# Patient Record
Sex: Male | Born: 1935 | Race: White | Hispanic: No | Marital: Married | Smoking: Never smoker
Health system: Southern US, Community
[De-identification: ages and names within clinical notes are randomized; demographics above are authoritative.]

## PROBLEM LIST (undated history)

## (undated) ENCOUNTER — Emergency Department (HOSPITAL_COMMUNITY): Admission: EM | Payer: Medicare Other

## (undated) DIAGNOSIS — Z9889 Other specified postprocedural states: Secondary | ICD-10-CM

## (undated) DIAGNOSIS — A481 Legionnaires' disease: Secondary | ICD-10-CM

## (undated) DIAGNOSIS — Z7901 Long term (current) use of anticoagulants: Secondary | ICD-10-CM

## (undated) DIAGNOSIS — J189 Pneumonia, unspecified organism: Secondary | ICD-10-CM

## (undated) DIAGNOSIS — I200 Unstable angina: Secondary | ICD-10-CM

## (undated) DIAGNOSIS — I10 Essential (primary) hypertension: Secondary | ICD-10-CM

## (undated) DIAGNOSIS — R0609 Other forms of dyspnea: Secondary | ICD-10-CM

## (undated) DIAGNOSIS — I350 Nonrheumatic aortic (valve) stenosis: Secondary | ICD-10-CM

## (undated) DIAGNOSIS — I2510 Atherosclerotic heart disease of native coronary artery without angina pectoris: Secondary | ICD-10-CM

## (undated) DIAGNOSIS — R011 Cardiac murmur, unspecified: Secondary | ICD-10-CM

## (undated) DIAGNOSIS — R112 Nausea with vomiting, unspecified: Secondary | ICD-10-CM

## (undated) DIAGNOSIS — I209 Angina pectoris, unspecified: Secondary | ICD-10-CM

## (undated) DIAGNOSIS — N200 Calculus of kidney: Secondary | ICD-10-CM

## (undated) DIAGNOSIS — R0600 Dyspnea, unspecified: Secondary | ICD-10-CM

## (undated) DIAGNOSIS — I2699 Other pulmonary embolism without acute cor pulmonale: Secondary | ICD-10-CM

## (undated) DIAGNOSIS — C61 Malignant neoplasm of prostate: Secondary | ICD-10-CM

## (undated) DIAGNOSIS — T4145XA Adverse effect of unspecified anesthetic, initial encounter: Secondary | ICD-10-CM

## (undated) DIAGNOSIS — I739 Peripheral vascular disease, unspecified: Secondary | ICD-10-CM

## (undated) HISTORY — PX: TYMPANIC MEMBRANE REPAIR: SHX294

## (undated) HISTORY — PX: MASTOIDECTOMY: SHX711

## (undated) HISTORY — PX: OTHER SURGICAL HISTORY: SHX169

---

## 1984-07-24 DIAGNOSIS — A481 Legionnaires' disease: Secondary | ICD-10-CM

## 1984-07-24 HISTORY — DX: Legionnaires' disease: A48.1

## 1992-07-24 HISTORY — PX: CORONARY ANGIOPLASTY: SHX604

## 1997-07-24 DIAGNOSIS — T8859XA Other complications of anesthesia, initial encounter: Secondary | ICD-10-CM

## 1997-07-24 HISTORY — PX: PROSTATE SURGERY: SHX751

## 1997-07-24 HISTORY — DX: Other complications of anesthesia, initial encounter: T88.59XA

## 1997-11-18 ENCOUNTER — Encounter: Admission: RE | Admit: 1997-11-18 | Discharge: 1998-02-16 | Payer: Self-pay | Admitting: Radiation Oncology

## 1999-07-25 HISTORY — PX: CHOLECYSTECTOMY: SHX55

## 1999-12-08 ENCOUNTER — Ambulatory Visit (HOSPITAL_COMMUNITY): Admission: RE | Admit: 1999-12-08 | Discharge: 1999-12-09 | Payer: Self-pay | Admitting: Cardiovascular Disease

## 1999-12-08 ENCOUNTER — Encounter: Payer: Self-pay | Admitting: Cardiovascular Disease

## 2000-02-07 ENCOUNTER — Encounter: Payer: Self-pay | Admitting: Emergency Medicine

## 2000-02-07 ENCOUNTER — Inpatient Hospital Stay (HOSPITAL_COMMUNITY): Admission: EM | Admit: 2000-02-07 | Discharge: 2000-02-08 | Payer: Self-pay | Admitting: Emergency Medicine

## 2000-02-12 ENCOUNTER — Encounter: Payer: Self-pay | Admitting: Emergency Medicine

## 2000-02-12 ENCOUNTER — Inpatient Hospital Stay (HOSPITAL_COMMUNITY): Admission: EM | Admit: 2000-02-12 | Discharge: 2000-02-16 | Payer: Self-pay | Admitting: Cardiovascular Disease

## 2000-02-12 ENCOUNTER — Encounter (INDEPENDENT_AMBULATORY_CARE_PROVIDER_SITE_OTHER): Payer: Self-pay | Admitting: Specialist

## 2000-02-13 ENCOUNTER — Encounter: Payer: Self-pay | Admitting: Cardiovascular Disease

## 2000-02-23 ENCOUNTER — Ambulatory Visit (HOSPITAL_COMMUNITY): Admission: RE | Admit: 2000-02-23 | Discharge: 2000-02-23 | Payer: Self-pay | Admitting: General Surgery

## 2000-02-23 ENCOUNTER — Emergency Department (HOSPITAL_COMMUNITY): Admission: EM | Admit: 2000-02-23 | Discharge: 2000-02-23 | Payer: Self-pay | Admitting: Emergency Medicine

## 2000-02-23 ENCOUNTER — Encounter: Payer: Self-pay | Admitting: General Surgery

## 2002-08-11 ENCOUNTER — Encounter: Admission: RE | Admit: 2002-08-11 | Discharge: 2002-08-11 | Payer: Self-pay | Admitting: Urology

## 2002-08-11 ENCOUNTER — Encounter: Payer: Self-pay | Admitting: Urology

## 2002-08-14 ENCOUNTER — Encounter: Admission: RE | Admit: 2002-08-14 | Discharge: 2002-08-14 | Payer: Self-pay | Admitting: Urology

## 2002-08-14 ENCOUNTER — Encounter: Payer: Self-pay | Admitting: Urology

## 2004-07-24 HISTORY — PX: LITHOTRIPSY: SUR834

## 2005-01-11 ENCOUNTER — Inpatient Hospital Stay (HOSPITAL_COMMUNITY): Admission: EM | Admit: 2005-01-11 | Discharge: 2005-01-13 | Payer: Self-pay | Admitting: Emergency Medicine

## 2005-04-03 ENCOUNTER — Ambulatory Visit (HOSPITAL_COMMUNITY): Admission: RE | Admit: 2005-04-03 | Discharge: 2005-04-03 | Payer: Self-pay | Admitting: Urology

## 2005-11-17 ENCOUNTER — Emergency Department (HOSPITAL_COMMUNITY): Admission: EM | Admit: 2005-11-17 | Discharge: 2005-11-17 | Payer: Self-pay | Admitting: Emergency Medicine

## 2008-07-30 ENCOUNTER — Emergency Department (HOSPITAL_COMMUNITY): Admission: EM | Admit: 2008-07-30 | Discharge: 2008-07-30 | Payer: Self-pay | Admitting: Emergency Medicine

## 2008-07-30 ENCOUNTER — Emergency Department (HOSPITAL_COMMUNITY): Admission: EM | Admit: 2008-07-30 | Discharge: 2008-07-31 | Payer: Self-pay | Admitting: Emergency Medicine

## 2008-08-28 ENCOUNTER — Inpatient Hospital Stay (HOSPITAL_COMMUNITY): Admission: EM | Admit: 2008-08-28 | Discharge: 2008-08-30 | Payer: Self-pay | Admitting: Emergency Medicine

## 2008-09-23 ENCOUNTER — Encounter: Payer: Self-pay | Admitting: Pulmonary Disease

## 2008-10-28 ENCOUNTER — Ambulatory Visit (HOSPITAL_COMMUNITY): Admission: RE | Admit: 2008-10-28 | Discharge: 2008-10-28 | Payer: Self-pay | Admitting: Pulmonary Disease

## 2008-10-28 ENCOUNTER — Encounter: Payer: Self-pay | Admitting: Pulmonary Disease

## 2008-10-28 ENCOUNTER — Ambulatory Visit: Payer: Self-pay | Admitting: Vascular Surgery

## 2008-10-28 ENCOUNTER — Ambulatory Visit: Payer: Self-pay | Admitting: Pulmonary Disease

## 2008-10-28 DIAGNOSIS — I2699 Other pulmonary embolism without acute cor pulmonale: Secondary | ICD-10-CM

## 2008-10-28 DIAGNOSIS — I2789 Other specified pulmonary heart diseases: Secondary | ICD-10-CM

## 2008-10-28 DIAGNOSIS — E785 Hyperlipidemia, unspecified: Secondary | ICD-10-CM

## 2008-10-28 DIAGNOSIS — I10 Essential (primary) hypertension: Secondary | ICD-10-CM

## 2008-11-02 ENCOUNTER — Ambulatory Visit: Payer: Self-pay | Admitting: Oncology

## 2008-11-13 LAB — CBC WITH DIFFERENTIAL/PLATELET
BASO%: 0.6 % (ref 0.0–2.0)
Basophils Absolute: 0 10e3/uL (ref 0.0–0.1)
EOS%: 2.2 % (ref 0.0–7.0)
Eosinophils Absolute: 0.1 10e3/uL (ref 0.0–0.5)
HCT: 43.5 % (ref 38.4–49.9)
HGB: 15.2 g/dL (ref 13.0–17.1)
LYMPH%: 25.3 % (ref 14.0–49.0)
MCH: 32.5 pg (ref 27.2–33.4)
MCHC: 35 g/dL (ref 32.0–36.0)
MCV: 92.9 fL (ref 79.3–98.0)
MONO#: 0.4 10e3/uL (ref 0.1–0.9)
MONO%: 8.4 % (ref 0.0–14.0)
NEUT#: 3.3 10e3/uL (ref 1.5–6.5)
NEUT%: 63.5 % (ref 39.0–75.0)
Platelets: 168 10e3/uL (ref 140–400)
RBC: 4.68 10e6/uL (ref 4.20–5.82)
RDW: 13.2 % (ref 11.0–14.6)
WBC: 5.2 10e3/uL (ref 4.0–10.3)
lymph#: 1.3 10e3/uL (ref 0.9–3.3)

## 2008-11-16 LAB — PROTEIN C ACTIVITY: Protein C Activity: <15 % (ref 75–133)

## 2008-11-16 LAB — PROTEIN C, TOTAL: Protein C, Total: 57 % — ABNORMAL LOW (ref 70–140)

## 2008-12-25 ENCOUNTER — Encounter: Admission: RE | Admit: 2008-12-25 | Discharge: 2008-12-25 | Payer: Self-pay | Admitting: Family Medicine

## 2009-07-09 ENCOUNTER — Emergency Department (HOSPITAL_COMMUNITY): Admission: EM | Admit: 2009-07-09 | Discharge: 2009-07-09 | Payer: Self-pay | Admitting: Emergency Medicine

## 2009-09-27 ENCOUNTER — Ambulatory Visit (HOSPITAL_COMMUNITY): Admission: RE | Admit: 2009-09-27 | Discharge: 2009-09-27 | Payer: Self-pay | Admitting: Gastroenterology

## 2010-05-19 ENCOUNTER — Encounter: Admission: RE | Admit: 2010-05-19 | Discharge: 2010-05-19 | Payer: Self-pay | Admitting: Family Medicine

## 2010-10-17 LAB — COMPREHENSIVE METABOLIC PANEL
Alkaline Phosphatase: 46 U/L (ref 39–117)
BUN: 13 mg/dL (ref 6–23)
Chloride: 106 mEq/L (ref 96–112)
Creatinine, Ser: 1.02 mg/dL (ref 0.4–1.5)
Glucose, Bld: 129 mg/dL — ABNORMAL HIGH (ref 70–99)
Potassium: 4 mEq/L (ref 3.5–5.1)
Total Bilirubin: 1.1 mg/dL (ref 0.3–1.2)
Total Protein: 7.1 g/dL (ref 6.0–8.3)

## 2010-10-17 LAB — PROTIME-INR
INR: 1.12 (ref 0.00–1.49)
Prothrombin Time: 14.3 seconds (ref 11.6–15.2)

## 2010-10-17 LAB — TYPE AND SCREEN: ABO/RH(D): AB POS

## 2010-10-17 LAB — CBC
HCT: 45.7 % (ref 39.0–52.0)
Hemoglobin: 16 g/dL (ref 13.0–17.0)
MCHC: 34.9 g/dL (ref 30.0–36.0)
RBC: 4.76 MIL/uL (ref 4.22–5.81)
RDW: 11.8 % (ref 11.5–15.5)

## 2010-10-17 LAB — DIFFERENTIAL
Basophils Relative: 1 % (ref 0–1)
Eosinophils Absolute: 0.1 10*3/uL (ref 0.0–0.7)
Neutro Abs: 2.4 10*3/uL (ref 1.7–7.7)
Neutrophils Relative %: 59 % (ref 43–77)

## 2010-10-24 LAB — DIFFERENTIAL
Basophils Absolute: 0 10*3/uL (ref 0.0–0.1)
Basophils Relative: 0 % (ref 0–1)
Eosinophils Absolute: 0.1 10*3/uL (ref 0.0–0.7)
Eosinophils Relative: 1 % (ref 0–5)
Monocytes Absolute: 0.7 10*3/uL (ref 0.1–1.0)

## 2010-10-24 LAB — CBC
HCT: 35.8 % — ABNORMAL LOW (ref 39.0–52.0)
Hemoglobin: 12.4 g/dL — ABNORMAL LOW (ref 13.0–17.0)
MCHC: 34.8 g/dL (ref 30.0–36.0)
MCV: 96.9 fL (ref 78.0–100.0)
RDW: 12.4 % (ref 11.5–15.5)

## 2010-11-07 LAB — BASIC METABOLIC PANEL
BUN: 15 mg/dL (ref 6–23)
Calcium: 9.1 mg/dL (ref 8.4–10.5)
Creatinine, Ser: 1.21 mg/dL (ref 0.4–1.5)
GFR calc non Af Amer: 59 mL/min — ABNORMAL LOW (ref >60)
Glucose, Bld: 181 mg/dL — ABNORMAL HIGH (ref 70–99)
Potassium: 4.1 mEq/L (ref 3.5–5.1)

## 2010-11-07 LAB — URINE MICROSCOPIC-ADD ON

## 2010-11-07 LAB — DIFFERENTIAL
Basophils Absolute: 0 10*3/uL (ref 0.0–0.1)
Basophils Relative: 0 % (ref 0–1)
Eosinophils Relative: 1 % (ref 0–5)
Monocytes Absolute: 0.7 10*3/uL (ref 0.1–1.0)
Neutro Abs: 5.8 10*3/uL (ref 1.7–7.7)

## 2010-11-07 LAB — URINE CULTURE

## 2010-11-07 LAB — URINALYSIS, ROUTINE W REFLEX MICROSCOPIC
Glucose, UA: 100 mg/dL — AB
Hgb urine dipstick: NEGATIVE
Specific Gravity, Urine: 1.027 (ref 1.005–1.030)
Urobilinogen, UA: 1 mg/dL (ref 0.0–1.0)

## 2010-11-07 LAB — CBC
Hemoglobin: 15.4 g/dL (ref 13.0–17.0)
MCHC: 34.1 g/dL (ref 30.0–36.0)
Platelets: 163 10*3/uL (ref 150–400)
RDW: 12.3 % (ref 11.5–15.5)

## 2010-11-07 LAB — POCT CARDIAC MARKERS: CKMB, poc: <1 ng/mL — ABNORMAL LOW (ref 1.0–8.0)

## 2010-11-08 LAB — CBC
HCT: 40.7 % (ref 39.0–52.0)
HCT: 46.9 % (ref 39.0–52.0)
Hemoglobin: 16.3 g/dL (ref 13.0–17.0)
MCHC: 34.7 g/dL (ref 30.0–36.0)
MCV: 95.3 fL (ref 78.0–100.0)
Platelets: 121 10*3/uL — ABNORMAL LOW (ref 150–400)
RBC: 4.93 MIL/uL (ref 4.22–5.81)
RDW: 12.4 % (ref 11.5–15.5)
WBC: 4.4 10*3/uL (ref 4.0–10.5)
WBC: 6 10*3/uL (ref 4.0–10.5)

## 2010-11-08 LAB — DIFFERENTIAL
Basophils Relative: 0 % (ref 0–1)
Eosinophils Absolute: 0.1 10*3/uL (ref 0.0–0.7)
Eosinophils Relative: 2 % (ref 0–5)
Lymphs Abs: 1 10*3/uL (ref 0.7–4.0)
Monocytes Absolute: 0.5 10*3/uL (ref 0.1–1.0)
Monocytes Relative: 9 % (ref 3–12)

## 2010-11-08 LAB — LIPID PANEL
Cholesterol: 138 mg/dL (ref 0–200)
HDL: 38 mg/dL — ABNORMAL LOW (ref >39)
LDL Cholesterol: 88 mg/dL (ref 0–99)
Total CHOL/HDL Ratio: 3.6 RATIO
Triglycerides: 60 mg/dL (ref <150)

## 2010-11-08 LAB — URINE CULTURE: Colony Count: NO GROWTH

## 2010-11-08 LAB — PROTIME-INR
INR: 1.2 (ref 0.00–1.49)
Prothrombin Time: 16.7 seconds — ABNORMAL HIGH (ref 11.6–15.2)

## 2010-11-08 LAB — URINALYSIS, ROUTINE W REFLEX MICROSCOPIC
Bilirubin Urine: NEGATIVE
Hgb urine dipstick: NEGATIVE
Nitrite: NEGATIVE
Specific Gravity, Urine: 1.01 (ref 1.005–1.030)
Urobilinogen, UA: 0.2 mg/dL (ref 0.0–1.0)
pH: 6 (ref 5.0–8.0)

## 2010-11-08 LAB — PROTEIN S, TOTAL: Protein S Ag, Total: 97 % (ref 70–140)

## 2010-11-08 LAB — POCT CARDIAC MARKERS
CKMB, poc: <1 ng/mL — ABNORMAL LOW (ref 1.0–8.0)
Troponin i, poc: <0.05 ng/mL (ref 0.00–0.09)
Troponin i, poc: <0.05 ng/mL (ref 0.00–0.09)

## 2010-11-08 LAB — COMPREHENSIVE METABOLIC PANEL
ALT: 19 U/L (ref 0–53)
AST: 19 U/L (ref 0–37)
Calcium: 8.7 mg/dL (ref 8.4–10.5)
GFR calc Af Amer: >60 mL/min (ref >60)
Sodium: 138 mEq/L (ref 135–145)
Total Protein: 5.9 g/dL — ABNORMAL LOW (ref 6.0–8.3)

## 2010-11-08 LAB — CARDIOLIPIN ANTIBODIES, IGG, IGM, IGA: Anticardiolipin IgA: 9 [APL'U] — ABNORMAL LOW (ref <13)

## 2010-11-08 LAB — POCT I-STAT, CHEM 8
Chloride: 103 mEq/L (ref 96–112)
Creatinine, Ser: 1.1 mg/dL (ref 0.4–1.5)
Glucose, Bld: 108 mg/dL — ABNORMAL HIGH (ref 70–99)
Hemoglobin: 16.3 g/dL (ref 13.0–17.0)
Potassium: 4.5 mEq/L (ref 3.5–5.1)
Sodium: 136 mEq/L (ref 135–145)

## 2010-11-08 LAB — PROTEIN C ACTIVITY: Protein C Activity: 129 % (ref 75–133)

## 2010-11-08 LAB — CARDIAC PANEL(CRET KIN+CKTOT+MB+TROPI)
CK, MB: 1.6 ng/mL (ref 0.3–4.0)
CK, MB: 1.9 ng/mL (ref 0.3–4.0)
Relative Index: INVALID (ref 0.0–2.5)
Relative Index: INVALID (ref 0.0–2.5)
Troponin I: 0.03 ng/mL (ref 0.00–0.06)

## 2010-11-08 LAB — BETA-2-GLYCOPROTEIN I ABS, IGG/M/A
Beta-2 Glyco I IgG: <4 U/mL (ref <20)
Beta-2-Glycoprotein I IgA: <4 U/mL (ref <10)
Beta-2-Glycoprotein I IgM: <4 U/mL (ref <10)

## 2010-11-08 LAB — PROTEIN C, TOTAL: Protein C, Total: 64 % — ABNORMAL LOW (ref 70–140)

## 2010-11-08 LAB — FACTOR 5 LEIDEN

## 2010-11-08 LAB — CK TOTAL AND CKMB (NOT AT ARMC): Relative Index: INVALID (ref 0.0–2.5)

## 2010-11-08 LAB — LUPUS ANTICOAGULANT PANEL: Lupus Anticoagulant: NOT DETECTED

## 2010-11-08 LAB — ANTITHROMBIN III: AntiThromb III Func: 209 % — ABNORMAL HIGH (ref 76–126)

## 2010-11-08 LAB — PROTEIN S ACTIVITY: Protein S Activity: 126 % (ref 69–129)

## 2010-12-06 NOTE — Discharge Summary (Signed)
Curtis Brennan, Curtis Brennan              ACCOUNT NO.:  [id number]   MEDICAL RECORD NO.:  [id number]          PATIENT TYPE:  INP   LOCATION:  4711                         FACILITY:  [facility]   PHYSICIAN:  Curtis Brennan, M.D. DATE OF BIRTH:  11/10/1931   DATE OF ADMISSION:  08/28/2008  DATE OF DISCHARGE:  08/30/2008                               DISCHARGE SUMMARY   PRIMARY CARE PHYSICIAN:  Curtis Puffer, MD in [place], [place].   DISCHARGE DIAGNOSES:  1. A large clot burden.  2. Bilateral pulmonary embolism.  3. Right ear infection.  4. Coronary artery disease.  5. Hypertension.  6. Hyperlipidemia.  7. Status post cholecystectomy.  8. History of prostate cancer diagnosed in 1988.  9. Status post prostatectomy in 1999.   DISCHARGE MEDICATIONS:  1. Amoxicillin 500 mg twice daily until September 04, 2008.  2. Coumadin 7.5 mg daily until advised differently by Dr. Clarene Brennan.  3. Lovenox 150 mg injected subcutaneously daily until at least      September 01, 2008, or longer as instructed by Dr. Clarene Brennan.  4. Aspirin 81 mg daily.  5. Felodipine ER 10 mg daily.  6. Lisinopril 10 mg daily.  7. Metoprolol 25 mg daily.  8. Tramadol 50 mg every 6 hours as needed for pain.   DISPOSITION AND FOLLOW UP:  The patient is discharged home in stable  condition.  He has maintained good oxygenation and blood pressures while  in the hospital.  He has been advised to call Dr. Clarene Brennan on Monday for  followup appointment as well as for an INR check on Tuesday at the  latest.  His phone number has been provided to the patient in the  discharge instructions.  We will need to be on the Lovenox for at least  5 days overlap and this is to be concluded on Tuesday September 01, 2008.  He may need to be on the Lovenox longer if his INR remained  subtherapeutic.   CONSULTATION:  In this hospitalization, Dr. Ritta Brennan with  [facility].   The patient returned to disposition and followup  at the end and state  that a hypercoagulable panel is pending at time of dictation and should  be followed up by his primary care physician.   Images in this hospitalization include:  1. Chest x-ray on August 28, 2008, that showed a stable mild changes      of COPD and chronic bronchitis.  2. A CT scan of the head on August 28, 2008, that showed bilateral      chronic mastoiditis and chronic otitis media, greater on the right.  3. CT of the orbits on August 28, 2008, again showing the bilateral      chronic mastoiditis and chronic otitis media.  4. A CT angiogram of the chest on August 28, 2008, that shows a      bilateral pulmonary emboli involving branches of the right upper,      middle, and lower lobe pulmonary arteries and the left lower lobe      pulmonary artery.  Clot burden is large.  Numerous  calcified      granulomata throughout both lungs and numerous calcified      mediastinal and hilar lymph nodes consistent with old granulomatous      disease, and a small hiatal hernia.   HISTORY AND PHYSICAL EXAMINATION:  For full details, please refer to  history and physical dictated by Dr. Oralia Brennan on August 28, 2008, but in  brief, Curtis Brennan is a 77 year old Caucasian man who has a history of  coronary artery disease, status post of PTCA who presented with  dizziness, headache, and shortness of breath.  Started with a headache  day prior to admission and then developed a shortness of breath with  exertion.  He did have some chills.  He became concerned, for that  reason he was brought into the hospital.   HOSPITAL COURSE:  Back to problem:  1. PE, which was demonstrated by CT angiogram of the chest.  He was      started on heparin drip as well as on Coumadin.  His heparin has      been transitioned over to Lovenox for ease of administration upon      discharge.  Please note that he must complete 5 days of overlap for      the venous thromboembolism core measure.  This is to be  completed      on Tuesday, September 01, 2008.  He may need a Coumadin for a longer      period of time, depending on his INRs.  His INR upon discharge is      1.3.  He will follow up with Dr. Aida Brennan on Tuesday at the      latest.  He has been provided with instructions on his discharge      sheet.  2. He does have chronic bilateral mastoiditis, an ear infection that      is worse on the right with drainage.  I had started him on      amoxicillin 500 mg twice a day.  This is to be completed on      September 04, 2008.  3. Rest of chronic medical issues were not a problem in this      hospitalization and none of his medications were changed.  4. Vital signs upon discharge, blood pressure 138/90, heart rate 80,      respirations 20, and O2 sats 99% on room air with a temperature of      97.2.   LABS ON DAY OF DISCHARGE:  WBCs 4.4, hemoglobin 14.2, and platelets of  121.      Curtis Brennan, M.D.  Electronically Signed     EH/MEDQ  D:  08/30/2008  T:  08/30/2008  Job:  [id number]   cc:   Nanetta Batty, M.D.

## 2010-12-06 NOTE — H&P (Signed)
NAME:  Curtis Brennan, Curtis Brennan              ACCOUNT NO.:  [id number]   MEDICAL RECORD NO.:  [id number]          PATIENT TYPE:  INP   LOCATION:  1843                         FACILITY:  [facility]   PHYSICIAN:  Darryl D. Prime, MD    DATE OF BIRTH:  23-Jun-1932   DATE OF ADMISSION:  08/28/2008  DATE OF DISCHARGE:                              HISTORY & PHYSICAL   PRIMARY CARE PHYSICIAN:  Dr. Aida Puffer.   CARDIOLOGIST:  Nanetta Batty, M.D.   Patient is the historian as well as his wife and daughters.  They were  good historians.   CHIEF COMPLAINT:  Dizziness, headache, and shortness of breath.   HISTORY OF PRESENT ILLNESS:  Curtis Brennan is a 77 year old male with a  history of coronary artery disease, status post PCI, history of  hypertension, who presents with the above symptoms.  The patient notes  he had a headache yesterday that was significant and then subsequently  developed shortness of breath, particularly with exertion, which is new  today.  He has been relatively sedentary due to the weather here in the  winter, it has been snowing.  Three weeks ago, he did travel, took a 4-  hour trip, but notes he got up periodically out of the car to stretch.   The patient on presentation was concerned more about his shortness of  breath.  He has had mild chest pain, however.  He notes recent chills.  He denies any recent weight loss.  He has a history of chronic  mastoiditis, and his right ear has been draining.  The patient notes his  appetite has been good.  He denies any black stools or bloody stools.  He denies any recent dysuria or hematuria.   The patient is taking his medications; however, he did not bring his  list with him today.   PAST MEDICAL HISTORY:  1. History of coronary artery disease, status post direct LAD      arthrectomy in 1995.  He is also status post balloon angioplasty of      a diagonal branch in 2001.  2. He is also status post cholecystectomy in 2001.  3.  Hypertension.  4. History of kidney stones.  5. History of Legionnaire's disease, pneumonia in 1996.  6. History of chronic mastoiditis since childhood bilaterally,      apparently with recent right ear drainage.  7. History of prostate cancer diagnosed in 1998.  He notes he had a      prostatectomy in 1999.  He had 34 doses of radiation therapy as      well.  8. Hyperlipidemia.   ALLERGIES:  He is allergic SULFA DRUGS.  He is unsure of the reaction.   MEDICATIONS:  He did not bring his list with him.  He has been listed to  be on:  1. Metoprolol.  2. Lisinopril.  3. Felodipine.  4. Baby aspirin.   We will have to confirm his medication list.   SOCIAL HISTORY:  He has never smoked.  No alcohol.  No illicit drug use.  He is retired.  He was previously  self-employed.   FAMILY HISTORY:  Positive for clots that went to the lung in 2  sisters, although I cannot confirm this.   REVIEW OF SYSTEMS:  The 14-point review of systems is negative unless  stated above.   PHYSICAL EXAMINATION:  VITAL SIGNS:  Temperature 98, initial blood  pressure 189/87, pulse 99 to 108, respiratory rate 20 to 22, sats are  95% on 2 liters nasal cannula.  It did drop to 81% on room air.  GENERAL:  He is acutely ill-appearing.  Looks comfortable.  HEENT:  Normocephalic and atraumatic.  Pupils are equal, round and  reactive to light.  Extraocular movements are intact.  Oropharynx  reveals preserved dentition with no posterior pharyngeal lesions.  He is  dry.  NECK:  Supple.  No lymphadenopathy or thyromegaly.  No carotid bruits.  No jugular venous distention.  LUNGS:  Clear to auscultation bilaterally.  CARDIOVASCULAR:  Regular rhythm with a fast rate.  No murmurs, rubs or  gallops.  Normal S1 and S2.  No S3 or S4.  ABDOMEN:  Soft, nontender, nondistended.  No hepatosplenomegaly.  EXTREMITIES:  No clubbing, cyanosis or edema.  NEUROLOGIC:  He is alert and oriented x4 with cranial nerves II-XII  grossly  intact.  Strength and sensation are grossly intact.  SKIN:  No rashes.   LABORATORY DATA:  White count is 6 with hemoglobin of 16.3, hematocrit  46.9, platelets 146, segs 73%.  Cardiac markers drawn in the emergency  room are unremarkable at 1607.  INR was 1.  PT 13.3.  PTT 30.  Urinalysis was negative.  B-type natriuretic peptide less than 30.  I-  STAT showed a CO2 of 24, hemoglobin 16.3, hematocrit 48.  Sodium 136,  potassium 4.5, chloride 103, glucose 108, BUN 16, creatinine 1.1.  Cardiac markers at 1750 were unremarkable.  D-dimer was high at 7.91,  which prompted a CT of the chest.  Preliminary read shows he has  bilateral pulmonary emboli involving branches of the right upper,  middle, and lower pulmonary arteries, and the left lower pulmonary  artery with a large clot burden.  He had noncalcified granular matter in  both lungs, numerous calcified mediastinal and hilar nodes, consistent  with old granulomatous disease.  Superimposed active infection and/or  malignancy could not be excluded, as he did have also enlarged bilateral  hilar and subcarinal mediastinal lymph nodes.  He had a small hiatal  hernia and pancreatic atrophy.   Patient also had a CT of the head because he came in with a headache.  It showed mild diffuse cerebral and cerebellar atrophy, mild chronic  small vessel white matter ischemic changes in both cerebral hemispheres  and bilateral chronic mastoiditis.   The patient's EKG shows normal sinus rhythm at a rate of 96 beats per  minute, P-R interval 188, QRS 118.  Q-T corrected at 490.  There is a  incomplete right bundle branch block.  The right bundle is new, compared  to EKG in June, 2006.   Patient's chest x-ray shows mild changes consistent with chronic  bronchitis.   ASSESSMENT/PLAN:  1. This is a patient with a history of coronary artery disease who has      no glaring precipitant for bilateral pulmonary emboli.  Does have a      history of prostate  cancer, and apparently 3 weeks ago, PSA was      checked, and this is unremarkable.  We will check this again,  however, as the concern here is for possible malignancy causing      this.  His recent sedentary state could also be the etiology.  He      was seen by cardiology here, and they have ordered a coagulation      panel.  He will be on a heparin drip.  Coumadin will be started      eventually.  The patient will most likely need at least 6 months of      this therapy.  He will be observed closely, as he did have oxygen      saturations here in the emergency room on telemetry.  We will get      an echocardiogram to evaluate his right ventricular function      specifically.  We will give a low-dose beta blocker to keep him on      his anti-ischemic therapy and give him hydralazine as needed.  He      will receive gentle hydration.  2. Deep venous thrombosis prophylaxis:  Again, he has had a pulmonary      emboli.  Gastrointestinal prophylaxis with Zantac for a proton pump      inhibitor.      Darryl D. Prime, MD  Electronically Signed     DDP/MEDQ  D:  08/28/2008  T:  08/28/2008  Job:  [id number]

## 2010-12-09 NOTE — Consult Note (Signed)
[place]  Patient:    Curtis Brennan, Curtis Brennan                       MRN: [id number] Proc. Date: 02/13/00 Adm. Date:  [id number] Attending:  Berry, Jonathan [place] CC:         [place]. Andrey Campanile, M.D.; Nanetta Batty, M.D.                          Consultation Report  REASON FOR CONSULTATION:  Asked to see [name], a 77 year old white male, by Dr. Nanetta Batty to evaluate chest pain in this patient with known gallstone disease.  CHIEF COMPLAINT:  Substernal chest pain.  HISTORY OF PRESENT ILLNESS:  The patient has known coronary artery disease which has been extensively evaluated and treated over the past 6 years.  PTCA with stent placement was performed in 1995.  He was recently found to have anterolateral ischemia on Cardiolite in mid-May.  Catheterization revealed subtotal occlusion which was again angioplastied with good result.  Subsequent Cardiolite examination was negative.  One week ago the patient was admitted with recurrent substernal chest pain described as hard, intense, well localized without radiation, which was different from prior cardiac-related pains which radiated across both the right and left precordium.  Repeat catheterization last week revealed no critical disease.  The patient was discharged and over the past week did well until yesterday following his nighttime meal.  Approximately 15-20 minutes after eating a vegetable dinner he experienced recurrent substernal chest pain identical to that which he had been experiencing last week.  He took an analgesic from his nephew and came to the emergency room.  By the time he was seen in the emergency room his pain was subsiding.  He was discharged home only to have pain recur approximately four hours later.  This time it was much more significant and he returned to the emergency room mid-morning.  The pain lasted approximately 3-4 hours and finally subsided at 5  a.m.  Cardiac evaluation revealed no EKG changes.  Initial enzymes were negative. On physical examination he was found to have right upper quadrant tenderness.  The patient presently feels well and has felt well since 5 a.m. this morning, approximately 12 hours.  No nausea or vomiting, fair appetite.  Eating dinner without difficulty.  The patient denies symptoms of odynophagia, dysphagia, or pyrosis.  No dyspepsia or abdominal pain.  Takes no H2RA, no PPI therapy.  Risk factors include aspirin 81 mg daily, but no NSAID, alcohol, or tobacco use.  He has lost 30 pounds over the past 15 weeks with voluntary dieting.  He follows a vegetarian diet and avoids all meat because of his coronary artery disease.  In addition, the patient notes no jaundice or icterus.  Bowel habits remain regular.  One formed stool daily.  No hematochezia, melena, or hematemesis.  No fever, rigor, or chill.  There is no family history of peptic ulcer disease, gallbladder disease or other GI abnormality.  PAST MEDICAL HISTORY:  1. Coronary artery disease.  2. Prostate cancer - treated with surgical prostatectomy performed by Dr. Alexis Frock, followed by 34 doses of radiation therapy in 1998.  3. Hypertension.  4. Hyperlipidemia.  5. Cholelithiasis - diagnosed when patient was asymptomatic at the time of     prostate cancer evaluation.  CURRENT MEDICAL REGIMEN:  1. Ecotrin 81 mg daily.  2. Norvasc  10 mg daily.  3. Lopressor 25 mg b.i.d.  4. Baycol 0.4 mg daily.  5. Begun on Prevacid 30 mg daily with present admission.  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  The patient is retired.  Enjoys fixing around the home.  Remains active.  Married.  FAMILY HISTORY:  Father of three daughters, seven grandchildren, two great-grandchildren - all healthy.  REVIEW OF SYSTEMS:  Noncontributory.  ALLERGIES:   SULFA.  PHYSICAL EXAMINATION:  GENERAL:  Healthy-appearing white male, alert and oriented, in no acute distress.  Appears stated age.  VITAL SIGNS:  Stable.  HEENT:  Anicteric sclerae.  Pink conjunctivae.  EOMI.  PERRL.  Mouth:  Upper dentures, poor dentition, in fair repair.  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  NECK:  Supple.  No adenopathy.  No thyromegaly or bruit.  He has 2+ bilateral carotid bruit.  CHEST:  Clear to auscultation.  CARDIAC:  Regular rhythm.  No gallop.  No murmur appreciated.  ABDOMEN:  Minimal right upper quadrant = epigastric tenderness without rebound or guarding.  No palpable organomegaly.  No mass or firmness.  Bowel sounds active.  No tenderness to fist percussion over the costal margins.  Bowel sounds active.  RECTAL:  Not performed.  EXTREMITIES:  No clubbing, cyanosis or edema.  SKIN:  No lesion except for recent eschar over the forearm due to traumatic injury.  LABORATORY:  No HFP, amylase, lipase to review.  Urinalysis not performed.  X-rays:  Abdominal ultrasound - report of small contracted gallbladder filled with multiple small stones.  Not officially read yet.  EKG:  No acute changes.  ASSESSMENT:  1. Substernal pain - symptoms may well be related to known cholelithiasis. Initially documented in 1998 and again confirmed on repeat ultrasound today. The postprandial onset of symptoms duration greater than two hours, relieved with analgesics, mild epigastric/right upper quadrant tenderness on physical examination, are all consistent.  Risk factors include 30-pound weight loss, with a significant weight reduction being lithogenic.  Atypical is the substernal location, lack of associated nausea and vomiting, and rapid resolution of symptoms with patient eating a normal meal within 12 hours without difficulty.  Nevertheless, gallstone disease needs to remain the primary suspect etiology at this point.  Other considerations include coronary  artery disease which appears to be eliminated with recent extensive evaluation, negative electrocardiogram and  enzymes; peptic ulcer disease - at risk because of ongoing aspirin therapy though no symptoms of dyspepsia.  The patient has been placed on Prevacid empirically which I agree with; kidney stones - atypical location.  No objective evidence to support; pancreatitis - No risk factors and, again, location is a bit atypical as his rapid resolution of symptoms eating a normal meal within 12 hours.   2. Coronary artery disease - Appears stable.   3. Hypertension.   4. Hyperlipidemia.  RECOMMENDATIONS:  1. Laboratory HFP, amylase, lipase, UA, hemoccult.  2. If Curtis Brennan is a surgical candidate, then I believe surgical     consultation should be obtained to consider laparoscopic cystectomy.  3. Continue Prevacid prophylactically.  4. Fat free diet.DD:  02/13/00 TD:  02/14/00 Job: [id number]

## 2010-12-09 NOTE — Op Note (Signed)
[place]  Patient:    Curtis Brennan, Curtis Brennan                       MRN: [id number] Proc. Date: 02/15/00 Adm. Date:  [id number] Disc. Date: [id number] Attending:  Doug Sou CC:         Runell Gess, M.D.             Griffith Citron, M.D.                           Operative Report  PREOPERATIVE DIAGNOSIS:  Chronic cholecystitis with cholelithiasis.  POSTOPERATIVE DIAGNOSIS:  Subacute and chronic cholecystitis with cholelithiasis.  OPERATION PERFORMED:  Laparoscopic cholecystectomy.  SURGEON:  Dr. Claud Kelp.  ASSISTANT:  Dr. Francina Ames.  INDICATIONS:  This is a 77 year old white male with a history of intermittent epigastric pain for about two months.  He has known coronary artery disease, but has had an extensive coronary workup recently with cardiac catheterization, and the pain is not felt to be due to ischemic coronary disease.  Ultrasound showed a contracted gallbladder with a somewhat thickened wall.  Common bile duct looked normal.  We were asked to see him, and although he has become asymptomatic, and has normal white blood cell count and normal liver function tests, he wanted to go ahead and have his gallbladder remove at this hospitalization.  He is brought to the operating room for a cholecystectomy.  OPERATIVE FINDINGS:  The gallbladder was chronically inflamed and also subacutely inflamed, it had some acute edema in the wall.  The anatomy of the gallbladder and the cystic duct appeared quite conventional.  The liver appeared slightly bumpy and ______  a little bit more than usual.  The stomach, duodenum, small intestine, large intestine, and peritoneal surface looked normal.  DESCRIPTION OF PROCEDURE:  Following the induction of general endotracheal anesthesia, the patients abdomen was prepped and draped in a sterile fashion. A vertical incision was made at the lower rim of the umbilicus.  The fascia was incised in  the midline, and the abdominal cavity entered under direct vision.  A 10 mm Hasson trocar was inserted and secured with a pursestring suture of 0 Vicryl.  Pneumoperitoneum was created.  Video cannula was inserted with visualization and findings as described above.  A 10 mm trocar was placed in the subxiphoid region, and two 5 mm trocars placed in the right upper quadrant.  The gallbladder was elevated by grasping its fundus.  We incised the peritoneum overlying the infundibulum of the gallbladder, and very carefully mobilized the infundibulum of the gallbladder away from the liver. We mobilized the cystic duct and cystic artery.  We isolated the cystic artery as it went on to the gallbladder, secured it with metal clips, and divided it. We then further dissected the cystic duct until we had clear visualization behind the cystic duct and clear visualization of the junction of the cystic duct with the gallbladder, and we felt that we had identified the junction of the cystic duct with the common bile duct.  We then secured the cystic duct with metal clips and divided it.  The cystic duct had a thick wall, but a very narrow lumen.  We then dissected the gallbladder out of its bed.  It was quite friable and tore in a couple of places.  The gallbladder was then removed.  We had some bleeding from the bed  of the gallbladder which took about 2 minutes to control with electrocautery and Surgicel.  We found one bleeder about midway up the wall of the gallbladder which was secured with metal clips.  We have a tiny amount of bile leaking from a duct of Luschka on the right mid gallbladder wall about halfway up the gallbladder bed.  This was minimal, and seemed to have stopped at the completion of the case.  After spending some time to control the bleeding, we applied Surgicel, hemostasis appeared good.  We placed a 85 [place] Blake drain in the subhepatic space, and brought this out through a lateral  port site, and sutured it to the skin with Nylon suture.  The trocars were removed under direct vision.  There was no bleeding from the trocar sites.  The pneumoperitoneum was released.  The fascia at the umbilicus was closed with 0 Vicryl suture.  Skin was irrigated with saline, and the skin closed with subcuticular sutures of 4-0 Vicryl and Steri-Strips.  Clean bandages were placed, and the patient was taken to the recovery room in stable condition.  ESTIMATED BLOOD LOSS:  About 50 cc.  COMPLICATIONS:  None.  Sponge, needle, and instrument counts were correct.DD:  02/15/00 TD:  02/16/00 Job: [id number]

## 2010-12-09 NOTE — Discharge Summary (Signed)
[place]. [facility]  Patient:    Curtis Brennan, Curtis Brennan                       MRN: [id number] Adm. Date:  [id number] Disc. Date: [id number] Attending:  Berry, Jonathan [place] Dictator:   Marya Fossa, P.A. CC:         Runell Gess, M.D.             Dr. Andrey Campanile                           Discharge Summary  DATE OF BIRTH:  10-12-1931.  ADMISSION DIAGNOSES: 1. Unstable angina, rule out myocardial infarction. 2. Known coronary artery disease. 3. Hyperlipidemia. 4. Hypertension. 5. History of prostate carcinoma.  DISCHARGE DIAGNOSES: 1. Chest pain, resolved, myocardial infarction ruled out with negative    enzymes, status post cardiac catheterization revealing noncritical coronary    artery disease. 2. Known coronary artery disease. 3. Hyperlipidemia. 4. Hypertension. 5. History of prostate carcinoma.  HISTORY OF PRESENT ILLNESS:  Curtis Brennan is a 77 year old married white male, patient of Dr. Kenn File, with known coronary artery disease, status post LAD intervention in 1995.  He had a positive Cardiolite, Dec 07, 1999, with anterolateral ischemia.  He had a cardiac catheterization, Dec 08, 1999, which showed 50-60% mid-LAD at a prior [facility] site and a subtotal diagonal #1 which was PTCAed.  He had a normal Cardiolite which was repeated.  The patient has been pain free until 2:30 the morning of admission, when he awoke from sleep with substernal chest pain.  He went to the [facility] Emergency Room, after taking three nitroglycerin without relief.  He was treated with IV nitroglycerin and IV heparin and his pain resolved.  His other problems include hypertension, hyperlipidemia and prostate cancer.  The patient will be admitted for unstable angina, rule out MI.  There is some potential for diagonal restenosis, two months status post PTCA.  We will give the patient Plavix and continue heparin and nitroglycerin.  We will plan cardiac  catheterization.  EKG shows normal sinus rhythm without ST or T wave changes.  PROCEDURE:  Cardiac catheterization, February 07, 2000, by Dr. Nanetta Batty.  COMPLICATIONS:  None.  CONSULTANTS:  None.  COURSE IN THE HOSPITAL:  Patient was admitted and placed on a telemetry bed in stable condition on IV heparin and IV nitroglycerin.  Aspirin and Plavix were started.  Laboratory studies showed a hemoglobin of 15.0, INR 0.9, potassium 4.5, BUN 9, creatinine 0.8.  Cardiac enzymes were negative x 3.  On February 07, 2000, the patient was taken to the cardiac catheterization lab by Dr. Nanetta Batty.  This revealed a normal left main, 50% proximal LAD, 50% mid LAD at a prior [facility] site and a 50% diagonal #1 proximal lesion.  Circumflex was widely patent and the right coronary artery had a 50% proximal lesion. Dr. Allyson Sabal felt there was no critical coronary disease and only evidence of mild restenosis.  I doubt this would be causing his symptomatology.  LV function was normal.  He recommends continued medical therapy and prophylactic proton pump inhibitor therapy.  On February 08, 2000, the patient was deemed stable for discharge to home by Dr. Nanetta Batty.  ACTIVITIES:  He is asked to do no strenuous activity, lifting no more than 5 pounds or driving for two days.  He may shower but should not soak in  a tub or swim for a week.  Perclose was used.  DIET:  He should follow a low-fat, low-cholesterol, low-salt diet.  SPECIAL INSTRUCTIONS:  The patient is to call the office for any problems or questions.  FOLLOWUP:  Followup appointment will be scheduled with Dr. Allyson Sabal in two weeks.  DISCHARGE MEDICATIONS: 1. Enteric-coated aspirin 325 mg a day. 2. Norvasc 10 mg a day. 3. Lopressor 25 mg b.i.d. 4. Prevacid 30 mg a day. 5. Baycol 0.4 mg a day. DD:  02/08/00 TD:  02/09/00 Job: [id number]

## 2010-12-09 NOTE — Discharge Summary (Signed)
NAMEJIHAN, RUDY              ACCOUNT NO.:  [id number]   MEDICAL RECORD NO.:  [id number]          PATIENT TYPE:  INP   LOCATION:  6529                         FACILITY:  [facility]   PHYSICIAN:  Nanetta Batty, M.D.   DATE OF BIRTH:  1932-07-16   DATE OF ADMISSION:  01/11/2005  DATE OF DISCHARGE:  01/13/2005                                 DISCHARGE SUMMARY   DISCHARGE DIAGNOSES:  1.  Urinary tract infection.  2.  Chest pain consistent with unstable angina with negative troponins this      admission.  3.  History of coronary disease with a remote percutaneous coronary      intervention in 1994, diagonal intervention in May 2001 with followup      catheterization July 2001 showing no significant restenosis and moderate      disease.  4.  History of prostate cancer treated with prostatectomy followed by Dr.      Patsi Sears.  5.  Hyperlipidemia.   HOSPITAL COURSE:  The patient is a 77 year old male followed by Dr. Clarene Duke  in [place] and Dr. Allyson Sabal with history of coronary disease as noted above.  He  had an LAD atherectomy in 1994.  In May 2001 he had a diagonal angioplasty.  He was cathed in July 2001 and had 50% LAD and normal LV function.  He  presented to the emergency room January 11, 2005 with chest pain and chills.  EKG showed nonspecific changes.  His initial enzymes were negative.  He was  admitted to telemetry and started on heparin, nitrates, and beta blocker.  Cultures were obtained.  Urine culture came back greater than 100,000 gram-  negative rods.  He was started on Cipro.  His troponins continued to be  negative.  His D-dimer was 0.52.  He improved symptomatically with  antibiotics.  We feel he can be discharged January 13, 2005.  He has an  appointment to see Dr. Allyson Sabal as an outpatient on July 6 and will get an  exercise Cardiolite study prior to that.  He has been encouraged to contact  Dr. Eston Esters office to let him know that he was recently admitted with a  urine  infection.   DISCHARGE MEDICATIONS:  1.  Cipro 500 mg twice daily for eight days.  2.  Norvasc 10 mg daily.  3.  Crestor 5 mg daily.  4.  Coated aspirin daily.  5.  Nitroglycerin sublingual p.r.n.  6.  Toprol XL 50 mg daily, when febrile he did have a five beat run of SVT.   LABORATORY DATA:  Sodium 135, potassium 3.7, BUN 13, creatinine 1.2, white  count 11.3, hemoglobin 14.9, hematocrit 43.1, platelets 181.  Differential  shows 77 neutrophils.  INR 1.1, magnesium 2.1, D-dimer 0.52, TSH 0.78.  Blood cultures are pending.  Urinalysis showed nitrates and leukocyte  esterase.  Chest x-ray:  No acute abnormality with chronic lung disease.  EKG revealed sinus rhythm without acute changes.   DISPOSITION:  The patient is discharged in stable condition, and will follow  up with Dr. Allyson Sabal.  He has appointment scheduled for January 26, 2005  and will  get a Cardiolite before that.  He has been instructed to contact Dr.  Imelda Pillow office and Dr. Fayrene Fearing Little's office.       LKK/MEDQ  D:  01/13/2005  T:  01/13/2005  Job:  [id number]   cc:   Aida Puffer  [address]  [place]  [place] [address]  Fax: [phone]   Sigmund I. Patsi Sears, M.D.  [address]  [place]  [place] [address]  Fax: [phone]

## 2010-12-09 NOTE — Cardiovascular Report (Signed)
[place]. [facility]  Patient:    ITZAEL, LIPTAK                       MRN: [id number] Proc. Date: 02/07/00 Adm. Date:  [id number] Attending:  Berry, Jonathan [place] CC:         [facility] Cardiac Catheterization Lab             [employer] C. Andrey Campanile, M.D.             [facility], [address]                        Cardiac Catheterization  PROCEDURE:  Diagnostic coronary arteriography  CARDIOLOGIST:  Runell Gess, M.D.  INDICATIONS:  Mr. Carmickle is a 77 year old married white male with history of CAD status post LAD directional coronary atherectomy in 1994.  He had a ______  on Dec 07, 1999, for unilateral ischemia.  On catheterization, he was found to have a subtotally occluded first diagonal branch with a patent PCA site.  He underwent PTCA successfully and had a normal Cardiolite after this. He was awakened at 2:30 a.m. with chest pain and came to [facility] where he was placed on IV nitroglycerin and heparin.  His first set of enzymes were negative.  He presents now for diagnostic coronary arteriography.  DESCRIPTION OF PROCEDURE:  The patient was brought to [facility] Cardiac Catheterization lab in the postabsorptive state.  He was premedicated with p.o. Valium.  His right groin was prepped and shaved in the usual sterile fashion.  Xylocaine 1% was used for local anesthesia.  A 6-French sheath was inserted into the right femoral artery using standard Seldinger technique.  A 6-French right and left Judkins silastic catheters along with a 6-French pigtail catheter were sued for selective coronary angiography, left ventriculography, respectively.  Isovue dye was used for the entirety of the case.  Retrograde aorta, left ventricular, and pullback pressures were recorded.  HEMODYNAMICS: 1. Aortic systolic pressure 115, diastolic pressure 64. 2. Left ventricular systolic pressure 160, diastolic pressure  13.  SELECTIVE CORONARY ANGIOGRAPHY: 1. Left main normal. 2. LAD: The LAD had a proximal 50% stenosis.  The first diagonal branch, which    was the area of intervention two months ago, was widely patent with    approximately 50% segmental stenosis.  The LAD just beyond the diagonal    takeoff, which represented the prior PCA site, was widely patent with a 50%    segmental restenosis, unchanged from prior catheterization. 3. Circumflex:  This was a dominant vessel and was free of significant    disease. 4. Right coronary artery:  This was a nondominant vessel and had a 50%    proximal stenosis after the ______ branch.  LEFT VENTRICULOGRAPHY:  RAO left ventriculogram was performed using 25 cc of Omnipaque dye at 12 cc/second.  The overall LV EF was estimated at greater than 60% without focal wall motion abnormalities.  IMPRESSION:  Mr. Ciotti prior percutaneous transluminal coronary angioplasty site is widely patent.  It is somewhat hypodense, but I do not think that this represents a critical stenosis nor does it appear to be flow limiting.  The hand shot was performed through the side arm sheath of the right common femoral artery revealing the sheath to be way above the bifurcation.  Perclose was performed.  The patient tolerated the procedure well.  He  left the lab in stable condition.  PLAN:  Continued medical management and outpatient cardiac stress testing. DD:  02/07/00 TD:  02/08/00 Job: [id number]

## 2010-12-09 NOTE — Cardiovascular Report (Signed)
. [facility]  Patient:    Curtis Brennan, Curtis Brennan                       MRN: [id number] Proc. Date: 12/08/99 Adm. Date:  [id number] Disc. Date: [id number] Attending:  Berry, Jonathan [place] CC:         [facility], [address]             Cardiac Catheterization Laboratory             [place] C. Andrey Campanile, M.D.                        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  I performed proximal LAD directional atherectomy on him in 1995.  His other problems include hypertension.  Dr. Armanda Magic saw him in my absence on May 4 and because of response to exertional chest pain over a Cardiolite stress test, which is read as positive for ischemia in the LAD distribution.  The patient presents now for diagnostic coronary arteriography and potential intervention.  DESCRIPTION OF PROCEDURE:  The patient was brought to the second floor [place] Cardiac Catheterization Lab in the postabsorptive state.  He was premedicated with p.o. Valium.  His right groin was prepped and shaved in the usual sterile fashion.  Xylocaine 1% was used for local anesthesia.  A 6 French sheath was inserted into the right femoral artery using standard Seldinger technique.  A 6 French right and left Judkins diagnostic catheters along with a 6 French pigtail catheter were used for selective coronary angiography, left ventriculography, subselective left internal mammary artery angiography, and distal abdominal aortography.  Omnipaque dye was used for the entirety of the diagnostic case.  Retrograde, aortic, left ventricular and pullback pressures were recorded.  HEMODYNAMICS: 1. Aortic systolic pressure 124, diastolic pressure 69. 2. Left ventricular systolic pressure 122, end-diastolic pressure 12.  SELECTIVE CORONARY ANGIOGRAPHY: 1. Left main:  Normal. 2. Left anterior  descending:  The LAD had a 50% proximal stenosis followed    by 50% segmental proximal stenosis after the first diagonal branch at the    site of prior [facility].  The diagonal branch was subtotally occluded with    approximately 99% fairly focal proximal stenosis. 3. Left circumflex:  This was dominant vessel with a 50% proximal stenosis    with a takeoff of the first large marginal branch. 4. Right coronary artery:  This is a nondominant vessel and was free of    significant disease.  LEFT VENTRICULOGRAPHY:  The RAO left ventriculogram was performed using 25 cc of Omnipaque dye at 12 cc/sec.  The overall LVEF was estimated at greater than 60% without focal wall motion abnormalities.  Left internal mammary artery:  This vessel was subselectively visualized and was widely patent.  It was suitable for use during coronary bypass grafting.  DISTAL ABDOMINAL AORTOGRAPHY:  Distal abdominal aortogram was performed using 30 cc of Omnipaque dye at 20 cc/sec.  The renal arteries were widely patent. The infrarenal abdominal aorta and iliac bifurcation appeared free of significant atherosclerotic changes.  IMPRESSION:  Curtis Brennan has a patent atherectomy site with high-grade subtotal moderate sized first diagonal branch, most likely representing the culprit lesion and corresponding to the area of ischemia on Cardiolite stress testing. We will proceed with PCI.  The existing 6 French sheath in the  right femoral artery was exchanged over a wire for a 7 [place] sheath.  A 6 French sheath was then inserted into the right femoral vein.  The patient received 5000 units of heparin intravenously with an ACT of approximately 260.  He had been on aspirin and Plavix.  He received Integrilin double bolus infusion.  Using a 7 [place] guide catheter along with an OM-4 195 cm length ACS Sport wire, and a 2.0 x 15 mm long Doctor, general practice balloon, PTCA is performed.  Intracoronary nitroglycerin of 200 mg  were given twice during the case.  Because of a suboptimal angiographic result, the balloon was upgraded to a 2.5 x 15 mm long Doctor, general practice balloon and prolonged inflation for two minutes was performed at 6 atmospheres resulting in reduction of a 99% stenosis to approximately 10% and 20% residual without dissection.  There was TIMI-3 flow. I do not think the vessel was large enough to accommodate a 2.5 mm stent.  The patient was stable during the case and had an excellent angiographic result from plain old balloon angioplasty (POBA).  OVERALL IMPRESSION:  Successful percutaneous transluminal coronary angioplasty of a subtotally occluded moderate sized first diagonal branch.  The patients sheaths were sewn securely in place.  The heparin was discontinued.  The sheaths will be removed once the ACT falls below 150. Integrilin will continue for 18 hours.  The patient will be discharged home in the morning if he remains clinically stable overnight and will be seen back in the office in approximately two weeks.  He will obtain a followup Cardiolite stress to document the resolution of his previously demonstrated ischemic abnormality.  He left the lab in stable condition.  Dr. Karma Ganja was notified of these results. DD:  12/08/99 TD:  12/13/99 Job: [id number]

## 2010-12-09 NOTE — Discharge Summary (Signed)
[place]. [facility]  Patient:    Curtis Brennan, Curtis Brennan                       MRN: [id number] Adm. Date:  [id number] Disc. Date: [id number] Attending:  Berry, Jonathan [place] Dictator:   Mancel Bale, P.A. CC:         Runell Gess, M.D.             [place] C. Andrey Campanile, M.D.                           Discharge Summary  ADMISSION DIAGNOSES:  1. Unstable angina.  2. History of known coronary artery disease, status post left anterior     descending atherectomy in 1995.  3. History of prostate cancer, status post external beam radiation and     suprapubic radical prostatectomy.  4. Hypertension.  5. Status post Cardiolite stress test which revealed positive     electrocardiogram changes with exercise and anterior ischemia on imaging.  DISCHARGE DIAGNOSES:  1. Unstable angina.  2. History of known coronary artery disease, status post left anterior     descending atherectomy in 1995.  3. History of prostate cancer, status post external beam radiation and     suprapubic radical prostatectomy.  4. Hypertension.  5. Status post Cardiolite stress test which revealed positive     electrocardiogram changes with exercise and anterior ischemia on imaging.  6. Status post cardiac catheterization on Dec 08, 1999 by Dr. Nanetta Batty     with percutaneous transluminal coronary angioplasty.  HISTORY OF PRESENT ILLNESS: Curtis Brennan is a 77 year old married white male, who is a retired Sales executive.  Dr. Allyson Sabal performed left anterior descending artery atherectomy on him in 1995.  He subsequently had prostate cancer and has undergone external beam radiation and suprapubic radical prostatectomy by Dr. Lynelle Smoke I. Tannenbaum.  His other problems include hypertension.  He saw Dr. Armanda Magic in the office on Nov 25, 1999 because of recent onset of exertional chest pain.  He then had a Cardiolite stress test performed in our office on Dec 07, 1999 which revealed positive  electrocardiographic changes with exercise and anterior ischemia on imaging.  He now presents for diagnostic coronary arteriography and potential intervention.  He was started on Plavix 75 mg q.d. at that time and was planned for admission to [facility] the following day for diagnostic angiography.  The patient was agreeable with this approach.  HOSPITAL COURSE: On Dec 08, 1999 Curtis Brennan underwent cardiac catheterization and intervention by Dr. Nanetta Batty.  Please see his dictated cardiac catheterization report for further details.  He was found to have normal left ventricular function and the LIMA is okay.  His abdominal aorta and renals are normal.  He was found to have moderate restenosis of the prior intervention site but it was not high-grade.  Dr. Allyson Sabal suspected that the culprit was the subtotal moderate sized diagonal vessel.  He then planned to proceed with PCI using integrelin.  The diagonal vessel went from a 99% to a less than 10% stenosis.  He then planned for heparin off integrelin x 18 hours.  Home in the morning with aspirin, Plavix, and follow-up outpatient Cardiolite in four to six weeks.  The morning of Dec 09, 1999 Curtis Brennan had no chest pain.  He has now finished completing the integrelin drip.  His blood pressure is 120-125 systolic.  He  is in normal sinus rhythm.  His lungs are clear.  He has no murmurs on examination.  His right groin has some ecchymosis but no hematoma.  He is felt to be stable for discharge home at this time.  HOSPITAL CONSULTATIONS: None.  HOSPITAL OPERATION/PROCEDURE: Cardiac catheterization with percutaneous transluminal coronary angioplasty to the diagonal vessel on Dec 08, 1999 by Dr. Nanetta Batty.  HOSPITAL LABORATORY DATA: CBC was stable with a WBC of 4.3, hemoglobin 17.3, hematocrit 49.0, platelet count 194,000.  Metabolic profile was normal with a sodium of 139, potassium 4.3, glucose 116, BUN 13, creatinine 1.1.   Cardiac enzymes are negative with CK of 67, MB 0.7, relative index 1.0.  DISCHARGE MEDICATIONS:  1. Enteric-coated aspirin 325 mg q.d.  2. Plavix 75 mg q.d.  3. Norvasc 10 mg q.d.  4. Metoprolol 25 mg b.i.d. (1/2 of a 50 mg tablet b.i.d.)  DISCHARGE ACTIVITY: No strenuous activity, lifting greater than five pounds, driving, or sexual activity for three days.  DISCHARGE DIET: Low-fat/low-salt diet.  WOUND CARE: Gently wash the groin with warm water and soap.  DISCHARGE INSTRUCTIONS: Call the office at [phone] for any bleeding or increased size or pain in the groin.  FOLLOW-UP: Follow up with Dr. Allyson Sabal on Dec 21, 1999 at 4:10 p.m.  He is to be scheduled for an exercise Cardiolite in the next four to six weeks.  I have called the office and they do not have the June schedule at this time.  I have given them the patients phone number and name and they are to call him to schedule an exercise Cardiolite. DD:  12/09/99 TD:  12/13/99 Job: [id number]

## 2010-12-09 NOTE — Discharge Summary (Signed)
Curtis Brennan. [facility]  Patient:    Curtis Brennan, Curtis Brennan                       MRN: [id number] Adm. Date:  [id number] Disc. Date: [id number] Attending:  Berry, Jonathan [place] Dictator:   Marya Fossa, P.A. CC:         Nanetta Batty, M.D., Cardiology             Dr. Derrell Lolling, Surgery             Sharrell Ku, M.D., Gastroenterology             Margrett Rud, M.D., Family Practice                           Discharge Summary  ADMITTING PHYSICIAN:  Nanetta Batty, M.D.  DISCHARGE PHYSICIAN:  Nanetta Batty, M.D.  ADMISSION DIAGNOSES: 1. Chest pain, rule out myocardial infarction. 2. Cholelithiasis. 3. Known coronary artery disease with history of PCIs.  There is no    hypertension or hyperlipidemia.  DISCHARGE DIAGNOSES: 1. Chest pain of GI etiology, myocardial infarction ruled out. 2. Cholelithiasis- status post positive gallbladder ultrasound and    cholecystectomy.  The remainder of the discharge diagnoses is the same and stable.  HISTORY OF PRESENT ILLNESS: [name] is a 77 year old white male, a patient of Dr. Nanetta Batty, who was admitted with chest pain on 02/13/00.  He has known coronary disease, which has been extensively evaluated and treated over the last six years.  He was recently found to have anterolateral ischemia on a Cardiolite in May.  Cardiac catheterization on 12/08/99 revealed 56% mid-LAD at a prior [facility] site and subtotal diagonal 1, which was PTCA.  He had a negative Cardiolite after this.  He was readmitted 02/07/00 with recurrent chest pain. He underwent catheterization on 02/07/00, which revealed no critical disease and mild restenosis of the diagonal; however, medical therapy was recommended.  Last evening, around 15 minutes after eating dinner, he developed chest pain and presented to the emergency room; however, the chest pain subsided and he went back home.  He came back to the emergency room at 4 oclock in the morning with  the same symptoms.  EKG without changes, enzymes negative. Positive right upper quadrant tenderness.  He has a history of nephrolithiasis.  The patient was to be admitted for chest pain, cardiac versus GI.  We will rule out myocardial infarction and obtain gallbladder ultrasound.  PROCEDURES:  Laparoscopic cholecystectomy.  CONSULTATIONS: 1. GI. 2. General surgery.  COMPLICATIONS:  None.  COURSE IN HOSPITAL:  Mr. Macphail was admitted to the hospital and placed on IV heparin.  Cardiac enzymes were negative x 3.  WBC 4.0, hemoglobin 15.5, platelet count 150,000.  BMP within normal limits.  Amylase and lipase were within normal limits.  LFTs within normal limits on 02/14/00.  Gallbladder ultrasound performed 02/13/00 revealed gallstones in a contracted gallbladder.  Gallbladder wall was prominent.  Acute cholecystitis is a strong consideration.  Dr. Kinnie Scales was asked to see the patient in consultation for suspected cholecystitis.  He recommended surgical consultation.  The patient was then seen by Dr. Derrell Lolling on 02/14/00 and felt that laparoscopic cholecystectomy was the best option.  The procedure was performed on 02/15/00 and revealed subacute cholecystitis and cholelithiasis.  The procedure went very well and there were no complications.  On 02/16/00, the patient was stable from a GI/surgical standpoint, as well as cardiac  standpoint with positive bowel sounds and wounds stable.  Drain is intact and will be kept in until Monday.  Followup liver function studies revealed SGOT elevated at 62 and SGPT elevated at 57.  The surgeon felt that this was consistent with surgery.  The patients blood pressure hung around a systolic in the 90s and a pulse rate in the 50s on Lopressor 75 mg a day and he will be discharged on 50 mg a day.  Patient will be discharged home today in stable condition per Dr. Nanetta Batty.  DISCHARGE MEDICATIONS: 1. Prevacid 30 mg. 2. Lopressor 25 mg  b.i.d. 3. Norvasc 10 mg. 4. Enteric coated aspirin 325 mg. 5. Baycol 0.4 mg a day. 6. Sublingual nitroglycerin as needed for chest pain. 7. Vicodin 1-2 every 4-6 hours as needed for abdominal pain.  ACTIVITY:  As tolerated.  DIET: Low fat, low cholesterol, low salt diet.  INSTRUCTIONS:  Patient is to follow home care laparoscopic sheet for wound care.  He is to call the office with any problems or questions.  FOLLOWUP:  He is an appointment scheduled at the end of August with Dr. Allyson Sabal that he should keep and he is to call Dr. Aura Camps office today for a followup appointment on Monday. DD:  02/16/00 TD:  02/19/00 Job: [id number]

## 2011-12-11 ENCOUNTER — Other Ambulatory Visit: Payer: Self-pay | Admitting: Cardiovascular Disease

## 2011-12-11 ENCOUNTER — Ambulatory Visit
Admission: RE | Admit: 2011-12-11 | Discharge: 2011-12-11 | Disposition: A | Discharge status: Home / Self Care | Payer: Medicare Other | Source: Ambulatory Visit | Attending: Cardiovascular Disease | Admitting: Cardiovascular Disease

## 2011-12-11 DIAGNOSIS — R0602 Shortness of breath: Secondary | ICD-10-CM

## 2011-12-12 ENCOUNTER — Encounter (HOSPITAL_COMMUNITY): Payer: Self-pay | Admitting: Respiratory Therapy

## 2011-12-12 ENCOUNTER — Other Ambulatory Visit: Payer: Self-pay | Admitting: Cardiovascular Disease

## 2011-12-14 ENCOUNTER — Inpatient Hospital Stay (HOSPITAL_COMMUNITY)
Admission: RE | Admit: 2011-12-14 | Discharge: 2011-12-27 | DRG: 217 | Disposition: A | Discharge status: Home / Self Care | Payer: Medicare Other | Source: Ambulatory Visit | Attending: Cardiothoracic Surgery | Admitting: Cardiothoracic Surgery

## 2011-12-14 ENCOUNTER — Encounter (HOSPITAL_COMMUNITY)
Admission: RE | Disposition: A | Discharge status: Home / Self Care | Payer: Self-pay | Source: Ambulatory Visit | Attending: Cardiothoracic Surgery

## 2011-12-14 ENCOUNTER — Encounter (HOSPITAL_COMMUNITY): Payer: Self-pay | Admitting: General Practice

## 2011-12-14 DIAGNOSIS — I480 Paroxysmal atrial fibrillation: Secondary | ICD-10-CM | POA: Diagnosis not present

## 2011-12-14 DIAGNOSIS — E785 Hyperlipidemia, unspecified: Secondary | ICD-10-CM | POA: Diagnosis present

## 2011-12-14 DIAGNOSIS — Z952 Presence of prosthetic heart valve: Secondary | ICD-10-CM

## 2011-12-14 DIAGNOSIS — E119 Type 2 diabetes mellitus without complications: Secondary | ICD-10-CM | POA: Diagnosis present

## 2011-12-14 DIAGNOSIS — Z7901 Long term (current) use of anticoagulants: Secondary | ICD-10-CM

## 2011-12-14 DIAGNOSIS — I200 Unstable angina: Secondary | ICD-10-CM | POA: Diagnosis present

## 2011-12-14 DIAGNOSIS — Z951 Presence of aortocoronary bypass graft: Secondary | ICD-10-CM | POA: Diagnosis present

## 2011-12-14 DIAGNOSIS — J449 Chronic obstructive pulmonary disease, unspecified: Secondary | ICD-10-CM | POA: Diagnosis present

## 2011-12-14 DIAGNOSIS — I6529 Occlusion and stenosis of unspecified carotid artery: Secondary | ICD-10-CM | POA: Diagnosis present

## 2011-12-14 DIAGNOSIS — I519 Heart disease, unspecified: Secondary | ICD-10-CM | POA: Diagnosis not present

## 2011-12-14 DIAGNOSIS — I428 Other cardiomyopathies: Secondary | ICD-10-CM | POA: Diagnosis present

## 2011-12-14 DIAGNOSIS — I350 Nonrheumatic aortic (valve) stenosis: Secondary | ICD-10-CM | POA: Diagnosis present

## 2011-12-14 DIAGNOSIS — I10 Essential (primary) hypertension: Secondary | ICD-10-CM | POA: Diagnosis present

## 2011-12-14 DIAGNOSIS — Y9210 Unspecified residential institution as the place of occurrence of the external cause: Secondary | ICD-10-CM | POA: Diagnosis present

## 2011-12-14 DIAGNOSIS — I2510 Atherosclerotic heart disease of native coronary artery without angina pectoris: Secondary | ICD-10-CM

## 2011-12-14 DIAGNOSIS — E8779 Other fluid overload: Secondary | ICD-10-CM | POA: Diagnosis not present

## 2011-12-14 DIAGNOSIS — I779 Disorder of arteries and arterioles, unspecified: Secondary | ICD-10-CM | POA: Diagnosis present

## 2011-12-14 DIAGNOSIS — Z7982 Long term (current) use of aspirin: Secondary | ICD-10-CM

## 2011-12-14 DIAGNOSIS — J4489 Other specified chronic obstructive pulmonary disease: Secondary | ICD-10-CM | POA: Diagnosis present

## 2011-12-14 DIAGNOSIS — D62 Acute posthemorrhagic anemia: Secondary | ICD-10-CM | POA: Diagnosis not present

## 2011-12-14 DIAGNOSIS — I2699 Other pulmonary embolism without acute cor pulmonale: Secondary | ICD-10-CM | POA: Diagnosis present

## 2011-12-14 DIAGNOSIS — Z882 Allergy status to sulfonamides status: Secondary | ICD-10-CM

## 2011-12-14 DIAGNOSIS — I359 Nonrheumatic aortic valve disorder, unspecified: Principal | ICD-10-CM

## 2011-12-14 DIAGNOSIS — Z8546 Personal history of malignant neoplasm of prostate: Secondary | ICD-10-CM

## 2011-12-14 DIAGNOSIS — I4891 Unspecified atrial fibrillation: Secondary | ICD-10-CM | POA: Diagnosis not present

## 2011-12-14 DIAGNOSIS — I2789 Other specified pulmonary heart diseases: Secondary | ICD-10-CM | POA: Diagnosis present

## 2011-12-14 DIAGNOSIS — Z79899 Other long term (current) drug therapy: Secondary | ICD-10-CM

## 2011-12-14 DIAGNOSIS — R443 Hallucinations, unspecified: Secondary | ICD-10-CM | POA: Diagnosis not present

## 2011-12-14 DIAGNOSIS — I658 Occlusion and stenosis of other precerebral arteries: Secondary | ICD-10-CM | POA: Diagnosis present

## 2011-12-14 DIAGNOSIS — Y832 Surgical operation with anastomosis, bypass or graft as the cause of abnormal reaction of the patient, or of later complication, without mention of misadventure at the time of the procedure: Secondary | ICD-10-CM | POA: Diagnosis present

## 2011-12-14 DIAGNOSIS — Z86711 Personal history of pulmonary embolism: Secondary | ICD-10-CM

## 2011-12-14 DIAGNOSIS — N320 Bladder-neck obstruction: Secondary | ICD-10-CM | POA: Diagnosis present

## 2011-12-14 HISTORY — PX: LEFT HEART CATHETERIZATION WITH CORONARY ANGIOGRAM: SHX5451

## 2011-12-14 HISTORY — DX: Long term (current) use of anticoagulants: Z79.01

## 2011-12-14 HISTORY — DX: Dyspnea, unspecified: R06.00

## 2011-12-14 HISTORY — DX: Nausea with vomiting, unspecified: R11.2

## 2011-12-14 HISTORY — DX: Atherosclerotic heart disease of native coronary artery without angina pectoris: I25.10

## 2011-12-14 HISTORY — DX: Peripheral vascular disease, unspecified: I73.9

## 2011-12-14 HISTORY — DX: Angina pectoris, unspecified: I20.9

## 2011-12-14 HISTORY — PX: RIGHT HEART CATHETERIZATION: SHX5447

## 2011-12-14 HISTORY — DX: Adverse effect of unspecified anesthetic, initial encounter: T41.45XA

## 2011-12-14 HISTORY — DX: Pneumonia, unspecified organism: J18.9

## 2011-12-14 HISTORY — DX: Cardiac murmur, unspecified: R01.1

## 2011-12-14 HISTORY — DX: Malignant neoplasm of prostate: C61

## 2011-12-14 HISTORY — DX: Essential (primary) hypertension: I10

## 2011-12-14 HISTORY — DX: Other specified postprocedural states: Z98.890

## 2011-12-14 HISTORY — DX: Legionnaires' disease: A48.1

## 2011-12-14 HISTORY — DX: Unstable angina: I20.0

## 2011-12-14 HISTORY — DX: Nonrheumatic aortic (valve) stenosis: I35.0

## 2011-12-14 HISTORY — PX: CARDIAC CATHETERIZATION: SHX172

## 2011-12-14 HISTORY — DX: Other pulmonary embolism without acute cor pulmonale: I26.99

## 2011-12-14 HISTORY — DX: Calculus of kidney: N20.0

## 2011-12-14 HISTORY — DX: Other forms of dyspnea: R06.09

## 2011-12-14 LAB — POCT I-STAT 3, ART BLOOD GAS (G3+)
Bicarbonate: 25.1 mEq/L — ABNORMAL HIGH (ref 20.0–24.0)
O2 Saturation: 94 %

## 2011-12-14 LAB — POCT I-STAT 3, VENOUS BLOOD GAS (G3P V)
Bicarbonate: 25.3 mEq/L — ABNORMAL HIGH (ref 20.0–24.0)
O2 Saturation: 63 %
TCO2: 27 mmol/L (ref 0–100)

## 2011-12-14 LAB — PROTIME-INR: Prothrombin Time: 14.6 seconds (ref 11.6–15.2)

## 2011-12-14 SURGERY — LEFT HEART CATHETERIZATION WITH CORONARY ANGIOGRAM
Anesthesia: LOCAL

## 2011-12-14 MED ORDER — ASPIRIN 81 MG PO CHEW
81.0000 mg | CHEWABLE_TABLET | Freq: Every day | ORAL | Status: DC
  Administered 2011-12-14 – 2011-12-19 (×6): 81 mg via ORAL
  Filled 2011-12-14 (×6): qty 1

## 2011-12-14 MED ORDER — LISINOPRIL 10 MG PO TABS
10.0000 mg | ORAL_TABLET | Freq: Every day | ORAL | Status: DC
  Administered 2011-12-14 – 2011-12-18 (×5): 10 mg via ORAL
  Filled 2011-12-14 (×6): qty 1

## 2011-12-14 MED ORDER — MORPHINE SULFATE 2 MG/ML IJ SOLN: 1.0000 mg | INTRAMUSCULAR | Status: DC | PRN

## 2011-12-14 MED ORDER — FENTANYL CITRATE 0.05 MG/ML IJ SOLN
INTRAMUSCULAR | Status: AC
  Filled 2011-12-14: qty 2

## 2011-12-14 MED ORDER — SODIUM CHLORIDE 0.9 % IV SOLN
INTRAVENOUS | Status: DC
  Administered 2011-12-14: 15:00:00 via INTRAVENOUS

## 2011-12-14 MED ORDER — AMLODIPINE BESYLATE 5 MG PO TABS
5.0000 mg | ORAL_TABLET | Freq: Two times a day (BID) | ORAL | Status: DC
  Administered 2011-12-14 – 2011-12-19 (×11): 5 mg via ORAL
  Filled 2011-12-14 (×13): qty 1

## 2011-12-14 MED ORDER — ONDANSETRON HCL 4 MG/2ML IJ SOLN: 4.0000 mg | Freq: Four times a day (QID) | INTRAMUSCULAR | Status: DC | PRN

## 2011-12-14 MED ORDER — NITROGLYCERIN 0.2 MG/ML ON CALL CATH LAB
INTRAVENOUS | Status: AC
  Filled 2011-12-14: qty 1

## 2011-12-14 MED ORDER — HEPARIN (PORCINE) IN NACL 2-0.9 UNIT/ML-% IJ SOLN
INTRAMUSCULAR | Status: AC
  Filled 2011-12-14: qty 2000

## 2011-12-14 MED ORDER — SODIUM CHLORIDE 0.9 % IJ SOLN: 3.0000 mL | INTRAMUSCULAR | Status: DC | PRN

## 2011-12-14 MED ORDER — ACETAMINOPHEN 325 MG PO TABS: 650.0000 mg | ORAL_TABLET | ORAL | Status: DC | PRN

## 2011-12-14 MED ORDER — SODIUM CHLORIDE 0.9 % IV SOLN
INTRAVENOUS | Status: AC
  Administered 2011-12-14: 17:00:00 via INTRAVENOUS

## 2011-12-14 MED ORDER — LIDOCAINE HCL (PF) 1 % IJ SOLN
INTRAMUSCULAR | Status: AC
  Filled 2011-12-14: qty 30

## 2011-12-14 MED ORDER — DIAZEPAM 5 MG PO TABS
5.0000 mg | ORAL_TABLET | ORAL | Status: AC
  Administered 2011-12-14: 5 mg via ORAL
  Filled 2011-12-14: qty 1

## 2011-12-14 MED ORDER — MIDAZOLAM HCL 2 MG/2ML IJ SOLN
INTRAMUSCULAR | Status: AC
  Filled 2011-12-14: qty 2

## 2011-12-14 NOTE — H&P (Signed)
  H & P will be scanned in.  Pt was reexamined and existing H & P reviewed. No changes found.  Runell Gess, MD [facility] 12/14/2011 3:20 PM

## 2011-12-14 NOTE — Op Note (Signed)
Curtis Brennan is a 78 y.o. male    [id number] LOCATION:  FACILITY: [facility]  PHYSICIAN: Nanetta Batty, M.D. Nov 16, 1931   DATE OF PROCEDURE:  12/14/2011  DATE OF DISCHARGE:  [facility]  CARDIAC CATHETERIZATION     History obtained from chart review. [name] is a 78 year old married Caucasian male father of 3 children with history of CAD status post LAD and diagonal branch directional atherectomy by myself in 1994. History of multiple pulmonary emboli in the past has been on Coumadin anticoagulation. He also has bilateral carotid disease left greater than right  Followed by duplex ultrasound in the office. He said several weeks of exertional chest pain shortness of breath recently. Echo performed in our office several days ago revealed decrease in his aortic valve area from 1.2 down to 0.8 cm. He presents today for right and left heart cath to define his anatomy and physiology.   PROCEDURE DESCRIPTION  The patient was brought to the second floor  [place] [facility]iac cath lab in the postabsorptive state. He was  premedicated with Valium 5 mg by mouth and IV Versed and fentanyl. His right groin was prepped and shaved in usual sterile fashion. Xylocaine 1% was used  for local anesthesia. A 6 French sheath was inserted into the right common femoral  artery using standard Seldinger technique.a 7 [place] sheath was inserted into the right common femoral vein using standard Seldinger technique. A 7 French Swan-Ganz catheter was then advanced through the right heart chambers obtaining sequential pressures, arterial and venous blood samples for Fick and thermal dilution cardiac outputs. Visipaque dye was used for the entirety of the case. Retroperiaortic, left ventriculogram pullback pressures were recorded.  HEMODYNAMICS:    AO SYSTOLIC/AO DIASTOLIC: 110/66   LV SYSTOLIC/LV DIASTOLIC: 143/18   RA pressure: 14/11  Right ventricular pressure:32/8  Pulmonary artery  pressure:32/13  Pulmonary wedge pressure:15/13  Aortic valve area: 1 cm  Aortic valve gradient: 33 mm peak to peak : ANGIOGRAPHIC RESULTS:   1. Left main; 60 % 2. LAD; 95% proximal 3. Left circumflex; 90% mid.  4. Right coronary artery; nondominant, 60% proximal 5.LIMA was widely patent and suitable for coronary artery bypass grafting    IMPRESSION:[name] has. Left main 3 vessel disease and moderate aortic stenosis. He will need coronary artery bypass grafting and aortic valve replacement. Because He is already on Coumadin anticoagulation for pulmonary embol. he will need a mechanical aortic valve. Because of the critical nature of his anatomy and his unstable symptoms he will need to stay in the hospital for his surgical procedure.  Runell Gess MD, [facility] 12/14/2011 4:07 PM

## 2011-12-15 ENCOUNTER — Encounter (HOSPITAL_COMMUNITY): Payer: Self-pay | Admitting: Cardiology

## 2011-12-15 ENCOUNTER — Other Ambulatory Visit: Payer: Self-pay

## 2011-12-15 ENCOUNTER — Ambulatory Visit (HOSPITAL_COMMUNITY): Payer: Medicare Other

## 2011-12-15 DIAGNOSIS — I200 Unstable angina: Secondary | ICD-10-CM | POA: Diagnosis present

## 2011-12-15 DIAGNOSIS — I2510 Atherosclerotic heart disease of native coronary artery without angina pectoris: Secondary | ICD-10-CM

## 2011-12-15 DIAGNOSIS — Z01810 Encounter for preprocedural cardiovascular examination: Secondary | ICD-10-CM

## 2011-12-15 DIAGNOSIS — I359 Nonrheumatic aortic valve disorder, unspecified: Secondary | ICD-10-CM

## 2011-12-15 DIAGNOSIS — I350 Nonrheumatic aortic (valve) stenosis: Secondary | ICD-10-CM

## 2011-12-15 DIAGNOSIS — I779 Disorder of arteries and arterioles, unspecified: Secondary | ICD-10-CM

## 2011-12-15 DIAGNOSIS — Z951 Presence of aortocoronary bypass graft: Secondary | ICD-10-CM | POA: Diagnosis present

## 2011-12-15 DIAGNOSIS — Z7901 Long term (current) use of anticoagulants: Secondary | ICD-10-CM

## 2011-12-15 HISTORY — DX: Disorder of arteries and arterioles, unspecified: I77.9

## 2011-12-15 HISTORY — DX: Atherosclerotic heart disease of native coronary artery without angina pectoris: I25.10

## 2011-12-15 HISTORY — DX: Long term (current) use of anticoagulants: Z79.01

## 2011-12-15 HISTORY — DX: Nonrheumatic aortic (valve) stenosis: I35.0

## 2011-12-15 HISTORY — DX: Unstable angina: I20.0

## 2011-12-15 LAB — HEPARIN LEVEL (UNFRACTIONATED): Heparin Unfractionated: 0.64 IU/mL (ref 0.30–0.70)

## 2011-12-15 LAB — PULMONARY FUNCTION TEST

## 2011-12-15 MED ORDER — ALBUTEROL SULFATE (5 MG/ML) 0.5% IN NEBU
2.5000 mg | INHALATION_SOLUTION | Freq: Once | RESPIRATORY_TRACT | Status: AC
  Administered 2011-12-15: 2.5 mg via RESPIRATORY_TRACT

## 2011-12-15 MED ORDER — HEPARIN (PORCINE) IN NACL 100-0.45 UNIT/ML-% IJ SOLN
1400.0000 [IU]/h | INTRAMUSCULAR | Status: DC
  Filled 2011-12-15: qty 250

## 2011-12-15 MED ORDER — HEPARIN (PORCINE) IN NACL 100-0.45 UNIT/ML-% IJ SOLN
1050.0000 [IU]/h | INTRAMUSCULAR | Status: DC
  Administered 2011-12-15: 1500 [IU]/h via INTRAVENOUS
  Administered 2011-12-15 – 2011-12-16 (×2): 1400 [IU]/h via INTRAVENOUS
  Administered 2011-12-16: 1200 [IU]/h via INTRAVENOUS
  Administered 2011-12-17: 1100 [IU]/h via INTRAVENOUS
  Administered 2011-12-17: 950 [IU]/h via INTRAVENOUS
  Administered 2011-12-19: 1050 [IU]/h via INTRAVENOUS
  Filled 2011-12-15 (×8): qty 250

## 2011-12-15 NOTE — Consult Note (Signed)
[address]            [place] [address]          [id number]       Susan Arana [facility] Medical Record #[id number] Date of Birth: 20-Feb-1932  Referring: Dr Allyson Sabal Primary Care: Dr Aida Puffer   Chief Complaint:   SOB   History of Present Illness:     Patient is a 78 year old male with known heart murmur and history of coronary occlusive disease has been followed by Dr. Allyson Sabal. Over the past weeks to months he has noted increasing exertional chest discomfort and shortness of breath. The patient notes that this is been going on for several weeks to his family notes that he's had general decline for months and is physical ability. They note that he gets significantly short of breath walking to the mailbox. He's had a known heart murmur and a echocardiogram in 2002 and showed moderate aortic stenosis with a valve area of 1.2 cm. Because of the complaints he return to see Dr. Allyson Sabal echocardiogram was performed patient's Coumadin was held and he was admitted yesterday for cardiac catheterization. Cardiac surgery consultation is been requested for consideration of coronary artery bypass grafting and aortic valve replacement.  Current Activity/ Functional Status: Patient is independent with mobility/ambulation, transfers, ADL's, IADL's.   Past Medical History  Diagnosis Date  . Prostate cancer   . Complication of anesthesia 07/1997    "just wouldn't wake wakeup"  . PONV (postoperative nausea and vomiting)   . Coronary artery disease   . Hypertension   . Heart murmur   . Angina   . Pulmonary embolism ~ 2011    "both lungs"  . Legionnaire's disease 1986  . Pneumonia     "once"  . Exertional dyspnea   . Kidney stones   . Unstable angina 12/15/2011  . CAD (coronary artery disease) with history of LAD & Diag athrectomy in 1994, now with severe 3 vesseal CAD by cath 12/14/11 for CABG 12/15/2011  . AS (aortic stenosis) moderate to severe by cath  12/14/11 for AVR  12/15/2011  . Chronic anticoagulation, on coumadin for pul emboli 12/15/2011  . Carotid artery disease, bilateral 12/15/2011    Past Surgical History  Procedure Date  . Coronary angioplasty 1994    "roto rooter"  . Cardiac catheterization 12/14/11  . Cholecystectomy 2001  . Prostate surgery- prostate cancer treated with resection 07/1997  . Lithotripsy 2006  . Gallstone removed- ERCP ~ 2010    "was lodged below pancreatis"    History  Smoking status  . Never Smoker   Smokeless tobacco  . Never Used    History  Alcohol Use No    History   Social History  . Marital Status: Married    Spouse Name: N/A    Number of Children: N/A  . Years of Education: N/A   Occupational History  . Not on file.   Social History Main Topics  . Smoking status: Never Smoker   . Smokeless tobacco: Never Used  . Alcohol Use: No  . Drug Use: No  . Sexually Active: Yes   Other Topics Concern  . Not on file   Social History Narrative  . No narrative on file    Allergies  Allergen Reactions  . Sulfonamide Derivatives Rash    "I don't even remember having problem; dr says  I did"    Current Facility-Administered Medications  Medication Dose Route Frequency Provider Last Rate Last Dose  . 0.9 %  sodium chloride infusion   Intravenous Continuous Runell Gess, MD 75 mL/hr at 12/14/11 1700    . acetaminophen (TYLENOL) tablet 650 mg  650 mg Oral Q4H PRN Runell Gess, MD      . albuterol (PROVENTIL) (5 MG/ML) 0.5% nebulizer solution 2.5 mg  2.5 mg Nebulization Once [name], MD   2.5 mg at 12/15/11 1033  . amLODipine (NORVASC) tablet 5 mg  5 mg Oral BID Runell Gess, MD   5 mg at 12/15/11 1134  . aspirin chewable tablet 81 mg  81 mg Oral Daily Runell Gess, MD   81 mg at 12/15/11 1134  . heparin ADULT infusion 100 units/mL (25000 units/250 mL)  1,400 Units/hr Intravenous Continuous Hilario Quarry Amend, PHARMD 14 mL/hr at 12/15/11 1229 1,400 Units/hr at  12/15/11 1229  . lisinopril (PRINIVIL,ZESTRIL) tablet 10 mg  10 mg Oral Daily Runell Gess, MD   10 mg at 12/15/11 1135  . morphine 2 MG/ML injection 1 mg  1 mg Intravenous Q1H PRN Runell Gess, MD      . ondansetron [facility]) injection 4 mg  4 mg Intravenous Q6H PRN Runell Gess, MD        Prescriptions prior to admission  Medication Sig Dispense Refill  . amLODipine (NORVASC) 5 MG tablet Take 5 mg by mouth 2 (two) times daily.      [name] aspirin 81 MG tablet Take 81 mg by mouth daily.      [name] lisinopril (PRINIVIL,ZESTRIL) 10 MG tablet Take 10 mg by mouth daily.      . metoprolol tartrate (LOPRESSOR) 25 MG tablet Take 25 mg by mouth 2 (two) times daily.      [name] warfarin (COUMADIN) 10 MG tablet Take 10 mg by mouth daily.        History reviewed. No pertinent family history.   Review of Systems:     Cardiac Review of Systems: Y or N  Chest Pain Cove.Etienne    ]  Resting SOB [  n ] Exertional SOB  Cove.Etienne  ]  Orthopnea [ y ]   Pedal Edema [  y ]    Palpitations [n  ] Syncope  [ n ]   Presyncope [ y  ]  General Review of Systems: [Y] = yes [  ]=no Constitional: recent weight change [  n]; anorexia [  ]; fatigue [  ]; nausea [  ]; night sweats [ n ]; fever [n  ]; or chills [  ];                                                                                                                                          Dental: poor dentition[n ]; Last Dentist visit:  last month  Eye : blurred vision [  ]; diplopia [   ]; vision changes [  ];  Amaurosis fugax[  ]; Resp: cough [  ];  wheezing[ n ];  hemoptysis[ n ]; shortness of breath[ y ]; paroxysmal nocturnal dyspnea[  n]; dyspnea on exertion[y  ]; or orthopnea[ n ];  GI:  gallstones[  ], vomiting[  ];  dysphagia[  ]; melena[  ];  hematochezia [  ]; heartburn[  ];   Hx of  Colonoscopy[  ]; GU: kidney stones [  ]; hematuria[  ];   dysuria [  ];  nocturia[  ];  history of     obstruction [  ];             Skin: rash, swelling[  ];, hair loss[  ];   peripheral edema[  ];  or itching[  ]; Musculosketetal: myalgias[  ];  joint swelling[  ];  joint erythema[  ];  joint pain[  ];  back pain[  ];  Heme/Lymph: bruising[  ];  bleeding[  ];  anemia[  ];  Neuro: TIA[  ];  headaches[  ];  stroke[ n ];  vertigo[y  ];  seizures[  ];   paresthesias[  ];  difficulty walking[ n ];  Psych:depression[n  ]; anxiety[ n ];  Endocrine: diabetes[n  ];  thyroid dysfunction[ n ];  Immunizations: Flu [ n ]; Pneumococcal[n  ];  Other:  Physical Exam: BP 107/60  Pulse 70  Temp(Src) 97.9 F (36.6 C) (Oral)  Resp 17  Ht 5\' 9"  (1.753 m)  Wt 208 lb 5.4 oz (94.5 kg)  BMI 30.77 kg/m2  SpO2 96% Physical Exam  Constitutional: He is oriented to person, place, and time. No distress.  HENT:  Head: Normocephalic.  Eyes: Right eye exhibits no discharge.  Neck: Normal carotid pulses and no JVD present. Carotid bruit is not present. No tracheal deviation present. No mass and no thyromegaly present.  Cardiovascular: Normal rate and regular rhythm.   Murmur heard.  Crescendo systolic murmur is present with a grade of 4/6   No diastolic murmur is present  Pulses:      Carotid pulses are 2+ on the right side, and 2+ on the left side.      Radial pulses are 2+ on the right side, and 2+ on the left side.       Femoral pulses are 2+ on the right side, and 2+ on the left side.      Popliteal pulses are 2+ on the right side, and 2+ on the left side.       Dorsalis pedis pulses are 2+ on the right side, and 2+ on the left side.       Posterior tibial pulses are 2+ on the right side, and 2+ on the left side.  Pulmonary/Chest: Effort normal. No stridor. No respiratory distress. He has no wheezes. He has no rhonchi. He has no rales.  Abdominal: He exhibits no distension and no mass. There is no tenderness. There is no rebound and no guarding.  Musculoskeletal: He exhibits no edema and no tenderness.  Lymphadenopathy:    He has no cervical adenopathy.    He has no axillary  adenopathy.  Neurological: He is alert and oriented to person, place, and time.  Skin: He is not diaphoretic.  Psychiatric: Mood, memory, affect and judgment normal.   right leg venous varicosities Patient has full upper plate , with full upper dental extraction Lower teeth  are intact without any loose or obviously infected teeth  Diagnostic Studies & Laboratory data:     Recent Radiology Findings:  Dg Chest 2 View  12/11/2011  *RADIOLOGY REPORT*  Clinical Data: Chest pain.  Short of breath.  CHEST - 2 VIEW  Comparison: 05/19/2010  Findings: Borderline cardiomegaly.  Nodular interstitial changes throughout both lungs are stable.  Low volumes.  Pleural changes at the right base.  Intact thoracic spine.  No pleural effusion.  IMPRESSION: Cardiomegaly and chronic interstitial changes.  Original Report Authenticated By: Donavan Burnet, M.D.    Recent Lab Findings: Lab Results  Component Value Date   WBC 4.3 09/27/2009   HGB 16.0 09/27/2009   HCT 45.7 09/27/2009   PLT 176 09/27/2009   GLUCOSE 129* 09/27/2009   CHOL  Value: 138        ATP III CLASSIFICATION:  <200     mg/dL   Desirable  213-086  mg/dL   Borderline High  >=578    mg/dL   High        10/28/9627   TRIG 60 08/29/2008   HDL 38* 08/29/2008   LDLCALC  Value: 88        Total Cholesterol/HDL:CHD Risk Coronary Heart Disease Risk Table                     Men   Women  1/2 Average Risk   3.4   3.3  Average Risk       5.0   4.4  2 X Average Risk   9.6   7.1  3 X Average Risk  23.4   11.0        Use the calculated Patient Ratio above and the CHD Risk Table to determine the patient's CHD Risk.        ATP III CLASSIFICATION (LDL):  <100     mg/dL   Optimal  528-413  mg/dL   Near or Above                    Optimal  130-159  mg/dL   Borderline  244-010  mg/dL   High  >272     mg/dL   Very High 11/24/6642   ALT 28 09/27/2009   AST 26 09/27/2009   NA 139 09/27/2009   K 4.0 09/27/2009   CL 106 09/27/2009   CREATININE 1.02 09/27/2009   BUN 13 09/27/2009   CO2 27 09/27/2009    INR 1.12 12/14/2011    Cardiac Cath: [facility]  CARDIAC CATHETERIZATION  History obtained from chart review. Her Ballantine is a 78 year old married Caucasian male father of 3 children with history of CAD status post LAD and diagonal branch directional atherectomy by myself in 1994. History of multiple pulmonary emboli in the past has been on Coumadin anticoagulation. He also has bilateral carotid disease left greater than right Followed by duplex ultrasound in the office. He said several weeks of exertional chest pain shortness of breath recently. Echo performed in our office several days ago revealed decrease in his aortic valve area from 1.2 down to 0.8 cm. He presents today for right and left heart cath to define his anatomy and physiology.  PROCEDURE DESCRIPTION  The patient was brought to the second floor  [place] [facility]iac cath lab in the postabsorptive state. He was  premedicated with Valium 5 mg by mouth and IV Versed and fentanyl. His right groin  was prepped and shaved in  usual sterile fashion. Xylocaine 1% was used  for local anesthesia. A 6 French sheath was inserted into the right common femoral  artery using standard Seldinger technique.a 7 [place] sheath was inserted into the right common femoral vein using standard Seldinger technique. A 7 French Swan-Ganz catheter was then advanced through the right heart chambers obtaining sequential pressures, arterial and venous blood samples for Fick and thermal dilution cardiac outputs. Visipaque dye was used for the entirety of the case. Retroperiaortic, left ventriculogram pullback pressures were recorded.  HEMODYNAMICS:  AO SYSTOLIC/AO DIASTOLIC: 110/66  LV SYSTOLIC/LV DIASTOLIC: 143/18  RA pressure: 14/11  Right ventricular pressure:32/8  Pulmonary artery pressure:32/13  Pulmonary wedge pressure:15/13  Aortic valve area: 1 cm    Aortic valve gradient: 33 mm peak to peak  :  ANGIOGRAPHIC RESULTS:  1. Left  main; 60 %  2. LAD; 95% proximal  3. Left circumflex; 90% mid.  4. Right coronary artery; nondominant, 60% proximal  5.LIMA was widely patent and suitable for coronary artery bypass grafting  IMPRESSION:Mr. Peary has. Left main 3 vessel disease and moderate aortic stenosis. He will need coronary artery bypass grafting and aortic valve replacement. Because He is already on Coumadin anticoagulation for pulmonary embol. he will need a mechanical aortic valve. Because of the critical nature of his anatomy and his unstable symptoms he will need to stay in the hospital for his surgical procedure.  Runell Gess MD, [facility]  12/14/2011  4:07 PM    ECHO: Echocardiogram is dated 12/11/2011 paper report accompanies the patient from [facility] mild mitral regurgitation is noted aortic valve area is gone from 1.2-0.85 cm there is no aortic regurgitation appreciated aortic root size is normal maximum velocity 411 cm/s with estimated peak gradient of 68 mm Hg     Assessment / Plan:     Patient is a 78 year old male who presents with worsening symptoms of coronary occlusive disease and aortic stenosis, he has documented 3 vessel coronary artery disease and worsening aortic stenosis. With this combination of symptoms anatomic change in his aortic valve and three-vessel coronary artery disease I have recommended to them that we proceed with coronary artery bypass grafting and aortic valve replacement with a pericardial tissue valve. Although he will continue on Coumadin because of the history of pulmonary embolus, a tissue valve would be durable enough. The risks and options of surgery are discussed with the patient and his family in detail.   The goals risks and alternatives of the planned surgical procedure AVR, CABG  have been discussed with the patient in detail. The risks of the procedure including death, infection, stroke, myocardial infarction, bleeding, blood transfusion have all  been discussed specifically.  I have quoted [name] a 5 % of perioperative mortality and a complication rate as high as 25%. The patient's questions have been answered.Leobardo Granlund is willing  to proceed with the planned procedure. Plan to proceed Wednesday, May 29.         [name] MD  [facility] [phone] Office [phone] 12/15/2011 4:01 PM

## 2011-12-15 NOTE — Progress Notes (Signed)
VASCULAR LAB PRELIMINARY  PRELIMINARY  PRELIMINARY  PRELIMINARY  Bilateral lower extremity venous duplex  completed.    Preliminary report:  Bilateral:  No evidence of DVT, superficial thrombosis, or Baker's Cyst. Bilateral GSV patent with mild branching in the calf.   Terance Hart, RVT 12/15/2011, 12:03 PM

## 2011-12-15 NOTE — Progress Notes (Signed)
ANTICOAGULATION CONSULT NOTE - Follow Up Consult  Pharmacy Consult for Heparin Indication: pulmonary embolus (history)  Allergies  Allergen Reactions  . Sulfonamide Derivatives Rash    "I don't even remember having problem; dr says I did"    Patient Measurements: Height: 5\' 9"  (175.3 cm) Weight: 208 lb 5.4 oz (94.5 kg) IBW/kg (Calculated) : 70.7  Heparin Dosing Weight: 90 kg  Vital Signs: Temp: 97.9 F (36.6 C) (05/24 0400) Temp src: Oral (05/24 0400) BP: 107/60 mmHg (05/24 0400) Pulse Rate: 70  (05/24 0400)  Labs:  Basename 12/15/11 1438 12/14/11 1426  HGB -- --  HCT -- --  PLT -- --  APTT -- --  LABPROT -- 14.6  INR -- 1.12  HEPARINUNFRC 0.64 --  CREATININE -- --  CKTOTAL -- --  CKMB -- --  TROPONINI -- --    Estimated Creatinine Clearance: 68.8 ml/min (by C-G formula based on Cr of 1.02).   Medications:  Scheduled:    . albuterol  2.5 mg Nebulization Once  . amLODipine  5 mg Oral BID  . aspirin  81 mg Oral Daily  . lisinopril  10 mg Oral Daily   Infusions:    . sodium chloride 75 mL/hr at 12/14/11 1700  . heparin 1,400 Units/hr (12/15/11 1229)  . DISCONTD: sodium chloride 75 mL/hr at 12/14/11 1449  . DISCONTD: heparin      Assessment: 78yo male s/p cath 5/23 found with 3VD and moderate aortic stenosis. Awaiting CABG and mechanical AVR (probably Tuesday 5/28). On Coumadin 10 mg daily PTA. To bridge with heparin for hx PE.  Heparin level therapeutic on 1400 units/hr.  Goal of Therapy:  Heparin level 0.3-0.7 units/ml Monitor platelets by anticoagulation protocol: Yes   Plan:  Continue heparin at 1400 units/hr. Check heparin level at 2200 tonight to confirm. Follow-up with CBC and heparin level daily with AM labs.  [employer], Pharm.D., BCPS Clinical Pharmacist Pager [phone] 12/15/2011 3:33 PM

## 2011-12-15 NOTE — Plan of Care (Signed)
Problem: Phase II Progression Outcomes Goal: Discharge plan in place and appropriate Outcome: Not Met (add Reason) Pt to be scheduled for CVT Surgery

## 2011-12-15 NOTE — Progress Notes (Addendum)
Pre-op Cardiac Surgery  Carotid Findings:  Carotid study recently completed at [facility].  Upper Extremity Right Left  Brachial Pressures 121 121  Radial Waveforms Tri Tri  Ulnar Waveforms Tri Tri  Palmar Arch (Allen's Test) Normal with radial compression, decreases >50% with ulnar compression Normal with radial compression, decreases >50% with ulnar compression     Lower  Extremity Right Left  Dorsalis Pedis Bi, 161 Bi, 148  Anterior Tibial    Posterior Tibial Tri, 161 Tri, 169  Ankle/Brachial Indices 1.33 1.4    Findings:  Normal ABI study  Farrel Demark, RDMS

## 2011-12-15 NOTE — Progress Notes (Signed)
ANTICOAGULATION CONSULT NOTE - Follow Up Consult  Pharmacy Consult for heparin Indication: h/o PE  Labs:  Basename 12/15/11 2156 12/15/11 1438 12/14/11 1426  HGB -- -- --  HCT -- -- --  PLT -- -- --  APTT -- -- --  LABPROT -- -- 14.6  INR -- -- 1.12  HEPARINUNFRC 0.32 0.64 --  CREATININE -- -- --  CKTOTAL -- -- --  CKMB -- -- --  TROPONINI -- -- --    Assessment: 78yo male remains therapeutic on heparin though trending down significantly.  Goal of Therapy:  Heparin level 0.3-0.7 units/ml   Plan:  Will increase heparin gtt by 1 unit/kg/hr to 1500 units/hr to maintain therapeutic level and check level with am labs.  Colleen Can PharmD BCPS 12/15/2011,11:16 PM

## 2011-12-15 NOTE — Progress Notes (Signed)
ANTICOAGULATION CONSULT NOTE - Follow Up Consult  Pharmacy Consult for heparin Indication: pulmonary embolus  Allergies  Allergen Reactions  . Sulfonamide Derivatives Rash    "I don't even remember having problem; dr says I did"    Patient Measurements: Height: 5\' 9"  (175.3 cm) Weight: 208 lb 5.4 oz (94.5 kg) IBW/kg (Calculated) : 70.7   Vital Signs: Temp: 97.9 F (36.6 C) (05/24 0400) Temp src: Oral (05/24 0400) BP: 107/60 mmHg (05/24 0400) Pulse Rate: 70  (05/24 0400)  Labs:  Basename 12/14/11 1426  HGB --  HCT --  PLT --  APTT --  LABPROT 14.6  INR 1.12  HEPARINUNFRC --  CREATININE --  CKTOTAL --  CKMB --  TROPONINI --    Estimated Creatinine Clearance: 68.8 ml/min (by C-G formula based on Cr of 1.02).   Medications:  Prescriptions prior to admission  Medication Sig Dispense Refill  . amLODipine (NORVASC) 5 MG tablet Take 5 mg by mouth 2 (two) times daily.      [name] aspirin 81 MG tablet Take 81 mg by mouth daily.      [name] lisinopril (PRINIVIL,ZESTRIL) 10 MG tablet Take 10 mg by mouth daily.      . metoprolol tartrate (LOPRESSOR) 25 MG tablet Take 25 mg by mouth 2 (two) times daily.      [name] warfarin (COUMADIN) 10 MG tablet Take 10 mg by mouth daily.       Scheduled:    . amLODipine  5 mg Oral BID  . aspirin  81 mg Oral Daily  . diazepam  5 mg Oral On Call  . fentaNYL      . heparin      . lidocaine      . lisinopril  10 mg Oral Daily  . midazolam      . nitroGLYCERIN        Assessment: 78yo male s/p cath found with 3VD now awaiting CABG and AVR, on Coumadin PTA for h/o PE, to begin heparin gtt without bolus.  Goal of Therapy:  Heparin level 0.3-0.7 units/ml Monitor platelets by anticoagulation protocol: Yes   Plan:  Will begin heparin gtt at 1400 units/hr and monitor heparin levels and CBC.  Colleen Can PharmD BCPS 12/15/2011,7:36 AM

## 2011-12-15 NOTE — Progress Notes (Signed)
Subjective:  No CP/SOB  Objective:  Temp:  [97 F (36.1 C)-98 F (36.7 C)] 97.9 F (36.6 C) (05/24 0400) Pulse Rate:  [68-85] 70  (05/24 0400) Resp:  [16-24] 17  (05/24 0400) BP: (107-138)/(49-78) 107/60 mmHg (05/24 0400) SpO2:  [95 %-98 %] 96 % (05/24 0400) Weight:  [94.348 kg (208 lb)-94.5 kg (208 lb 5.4 oz)] 94.5 kg (208 lb 5.4 oz) (05/24 0400) Weight change:   Intake/Output from previous day: 05/23 0701 - 05/24 0700 In: 960 [P.O.:510; I.V.:450] Out: -   Intake/Output from this shift:    Physical Exam: General appearance: alert and cooperative Neck: no adenopathy, no carotid bruit, no JVD, supple, symmetrical, trachea midline and thyroid not enlarged, symmetric, no tenderness/mass/nodules Lungs: clear to auscultation bilaterally Heart: 2/6 SEM at base c/w AS Extremities: extremities normal, atraumatic, no cyanosis or edema and right groin OK  Lab Results: Results for orders placed during the hospital encounter of 12/14/11 (from the past 48 hour(s))  PROTIME-INR     Status: Normal   Collection Time   12/14/11  2:26 PM      Component Value Range Comment   Prothrombin Time 14.6  11.6 - 15.2 (seconds)    INR 1.12  0.00 - 1.49    POCT I-STAT 3, BLOOD GAS (G3P V)     Status: Abnormal   Collection Time   12/14/11  3:46 PM      Component Value Range Comment   pH, Ven 7.328 (*) 7.250 - 7.300     pCO2, Ven 48.2  45.0 - 50.0 (mmHg)    pO2, Ven 35.0  30.0 - 45.0 (mmHg)    Bicarbonate 25.3 (*) 20.0 - 24.0 (mEq/L)    TCO2 27  0 - 100 (mmol/L)    O2 Saturation 63.0      Acid-base deficit 1.0  0.0 - 2.0 (mmol/L)    Sample type VENOUS      Comment NOTIFIED PHYSICIAN     POCT I-STAT 3, BLOOD GAS (G3+)     Status: Abnormal   Collection Time   12/14/11  3:46 PM      Component Value Range Comment   pH, Arterial 7.374  7.350 - 7.450     pCO2 arterial 43.1  35.0 - 45.0 (mmHg)    pO2, Arterial 71.0 (*) 80.0 - 100.0 (mmHg)    Bicarbonate 25.1 (*) 20.0 - 24.0 (mEq/L)    TCO2 26  0 -  100 (mmol/L)    O2 Saturation 94.0      Sample type ARTERIAL       Imaging: Imaging results have been reviewed  Assessment/Plan:   1. Active Problems: 2.  * No active hospital problems. *  3.   Time Spent Directly with Patient:  20 minutes  Length of Stay:  LOS: 1 day   S/P R/L heart cath. LM 3VD with Moderately severe AS and preserved LV fxn. H/O PE on coumadin A/C as an OP. Will  Restart IV hep  And transfer to tele. For CABG/AVR with mechanical valve hopefully Tuesday.  Runell Gess 12/15/2011, 7:21 AM

## 2011-12-16 DIAGNOSIS — Z01810 Encounter for preprocedural cardiovascular examination: Secondary | ICD-10-CM

## 2011-12-16 DIAGNOSIS — J449 Chronic obstructive pulmonary disease, unspecified: Secondary | ICD-10-CM | POA: Diagnosis present

## 2011-12-16 LAB — CBC
HCT: 42.4 % (ref 39.0–52.0)
Hemoglobin: 14.9 g/dL (ref 13.0–17.0)
MCH: 32.5 pg (ref 26.0–34.0)
MCHC: 35.1 g/dL (ref 30.0–36.0)
MCV: 92.4 fL (ref 78.0–100.0)
Platelets: 137 10*3/uL — ABNORMAL LOW (ref 150–400)
RBC: 4.59 MIL/uL (ref 4.22–5.81)
RDW: 12.1 % (ref 11.5–15.5)
WBC: 3.9 10*3/uL — ABNORMAL LOW (ref 4.0–10.5)

## 2011-12-16 LAB — BASIC METABOLIC PANEL
BUN: 14 mg/dL (ref 6–23)
CO2: 22 mEq/L (ref 19–32)
Chloride: 103 mEq/L (ref 96–112)
Creatinine, Ser: 0.85 mg/dL (ref 0.50–1.35)
Glucose, Bld: 124 mg/dL — ABNORMAL HIGH (ref 70–99)

## 2011-12-16 MED ORDER — METOPROLOL TARTRATE 25 MG PO TABS
25.0000 mg | ORAL_TABLET | Freq: Two times a day (BID) | ORAL | Status: DC
  Administered 2011-12-16 – 2011-12-19 (×8): 25 mg via ORAL
  Filled 2011-12-16 (×10): qty 1

## 2011-12-16 NOTE — Progress Notes (Signed)
Called report to [facility], on 2000, patient transferred with all belongings.

## 2011-12-16 NOTE — Progress Notes (Signed)
VASCULAR LAB PRELIMINARY  PRELIMINARY  PRELIMINARY  PRELIMINARY  Right Lower Extremity Vein Map    Right Great Saphenous Vein   Segment Diameter Comment  1. Origin 6.7 mm   2. High Thigh 5.6 mm branch  3. Mid Thigh 4.3 mm   4. Low Thigh 4 mm branch  5. At Knee 3.4 mm branch  6. High Calf 3.6 mm branch  7. Low Calf 2.8 mm branch  8. Ankle 2.1 mm branch   mm    mm    mm    Left Lower Extremity Vein Map    Left Great Saphenous Vein   Segment Diameter Comment  1. Origin 8 mm   2. High Thigh 5 mm branch  3. Mid Thigh 4.3 mm   4. Low Thigh 3.6 mm branch  5. At Knee 4 mm   6. High Calf 3.7 mm   7. Low Calf 3.3 mm branch  8. Ankle 2.8 mm branch   mm    mm    mm       Smiley Houseman, 12/16/2011, 10:03 AM

## 2011-12-16 NOTE — Progress Notes (Signed)
Subjective:  No chest pain or SOB  Objective:  Vital Signs in the last 24 hours: Temp:  [97.4 F (36.3 C)-98.3 F (36.8 C)] 98.3 F (36.8 C) (05/25 0400) Pulse Rate:  [76-94] 77  (05/25 0400) Resp:  [18-21] 20  (05/25 0400) BP: (93-139)/(51-80) 139/79 mmHg (05/25 0400) SpO2:  [97 %-99 %] 99 % (05/24 2015) Weight:  [93 kg (205 lb 0.4 oz)] 93 kg (205 lb 0.4 oz) (05/25 0400)  Intake/Output from previous day:  Intake/Output Summary (Last 24 hours) at 12/16/11 0757 Last data filed at 12/15/11 2358  Gross per 24 hour  Intake 521.35 ml  Output      0 ml  Net 521.35 ml    Physical Exam: General appearance: alert, cooperative and no distress Lungs: clear to auscultation bilaterally Heart: regular rate and rhythm and 2/6 syst mumur rt groin without hematoma   Rate: 77  Rhythm: normal sinus rhythm  Lab Results:  Basename 12/16/11 0534  WBC 3.9*  HGB 14.9  PLT 137*    Basename 12/16/11 0534  NA 137  K 4.2  CL 103  CO2 22  GLUCOSE 124*  BUN 14  CREATININE 0.85   No results found for this basename: TROPONINI:2,CK,MB:2 in the last 72 hours Hepatic Function Panel No results found for this basename: PROT,ALBUMIN,AST,ALT,ALKPHOS,BILITOT,BILIDIR,IBILI in the last 72 hours No results found for this basename: CHOL in the last 72 hours  Basename 12/14/11 1426  INR 1.12    Imaging: No results found.  Cardiac Studies:  Assessment/Plan:   Principal Problem:  *Unstable angina Active Problems:  CAD (coronary artery disease) with history of LAD & Diag athrectomy in 1994, now with severe 3 vesseal CAD by cath 12/14/11 for CABG  AS (aortic stenosis) moderate to severe by cath 12/14/11 for AVR   Chronic anticoagulation, on coumadin for pul emboli  HYPERLIPIDEMIA  HYPERTENSION  History of prior pulm embolism  Carotid artery disease, bilateral, asymptomatic  COPD (chronic obstructive pulmonary disease)   Plan- CABG, tissue AVR Wednesday. Tx to telemetry. Will allow pt to  shower.   Corine Shelter PA-C 12/16/2011, 7:57 AM    Agree with note written by Corine Shelter PAC  S/P L/R heart cath revealing LM/3 VD and moderate AS. Needs CABG/AVR. Appreciate DR. Gerhardt's consult. Surgery scheduled for Wed. Will continue IV hep. Transfer to tele. Exam benign. Labs ok.  Runell Gess 12/16/2011 9:01 AM

## 2011-12-16 NOTE — Progress Notes (Signed)
ANTICOAGULATION CONSULT NOTE - Follow Up Consult  Pharmacy Consult for heparin Indication: h/o PE  Labs:  Basename 12/16/11 0635 12/16/11 0534 12/15/11 2156 12/14/11 1426  HGB -- 14.9 -- --  HCT -- 42.4 -- --  PLT -- 137* -- --  APTT -- -- -- --  LABPROT -- -- -- 14.6  INR -- -- -- 1.12  HEPARINUNFRC 1.03* >2.00* 0.32 --  CREATININE -- 0.85 -- --  CKTOTAL -- -- -- --  CKMB -- -- -- --  TROPONINI -- -- -- --    Assessment: 78 year old male who had elevated heparin level this morning, even on second, verified draw, of 1.03 at rate of 1500 units/hour. No signs of bleeding and CBC appears normal compared to baseline.  Goal of Therapy:  Heparin level 0.3-0.7 units/ml   Plan:  1. Hold heparin for ~1 hour, to resume at 0930 5/25 2. Reduce heparin rate to 1400 units/hour 3. F/U heparin level @1730  5/25  Aariyah Sampey, [place] R PharmD 12/16/2011,8:47 AM

## 2011-12-16 NOTE — Progress Notes (Signed)
ANTICOAGULATION CONSULT NOTE - Follow Up Consult  Pharmacy Consult for heparin Indication: h/o PE  Labs:  Basename 12/16/11 1721 12/16/11 0635 12/16/11 0534 12/14/11 1426  HGB -- -- 14.9 --  HCT -- -- 42.4 --  PLT -- -- 137* --  APTT -- -- -- --  LABPROT -- -- -- 14.6  INR -- -- -- 1.12  HEPARINUNFRC 0.90* 1.03* >2.00* --  CREATININE -- -- 0.85 --  CKTOTAL -- -- -- --  CKMB -- -- -- --  TROPONINI -- -- -- --    Assessment: 78 year old male who had elevated heparin level this morning and heparin level still elevated despite rate change. No signs of bleeding and CBC appears normal compared to baseline.  Goal of Therapy:  Heparin level 0.3-0.7 units/ml   Plan:  1. Decrease heparin to 1200 units/hr. 2. Check heparin level 8 hours after rate change.  Woodfin Ganja PharmD 12/16/2011,7:08 PM

## 2011-12-17 LAB — CBC
HCT: 40.6 % (ref 39.0–52.0)
Hemoglobin: 13.9 g/dL (ref 13.0–17.0)
MCH: 32.1 pg (ref 26.0–34.0)
MCHC: 34.2 g/dL (ref 30.0–36.0)
MCV: 93.8 fL (ref 78.0–100.0)
Platelets: 150 10*3/uL (ref 150–400)
RBC: 4.33 MIL/uL (ref 4.22–5.81)
RDW: 12.1 % (ref 11.5–15.5)
WBC: 4.4 10*3/uL (ref 4.0–10.5)

## 2011-12-17 LAB — HEPARIN LEVEL (UNFRACTIONATED): Heparin Unfractionated: 0.37 IU/mL (ref 0.30–0.70)

## 2011-12-17 NOTE — Progress Notes (Signed)
ANTICOAGULATION CONSULT NOTE - Follow Up Consult  Pharmacy Consult for heparin Indication: h/o PE  Labs:  Basename 12/17/11 0525 12/17/11 0230 12/16/11 1721 12/16/11 1610 12/16/11 0534 12/14/11 1426  HGB -- 13.9 -- -- 14.9 --  HCT -- 40.6 -- -- 42.4 --  PLT -- 150 -- -- 137* --  APTT -- -- -- -- -- --  LABPROT -- -- -- -- -- 14.6  INR -- -- -- -- -- 1.12  HEPARINUNFRC 0.78* -- 0.90* 1.03* -- --  CREATININE -- -- -- -- 0.85 --  CKTOTAL -- -- -- -- -- --  CKMB -- -- -- -- -- --  TROPONINI -- -- -- -- -- --    Assessment: 78yo male remains slightly supratherapeutic on heparin after two rate decreases whereas initial two levels were therapeutic.  Goal of Therapy:  Heparin level 0.3-0.7 units/ml   Plan:  Will decrease heparin gtt by another 1 unit/kg/hr to 1100 units/hr and check level in 6hr.  Colleen Can PharmD BCPS 12/17/2011,6:29 AM

## 2011-12-17 NOTE — Progress Notes (Signed)
Subjective:  No CP/SOB  Objective:  Temp:  [97.3 F (36.3 C)-98.7 F (37.1 C)] 97.8 F (36.6 C) (05/26 0446) Pulse Rate:  [70-77] 70  (05/26 0446) Resp:  [18-20] 19  (05/26 0446) BP: (94-131)/(57-77) 119/71 mmHg (05/26 0446) SpO2:  [92 %-96 %] 93 % (05/26 0446) Weight change:   Intake/Output from previous day: 05/25 0701 - 05/26 0700 In: 420 [P.O.:420] Out: -   Intake/Output from this shift: Total I/O In: 120 [P.O.:120] Out: -   Physical Exam: General appearance: alert and cooperative Neck: no adenopathy, no carotid bruit, no JVD, supple, symmetrical, trachea midline and thyroid not enlarged, symmetric, no tenderness/mass/nodules Lungs: clear to auscultation bilaterally Heart: 2-3/6 SEM c/w AS Extremities: extremities normal, atraumatic, no cyanosis or edema  Lab Results: Results for orders placed during the hospital encounter of 12/14/11 (from the past 48 hour(s))  HEPARIN LEVEL (UNFRACTIONATED)     Status: Normal   Collection Time   12/15/11  2:38 PM      Component Value Range Comment   Heparin Unfractionated 0.64  0.30 - 0.70 (IU/mL)   HEPARIN LEVEL (UNFRACTIONATED)     Status: Normal   Collection Time   12/15/11  9:56 PM      Component Value Range Comment   Heparin Unfractionated 0.32  0.30 - 0.70 (IU/mL)   CBC     Status: Abnormal   Collection Time   12/16/11  5:34 AM      Component Value Range Comment   WBC 3.9 (*) 4.0 - 10.5 (K/uL)    RBC 4.59  4.22 - 5.81 (MIL/uL)    Hemoglobin 14.9  13.0 - 17.0 (g/dL)    HCT 16.1  09.6 - 04.5 (%)    MCV 92.4  78.0 - 100.0 (fL)    MCH 32.5  26.0 - 34.0 (pg)    MCHC 35.1  30.0 - 36.0 (g/dL)    RDW 40.9  81.1 - 91.4 (%)    Platelets 137 (*) 150 - 400 (K/uL)   HEPARIN LEVEL (UNFRACTIONATED)     Status: Abnormal   Collection Time   12/16/11  5:34 AM      Component Value Range Comment   Heparin Unfractionated >2.00 (*) 0.30 - 0.70 (IU/mL)   BASIC METABOLIC PANEL     Status: Abnormal   Collection Time   12/16/11  5:34 AM        Component Value Range Comment   Sodium 137  135 - 145 (mEq/L)    Potassium 4.2  3.5 - 5.1 (mEq/L)    Chloride 103  96 - 112 (mEq/L)    CO2 22  19 - 32 (mEq/L)    Glucose, Bld 124 (*) 70 - 99 (mg/dL)    BUN 14  6 - 23 (mg/dL)    Creatinine, Ser 7.82  0.50 - 1.35 (mg/dL)    Calcium 9.0  8.4 - 10.5 (mg/dL)    GFR calc non Af Amer 82 (*) >90 (mL/min)    GFR calc Af Amer >90  >90 (mL/min)   HEPARIN LEVEL (UNFRACTIONATED)     Status: Abnormal   Collection Time   12/16/11  6:35 AM      Component Value Range Comment   Heparin Unfractionated 1.03 (*) 0.30 - 0.70 (IU/mL)   HEPARIN LEVEL (UNFRACTIONATED)     Status: Abnormal   Collection Time   12/16/11  5:21 PM      Component Value Range Comment   Heparin Unfractionated 0.90 (*) 0.30 - 0.70 (IU/mL)  CBC     Status: Normal   Collection Time   12/17/11  2:30 AM      Component Value Range Comment   WBC 4.4  4.0 - 10.5 (K/uL)    RBC 4.33  4.22 - 5.81 (MIL/uL)    Hemoglobin 13.9  13.0 - 17.0 (g/dL)    HCT 16.1  09.6 - 04.5 (%)    MCV 93.8  78.0 - 100.0 (fL)    MCH 32.1  26.0 - 34.0 (pg)    MCHC 34.2  30.0 - 36.0 (g/dL)    RDW 40.9  81.1 - 91.4 (%)    Platelets 150  150 - 400 (K/uL)   HEPARIN LEVEL (UNFRACTIONATED)     Status: Abnormal   Collection Time   12/17/11  5:25 AM      Component Value Range Comment   Heparin Unfractionated 0.78 (*) 0.30 - 0.70 (IU/mL)     Imaging: Imaging results have been reviewed  Assessment/Plan:   1. Principal Problem: 2.  *Unstable angina 3. Active Problems: 4.  HYPERLIPIDEMIA 5.  HYPERTENSION 6.  History of prior pulm embolism 7.  CAD (coronary artery disease) with history of LAD & Diag athrectomy in 1994, now with severe 3 vesseal CAD by cath 12/14/11 for CABG 8.  AS (aortic stenosis) moderate to severe by cath 12/14/11 for AVR  9.  Chronic anticoagulation, on coumadin for pul emboli 10.  Carotid artery disease, bilateral, asymptomatic 11.  COPD by CXR 12.   Time Spent Directly with  Patient:  20 minutes  Length of Stay:  LOS: 3 days   LM/3 VD with mod AS for CABG/AVR Wed. On IV hep for prior H/O PE. We may D?C coumadin in future.  Runell Gess 12/17/2011, 9:09 AM

## 2011-12-17 NOTE — Progress Notes (Addendum)
Pt ambulated in hallway about 300 feet. Gait steady. Pt tolerated well.   Alfonso Ellis, RN

## 2011-12-17 NOTE — Progress Notes (Signed)
ANTICOAGULATION CONSULT NOTE - Follow Up Consult  Pharmacy Consult for heparin Indication: h/o PE  Labs:  Basename 12/17/11 1253 12/17/11 0525 12/17/11 0230 12/16/11 1721 12/16/11 0534 12/14/11 1426  HGB -- -- 13.9 -- 14.9 --  HCT -- -- 40.6 -- 42.4 --  PLT -- -- 150 -- 137* --  APTT -- -- -- -- -- --  LABPROT -- -- -- -- -- 14.6  INR -- -- -- -- -- 1.12  HEPARINUNFRC 0.83* 0.78* -- 0.90* -- --  CREATININE -- -- -- -- 0.85 --  CKTOTAL -- -- -- -- -- --  CKMB -- -- -- -- -- --  TROPONINI -- -- -- -- -- --    Assessment: 78yo male remains slightly supratherapeutic on heparin.  Goal of Therapy:  Heparin level 0.3-0.7 units/ml   Plan:  Will decrease heparin gtt to 950 units/hr and check level in 8hr.  Woodfin Ganja PharmD 12/17/2011,1:44 PM

## 2011-12-17 NOTE — Progress Notes (Signed)
ANTICOAGULATION CONSULT NOTE - Follow Up Consult  Pharmacy Consult for heparin Indication: h/o PE  Labs:  Basename 12/17/11 2123 12/17/11 1253 12/17/11 0525 12/17/11 0230 12/16/11 0534  HGB -- -- -- 13.9 14.9  HCT -- -- -- 40.6 42.4  PLT -- -- -- 150 137*  APTT -- -- -- -- --  LABPROT -- -- -- -- --  INR -- -- -- -- --  HEPARINUNFRC 0.37 0.83* 0.78* -- --  CREATININE -- -- -- -- 0.85  CKTOTAL -- -- -- -- --  CKMB -- -- -- -- --  TROPONINI -- -- -- -- --    Assessment/Plan: 78yo male difficult to manage on heparin, now therapeutic for h/o PE.  Will continue gtt at current rate and confirm stable with am labs.   Colleen Can PharmD BCPS 12/17/2011,10:45 PM

## 2011-12-18 LAB — CBC
HCT: 39.8 % (ref 39.0–52.0)
Hemoglobin: 13.8 g/dL (ref 13.0–17.0)
MCH: 32.4 pg (ref 26.0–34.0)
MCHC: 34.7 g/dL (ref 30.0–36.0)
MCV: 93.4 fL (ref 78.0–100.0)
Platelets: 150 10*3/uL (ref 150–400)
RBC: 4.26 MIL/uL (ref 4.22–5.81)
RDW: 12.2 % (ref 11.5–15.5)
WBC: 4.1 10*3/uL (ref 4.0–10.5)

## 2011-12-18 NOTE — Progress Notes (Signed)
ANTICOAGULATION CONSULT NOTE - Follow Up Consult  Pharmacy Consult for Heparin Indication: pulmonary embolus (history)  Allergies  Allergen Reactions  . Sulfonamide Derivatives Rash    "I don't even remember having problem; dr says I did"    Patient Measurements: Height: 5\' 9"  (175.3 cm) Weight: 205 lb 0.4 oz (93 kg) IBW/kg (Calculated) : 70.7  Heparin Dosing Weight: 90 kg  Vital Signs: Temp: 98.3 F (36.8 C) (05/27 0430) Temp src: Oral (05/27 0430) BP: 139/78 mmHg (05/27 1044) Pulse Rate: 73  (05/27 0430)  Labs:  Basename 12/18/11 0525 12/17/11 2123 12/17/11 1253 12/17/11 0230 12/16/11 0534  HGB 13.8 -- -- 13.9 --  HCT 39.8 -- -- 40.6 42.4  PLT 150 -- -- 150 137*  APTT -- -- -- -- --  LABPROT -- -- -- -- --  INR -- -- -- -- --  HEPARINUNFRC 0.37 0.37 0.83* -- --  CREATININE -- -- -- -- 0.85  CKTOTAL -- -- -- -- --  CKMB -- -- -- -- --  TROPONINI -- -- -- -- --    Estimated Creatinine Clearance: 81.9 ml/min (by C-G formula based on Cr of 0.85).     Assessment: 78yo male s/p cath 12/14/11 and found to have 3-vessel disease and moderate aortic stenosis.  Awaiting CABG and mechanical AVR, likely Wednesday. In the meantime, patient to continue heparin bridge while Coumadin is on hold.  Heparin level therapeutic, no bleeding documented.   Goal of Therapy:  Heparin level 0.3-0.7 units/ml Monitor platelets by anticoagulation protocol: Yes     Plan:  - Continue heparin gtt at 950 units/hr - Daily HL / CBC     Soriyah Osberg D. Laney Potash, PharmD, BCPS Pager:  [phone] 12/18/2011, 1:06 PM

## 2011-12-18 NOTE — Progress Notes (Signed)
Subjective:  No chest painor SOB  Objective:  Vital Signs in the last 24 hours: Temp:  [97.7 F (36.5 C)-98.7 F (37.1 C)] 98.3 F (36.8 C) (05/27 0430) Pulse Rate:  [73-82] 73  (05/27 0430) Resp:  [17-19] 19  (05/27 0430) BP: (119-144)/(63-70) 119/63 mmHg (05/27 0430) SpO2:  [94 %-95 %] 95 % (05/27 0430)  Intake/Output from previous day:  Intake/Output Summary (Last 24 hours) at 12/18/11 0835 Last data filed at 12/18/11 0600  Gross per 24 hour  Intake 769.54 ml  Output      0 ml  Net 769.54 ml    Physical Exam: General appearance: alert, cooperative and no distress Lungs: clear to auscultation bilaterally Heart: regular rate and rhythm and 2/6 sytolic murmur   Rate: 74  Rhythm: normal sinus rhythm  Lab Results:  Basename 12/18/11 0525 12/17/11 0230  WBC 4.1 4.4  HGB 13.8 13.9  PLT 150 150    Basename 12/16/11 0534  NA 137  K 4.2  CL 103  CO2 22  GLUCOSE 124*  BUN 14  CREATININE 0.85   No results found for this basename: TROPONINI:2,CK,MB:2 in the last 72 hours Hepatic Function Panel No results found for this basename: PROT,ALBUMIN,AST,ALT,ALKPHOS,BILITOT,BILIDIR,IBILI in the last 72 hours No results found for this basename: CHOL in the last 72 hours No results found for this basename: INR in the last 72 hours  Imaging: Imaging results have been reviewed  Cardiac Studies:  Assessment/Plan:   Principal Problem:  *Unstable angina Active Problems:  CAD (coronary artery disease) with history of LAD & Diag athrectomy in 1994, now with severe 3 vesseal CAD by cath 12/14/11 for CABG  AS (aortic stenosis) moderate to severe by cath 12/14/11 for AVR   Chronic anticoagulation, on coumadin for pul emboli  HYPERLIPIDEMIA  HYPERTENSION  History of prior pulm embolism  Carotid artery disease, bilateral, asymptomatic  COPD by CXR   Plan- CABG tissue AVR Wednesday. Will discuss long term Coumadin with Dr Allyson Sabal.   Corine Shelter PA-C 12/18/2011, 8:35  AM    Agree with note written by Corine Shelter [facility]  Looks great. No CP/SOB. On IV hep. Exam benign. For CABG/AVR Wed.  Runell Gess 12/18/2011 8:40 AM

## 2011-12-19 ENCOUNTER — Inpatient Hospital Stay (HOSPITAL_COMMUNITY): Payer: Medicare Other

## 2011-12-19 ENCOUNTER — Encounter (HOSPITAL_COMMUNITY): Payer: Medicare Other

## 2011-12-19 DIAGNOSIS — I2510 Atherosclerotic heart disease of native coronary artery without angina pectoris: Secondary | ICD-10-CM

## 2011-12-19 LAB — URINALYSIS, ROUTINE W REFLEX MICROSCOPIC
Bilirubin Urine: NEGATIVE
Glucose, UA: 100 mg/dL — AB
Hgb urine dipstick: NEGATIVE
Specific Gravity, Urine: 1.021 (ref 1.005–1.030)
pH: 6 (ref 5.0–8.0)

## 2011-12-19 LAB — CBC
HCT: 40.5 % (ref 39.0–52.0)
Hemoglobin: 14.1 g/dL (ref 13.0–17.0)
MCH: 32.3 pg (ref 26.0–34.0)
MCHC: 34.8 g/dL (ref 30.0–36.0)
MCV: 92.7 fL (ref 78.0–100.0)
Platelets: 147 10*3/uL — ABNORMAL LOW (ref 150–400)
RBC: 4.37 MIL/uL (ref 4.22–5.81)
RDW: 12.1 % (ref 11.5–15.5)
WBC: 4.3 10*3/uL (ref 4.0–10.5)

## 2011-12-19 LAB — TYPE AND SCREEN
ABO/RH(D): AB POS
Antibody Screen: NEGATIVE

## 2011-12-19 LAB — HEPARIN LEVEL (UNFRACTIONATED): Heparin Unfractionated: 0.27 IU/mL — ABNORMAL LOW (ref 0.30–0.70)

## 2011-12-19 MED ORDER — ATORVASTATIN CALCIUM 40 MG PO TABS
40.0000 mg | ORAL_TABLET | Freq: Every day | ORAL | Status: DC
  Administered 2011-12-19 – 2011-12-25 (×5): 40 mg via ORAL
  Filled 2011-12-19 (×11): qty 1

## 2011-12-19 MED ORDER — DEXTROSE 5 % IV SOLN
0.5000 ug/min | INTRAVENOUS | Status: DC
  Filled 2011-12-19: qty 4

## 2011-12-19 MED ORDER — DOPAMINE-DEXTROSE 3.2-5 MG/ML-% IV SOLN
2.0000 ug/kg/min | INTRAVENOUS | Status: DC
  Filled 2011-12-19: qty 250

## 2011-12-19 MED ORDER — TRANEXAMIC ACID (OHS) BOLUS VIA INFUSION
15.0000 mg/kg | INTRAVENOUS | Status: AC
  Administered 2011-12-20: 930 mg via INTRAVENOUS
  Filled 2011-12-19: qty 1395

## 2011-12-19 MED ORDER — TEMAZEPAM 15 MG PO CAPS: 15.0000 mg | ORAL_CAPSULE | Freq: Once | ORAL | Status: AC | PRN

## 2011-12-19 MED ORDER — POTASSIUM CHLORIDE 2 MEQ/ML IV SOLN
80.0000 meq | INTRAVENOUS | Status: DC
  Filled 2011-12-19: qty 40

## 2011-12-19 MED ORDER — TRANEXAMIC ACID 100 MG/ML IV SOLN
1.5000 mg/kg/h | INTRAVENOUS | Status: AC
  Administered 2011-12-20: 1.5 mg/kg/h via INTRAVENOUS
  Filled 2011-12-19: qty 25

## 2011-12-19 MED ORDER — DEXTROSE 5 % IV SOLN
750.0000 mg | INTRAVENOUS | Status: DC
  Filled 2011-12-19: qty 750

## 2011-12-19 MED ORDER — CHLORHEXIDINE GLUCONATE 4 % EX LIQD
60.0000 mL | Freq: Once | CUTANEOUS | Status: AC
  Administered 2011-12-20: 4 via TOPICAL
  Filled 2011-12-19: qty 60

## 2011-12-19 MED ORDER — BISACODYL 5 MG PO TBEC
5.0000 mg | DELAYED_RELEASE_TABLET | Freq: Once | ORAL | Status: AC
  Administered 2011-12-19: 5 mg via ORAL
  Filled 2011-12-19: qty 1

## 2011-12-19 MED ORDER — PHENYLEPHRINE HCL 10 MG/ML IJ SOLN
30.0000 ug/min | INTRAVENOUS | Status: AC
  Administered 2011-12-20: 25 ug/min via INTRAVENOUS
  Filled 2011-12-19: qty 2

## 2011-12-19 MED ORDER — CHLORHEXIDINE GLUCONATE 4 % EX LIQD
60.0000 mL | Freq: Once | CUTANEOUS | Status: AC
  Administered 2011-12-19: 4 via TOPICAL
  Filled 2011-12-19: qty 60

## 2011-12-19 MED ORDER — SODIUM CHLORIDE 0.9 % IV SOLN
INTRAVENOUS | Status: AC
  Administered 2011-12-20: 1.6 [IU]/h via INTRAVENOUS
  Filled 2011-12-19: qty 1

## 2011-12-19 MED ORDER — METOPROLOL TARTRATE 12.5 MG HALF TABLET
12.5000 mg | ORAL_TABLET | Freq: Once | ORAL | Status: AC
  Administered 2011-12-20: 12.5 mg via ORAL
  Filled 2011-12-19: qty 1

## 2011-12-19 MED ORDER — MAGNESIUM SULFATE 50 % IJ SOLN
40.0000 meq | INTRAMUSCULAR | Status: DC
  Filled 2011-12-19: qty 10

## 2011-12-19 MED ORDER — TRANEXAMIC ACID (OHS) PUMP PRIME SOLUTION
2.0000 mg/kg | INTRAVENOUS | Status: DC
  Filled 2011-12-19: qty 1.86

## 2011-12-19 MED ORDER — SODIUM CHLORIDE 0.9 % IV SOLN
0.1000 ug/kg/h | INTRAVENOUS | Status: AC
  Administered 2011-12-20: .2 ug/kg/h via INTRAVENOUS
  Filled 2011-12-19: qty 4

## 2011-12-19 MED ORDER — DEXTROSE 5 % IV SOLN
1.5000 g | INTRAVENOUS | Status: AC
  Administered 2011-12-20: .75 g via INTRAVENOUS
  Administered 2011-12-20: 1.5 g via INTRAVENOUS
  Filled 2011-12-19: qty 1.5

## 2011-12-19 MED ORDER — VANCOMYCIN HCL 1000 MG IV SOLR
1500.0000 mg | INTRAVENOUS | Status: AC
  Administered 2011-12-20: 1500 mg via INTRAVENOUS
  Filled 2011-12-19: qty 1500

## 2011-12-19 MED ORDER — SODIUM BICARBONATE 8.4 % IV SOLN
INTRAVENOUS | Status: AC
  Administered 2011-12-20: 11:00:00
  Filled 2011-12-19: qty 2.5

## 2011-12-19 MED ORDER — NITROGLYCERIN IN D5W 200-5 MCG/ML-% IV SOLN
2.0000 ug/min | INTRAVENOUS | Status: DC
  Filled 2011-12-19: qty 250

## 2011-12-19 NOTE — Progress Notes (Signed)
[facility]  Subjective: No Complaints  Objective: Vital signs in last 24 hours: Temp:  [97.4 F (36.3 C)-98.4 F (36.9 C)] 98.4 F (36.9 C) (05/28 0512) Pulse Rate:  [74-82] 74  (05/28 0512) Resp:  [18] 18  (05/28 0512) BP: (104-126)/(64-73) 116/73 mmHg (05/28 0512) SpO2:  [96 %] 96 % (05/28 0512) Last BM Date: 12/18/11  Intake/Output from previous day: 05/27 0701 - 05/28 0700 In: 720 [P.O.:720] Out: -  Intake/Output this shift: Total I/O In: 360 [P.O.:360] Out: -   Medications Current Facility-Administered Medications  Medication Dose Route Frequency Provider Last Rate Last Dose  . acetaminophen (TYLENOL) tablet 650 mg  650 mg Oral Q4H PRN Runell Gess, MD      . amLODipine (NORVASC) tablet 5 mg  5 mg Oral BID Runell Gess, MD   5 mg at 12/19/11 1017  . aspirin chewable tablet 81 mg  81 mg Oral Daily Runell Gess, MD   81 mg at 12/19/11 1017  . bisacodyl (DULCOLAX) EC tablet 5 mg  5 mg Oral Once [name], MD   5 mg at 12/19/11 1016  . chlorhexidine (HIBICLENS) 4 % liquid 4 application  60 mL Topical Once [name], MD      . chlorhexidine (HIBICLENS) 4 % liquid 4 application  60 mL Topical Once [name], MD      . heparin ADULT infusion 100 units/mL (25000 units/250 mL)  1,050 Units/hr Intravenous Continuous Colleen Can, PHARMD 10.5 mL/hr at 12/19/11 0710 1,050 Units/hr at 12/19/11 0710  . metoprolol tartrate (LOPRESSOR) tablet 12.5 mg  12.5 mg Oral Once [name], MD      . metoprolol tartrate (LOPRESSOR) tablet 25 mg  25 mg Oral BID Eda Paschal [place], PA   25 mg at 12/19/11 1017  . morphine 2 MG/ML injection 1 mg  1 mg Intravenous Q1H PRN Runell Gess, MD      . ondansetron [facility]) injection 4 mg  4 mg Intravenous Q6H PRN Runell Gess, MD      . temazepam (RESTORIL) capsule 15 mg  15 mg Oral Once PRN [name], MD      . DISCONTD: lisinopril (PRINIVIL,ZESTRIL) tablet 10 mg   10 mg Oral Daily Runell Gess, MD   10 mg at 12/18/11 1044    PE: General appearance: alert, cooperative and no distress Lungs: clear to auscultation bilaterally Heart: regular rate and rhythm, 2/6 halosystolic MM. Extremities: 1+ LEE Pulses: 2+ and symmetric   Lab Results:   Basename 12/19/11 0601 12/18/11 0525 12/17/11 0230  WBC 4.3 4.1 4.4  HGB 14.1 13.8 13.9  HCT 40.5 39.8 40.6  PLT 147* 150 150    Assessment/Plan   Principal Problem:  *Unstable angina Active Problems:  HYPERLIPIDEMIA  HYPERTENSION  History of prior pulm embolism  CAD (coronary artery disease) with history of LAD & Diag athrectomy in 1994, now with severe 3 vesseal CAD by cath 12/14/11 for CABG  AS (aortic stenosis) moderate to severe by cath 12/14/11 for AVR   Chronic anticoagulation, on coumadin for pul emboli  Carotid artery disease, bilateral, asymptomatic  COPD by CXR  Plan:  No complaints.  CABG and AVR tomorrow.  BP controlled and stable.   LOS: 5 days    HAGER, BRYAN 12/19/2011 11:02 AM  I have seen & examined the patient this AM. He is stable & awaiting CABG/AVR.  On ASA, BB, & CCB  As well as IV Heparin Will add statin as part of his post-op / d/c medications. Will likely need to be re-anticoagulated, at least short term post AVR, which will allow time to determine need for long term A/C for Afib -- per Dr. Dorma Russell.  Marykay Lex, M.D., M.S. [facility] [address] [place], [place]  [address]  [phone] Pager # [phone]  12/19/2011 11:48 AM

## 2011-12-19 NOTE — Progress Notes (Signed)
ANTICOAGULATION CONSULT NOTE - Follow Up Consult  Pharmacy Consult for heparin Indication: h/o PE  Labs:  Basename 12/19/11 0601 12/18/11 0525 12/17/11 2123 12/17/11 0230  HGB 14.1 13.8 -- --  HCT 40.5 39.8 -- 40.6  PLT 147* 150 -- 150  APTT -- -- -- --  LABPROT -- -- -- --  INR -- -- -- --  HEPARINUNFRC 0.27* 0.37 0.37 --  CREATININE -- -- -- --  CKTOTAL -- -- -- --  CKMB -- -- -- --  TROPONINI -- -- -- --    Assessment: 78yo male now subtherapeutic on heparin for h/o PE after two levels at goal.  Goal of Therapy:  Heparin level 0.3-0.7 units/ml   Plan:  Will increase heparin gtt by 1 unit/kg/hr and check level in 6hr.  Colleen Can PharmD BCPS 12/19/2011,7:01 AM

## 2011-12-19 NOTE — Progress Notes (Signed)
Patient ID: Curtis Brennan, male   DOB: 1932-04-08, 78 y.o.   MRN: [id number]                   [address]            [place] [address]          [phone]     5 Days Post-Op Procedure(s) (LRB): LEFT HEART CATHETERIZATION WITH CORONARY ANGIOGRAM (N/A) RIGHT HEART CATH ()  LOS: 5 days   Subjective: Stable at rest in hospital, no chest pain currenly  Objective: Vital signs in last 24 hours: Patient Vitals for the past 24 hrs:  BP Temp Temp src Pulse Resp SpO2  12/19/11 1530 113/67 mmHg 97.9 F (36.6 C) Oral 80  18  94 %  12/19/11 0512 116/73 mmHg 98.4 F (36.9 C) Oral 74  18  96 %  12/18/11 2135 104/64 mmHg 98.2 F (36.8 C) Oral 82  18  96 %    Filed Weights   12/14/11 1413 12/15/11 0400 12/16/11 0400  Weight: 208 lb (94.348 kg) 208 lb 5.4 oz (94.5 kg) 205 lb 0.4 oz (93 kg)    Hemodynamic parameters for last 24 hours:    Intake/Output from previous day: 05/27 0701 - 05/28 0700 In: 720 [P.O.:720] Out: -  Intake/Output this shift: Total I/O In: 360 [P.O.:360] Out: -   Scheduled Meds:   . amLODipine  5 mg Oral BID  . aspirin  81 mg Oral Daily  . atorvastatin  40 mg Oral q1800  . bisacodyl  5 mg Oral Once  . cefUROXime (ZINACEF)  IV  1.5 g Intravenous To OR  . cefUROXime (ZINACEF)  IV  750 mg Intravenous To OR  . chlorhexidine  60 mL Topical Once  . chlorhexidine  60 mL Topical Once  . dexmedetomidine (PRECEDEX) IV infusion for high rates  0.1-0.7 mcg/kg/hr Intravenous To OR  . DOPamine  2-20 mcg/kg/min Intravenous To OR  . epinephrine  0.5-20 mcg/min Intravenous To OR  . insulin (NOVOLIN-R) infusion   Intravenous To OR  . magnesium sulfate  40 mEq Other To OR  . metoprolol tartrate  12.5 mg Oral Once  . metoprolol tartrate  25 mg Oral BID  . nitroGLYCERIN  2-200 mcg/min Intravenous To OR  . nitroglycerin-nicardipine-HEPARIN-sodium bicarbonate irrigation for artery spasm   Irrigation To OR  . phenylephrine (NEO-SYNEPHRINE) Adult infusion   30-200 mcg/min Intravenous To OR  . potassium chloride  80 mEq Other To OR  . tranexamic acid  15 mg/kg Intravenous To OR  . tranexamic acid  2 mg/kg Intracatheter To OR  . tranexamic acid (CYKLOKAPRON) infusion ([facility])  1.5 mg/kg/hr Intravenous To OR  . vancomycin  1,500 mg Intravenous To OR  . DISCONTD: lisinopril  10 mg Oral Daily   Continuous Infusions:   . heparin 1,050 Units/hr (12/19/11 0710)   PRN Meds:.acetaminophen, morphine injection, ondansetron (ZOFRAN) IV, temazepam  General appearance: alert, cooperative and no distress Neurologic: intact Heart: normal apical impulse and systolic murmur: holosystolic 4/6, crescendo throughout the precordium Lungs: clear to auscultation bilaterally Abdomen: soft, non-tender; bowel sounds normal; no masses,  no organomegaly Extremities: extremities normal, atraumatic, no cyanosis or edema and Homans sign is negative, no sign of DVT  Lab Results: CBC: Basename 12/19/11 0601 12/18/11 0525  WBC 4.3 4.1  HGB 14.1 13.8  HCT 40.5 39.8  PLT 147* 150   BMET: No results found for this basename: NA:2,K:2,CL:2,CO2:2,GLUCOSE:2,BUN:2,CREATININE:2,CALCIUM:2 in the last 72 hours  PT/INR:  No results found for this basename: LABPROT,INR in the last 72 hours   Radiology Dg Chest 2 View  12/11/2011  *RADIOLOGY REPORT*  Clinical Data: Chest pain.  Short of breath.  CHEST - 2 VIEW  Comparison: 05/19/2010  Findings: Borderline cardiomegaly.  Nodular interstitial changes throughout both lungs are stable.  Low volumes.  Pleural changes at the right base.  Intact thoracic spine.  No pleural effusion.  IMPRESSION: Cardiomegaly and chronic interstitial changes.  Original Report Authenticated By: Curtis Brennan, M.D.   Assessment/Plan: S/P Procedure(s) (LRB): LEFT HEART CATHETERIZATION WITH CORONARY ANGIOGRAM (N/A) RIGHT HEART CATH ()  AVR with tissue valve and CABG tomorrow. I have again reviewed with patient and family the planned procedure and the  reason. The goals risks and alternatives of the planned surgical procedure AVR, CABG have been discussed with the patient in detail. The risks of the procedure including death, infection, stroke, myocardial infarction, bleeding, blood transfusion heart block and need for pacer have all been discussed specifically. I have quoted Curtis Brennan a 5 % of perioperative mortality and a complication rate as high as 25%. The patient's questions have been answered.Curtis Brennan is willing to proceed with the planned procedure. At patients request have also discussed with his granddaughter who is NP in cardiac surgery at [facility]. Plan to proceed in am.     Curtis Ovens MD 12/19/2011 3:41 PM

## 2011-12-19 NOTE — Progress Notes (Signed)
  ANTICOAGULATION CONSULT NOTE - Follow Up Consult  Pharmacy Consult for Heparin Indication: Hx PE  Allergies  Allergen Reactions  . Sulfonamide Derivatives Rash    "I don't even remember having problem; dr says I did"    Patient Measurements: Height: 5\' 9"  (175.3 cm) Weight: 205 lb 0.4 oz (93 kg) IBW/kg (Calculated) : 70.7  Heparin Dosing Weight: 90kg  Vital Signs: Temp: 98.4 F (36.9 C) (05/28 0512) Temp src: Oral (05/28 0512) BP: 116/73 mmHg (05/28 0512) Pulse Rate: 74  (05/28 0512)  Labs:  Basename 12/19/11 1250 12/19/11 0601 12/18/11 0525 12/17/11 0230  HGB -- 14.1 13.8 --  HCT -- 40.5 39.8 40.6  PLT -- 147* 150 150  APTT -- -- -- --  LABPROT -- -- -- --  INR -- -- -- --  HEPARINUNFRC 0.52 0.27* 0.37 --  CREATININE -- -- -- --  CKTOTAL -- -- -- --  CKMB -- -- -- --  TROPONINI -- -- -- --    Estimated Creatinine Clearance: 81.9 ml/min (by C-G formula based on Cr of 0.85).   Medications:  Heparin 1050 units/hr  Assessment: 79yom on heparin for hx of PE while Coumadin is on hold for CABG/AVR tomorrow (5/29). Heparin level (0.52) is therapeutic after rate increase this AM. - H/H and Plts stable - No significant bleeding reported  Goal of Therapy:  Heparin level 0.3-0.7 units/ml Monitor platelets by anticoagulation protocol: Yes   Plan:  1. Continue heparin drip 1050 units/hr until discontinuation time for OR per MD order 2. Will not check AM heparin level unless OR case delayed  Cleon Dew [phone] 12/19/2011,2:22 PM

## 2011-12-20 ENCOUNTER — Encounter (HOSPITAL_COMMUNITY)
Admission: RE | Disposition: A | Discharge status: Home / Self Care | Payer: Self-pay | Source: Ambulatory Visit | Attending: Cardiothoracic Surgery

## 2011-12-20 ENCOUNTER — Inpatient Hospital Stay (HOSPITAL_COMMUNITY): Payer: Medicare Other

## 2011-12-20 ENCOUNTER — Encounter (HOSPITAL_COMMUNITY): Payer: Self-pay | Admitting: Anesthesiology

## 2011-12-20 ENCOUNTER — Inpatient Hospital Stay (HOSPITAL_COMMUNITY): Payer: Medicare Other | Admitting: Anesthesiology

## 2011-12-20 DIAGNOSIS — I359 Nonrheumatic aortic valve disorder, unspecified: Secondary | ICD-10-CM

## 2011-12-20 DIAGNOSIS — I2510 Atherosclerotic heart disease of native coronary artery without angina pectoris: Secondary | ICD-10-CM

## 2011-12-20 HISTORY — PX: CYSTOSCOPY: SHX5120

## 2011-12-20 HISTORY — PX: AORTIC VALVE REPLACEMENT: SHX41

## 2011-12-20 HISTORY — PX: CORONARY ARTERY BYPASS GRAFT: SHX141

## 2011-12-20 LAB — HEMOGLOBIN A1C
Hgb A1c MFr Bld: 6.2 % — ABNORMAL HIGH (ref <5.7)
Mean Plasma Glucose: 131 mg/dL — ABNORMAL HIGH (ref <117)

## 2011-12-20 LAB — POCT I-STAT 4, (NA,K, GLUC, HGB,HCT)
Glucose, Bld: 108 mg/dL — ABNORMAL HIGH (ref 70–99)
Glucose, Bld: 219 mg/dL — ABNORMAL HIGH (ref 70–99)
HCT: 38 % — ABNORMAL LOW (ref 39.0–52.0)
HCT: 28 % — ABNORMAL LOW (ref 39.0–52.0)
HCT: 37 % — ABNORMAL LOW (ref 39.0–52.0)
HCT: 32 % — ABNORMAL LOW (ref 39.0–52.0)
Hemoglobin: 12.6 g/dL — ABNORMAL LOW (ref 13.0–17.0)
Hemoglobin: 10.9 g/dL — ABNORMAL LOW (ref 13.0–17.0)
Hemoglobin: 11.2 g/dL — ABNORMAL LOW (ref 13.0–17.0)
Potassium: 4.7 mEq/L (ref 3.5–5.1)
Potassium: 5.3 mEq/L — ABNORMAL HIGH (ref 3.5–5.1)
Potassium: 5.5 mEq/L — ABNORMAL HIGH (ref 3.5–5.1)
Potassium: 4.4 mEq/L (ref 3.5–5.1)
Sodium: 139 mEq/L (ref 135–145)
Sodium: 137 mEq/L (ref 135–145)
Sodium: 135 mEq/L (ref 135–145)
Sodium: 135 mEq/L (ref 135–145)
Sodium: 138 mEq/L (ref 135–145)
Sodium: 138 mEq/L (ref 135–145)

## 2011-12-20 LAB — BASIC METABOLIC PANEL
BUN: 16 mg/dL (ref 6–23)
CO2: 27 mEq/L (ref 19–32)
Calcium: 9.3 mg/dL (ref 8.4–10.5)
Chloride: 103 mEq/L (ref 96–112)
Creatinine, Ser: 1 mg/dL (ref 0.50–1.35)
GFR calc Af Amer: 82 mL/min — ABNORMAL LOW (ref >90)
GFR calc non Af Amer: 70 mL/min — ABNORMAL LOW (ref >90)
Glucose, Bld: 109 mg/dL — ABNORMAL HIGH (ref 70–99)
Potassium: 4.3 mEq/L (ref 3.5–5.1)
Sodium: 138 mEq/L (ref 135–145)

## 2011-12-20 LAB — POCT I-STAT 3, ART BLOOD GAS (G3+)
Acid-base deficit: 3 mmol/L — ABNORMAL HIGH (ref 0.0–2.0)
Bicarbonate: 21.6 mEq/L (ref 20.0–24.0)
O2 Saturation: 100 %
Patient temperature: 36.1
TCO2: 26 mmol/L (ref 0–100)
TCO2: 23 mmol/L (ref 0–100)
pCO2 arterial: 40.9 mmHg (ref 35.0–45.0)
pH, Arterial: 7.385 (ref 7.350–7.450)
pH, Arterial: 7.382 (ref 7.350–7.450)
pO2, Arterial: 366 mmHg — ABNORMAL HIGH (ref 80.0–100.0)
pO2, Arterial: 325 mmHg — ABNORMAL HIGH (ref 80.0–100.0)

## 2011-12-20 LAB — PLATELET COUNT: Platelets: 131 10*3/uL — ABNORMAL LOW (ref 150–400)

## 2011-12-20 LAB — CBC
HCT: 40.4 % (ref 39.0–52.0)
Hemoglobin: 14 g/dL (ref 13.0–17.0)
Hemoglobin: 11.9 g/dL — ABNORMAL LOW (ref 13.0–17.0)
MCH: 32.2 pg (ref 26.0–34.0)
MCHC: 34.7 g/dL (ref 30.0–36.0)
MCHC: 35.4 g/dL (ref 30.0–36.0)
MCV: 92.9 fL (ref 78.0–100.0)
Platelets: 147 10*3/uL — ABNORMAL LOW (ref 150–400)
RBC: 4.35 MIL/uL (ref 4.22–5.81)
RDW: 12.1 % (ref 11.5–15.5)
WBC: 4.8 10*3/uL (ref 4.0–10.5)
WBC: 11.9 10*3/uL — ABNORMAL HIGH (ref 4.0–10.5)

## 2011-12-20 LAB — HEMOGLOBIN AND HEMATOCRIT, BLOOD
HCT: 33 % — ABNORMAL LOW (ref 39.0–52.0)
Hemoglobin: 11.8 g/dL — ABNORMAL LOW (ref 13.0–17.0)

## 2011-12-20 LAB — APTT: aPTT: 30 seconds (ref 24–37)

## 2011-12-20 LAB — PROTIME-INR
INR: 1.57 — ABNORMAL HIGH (ref 0.00–1.49)
Prothrombin Time: 19.1 seconds — ABNORMAL HIGH (ref 11.6–15.2)

## 2011-12-20 LAB — POCT I-STAT GLUCOSE: Glucose, Bld: 125 mg/dL — ABNORMAL HIGH (ref 70–99)

## 2011-12-20 SURGERY — CORONARY ARTERY BYPASS GRAFTING (CABG)
Anesthesia: General | Site: Chest | Wound class: Clean

## 2011-12-20 MED ORDER — BISACODYL 5 MG PO TBEC
10.0000 mg | DELAYED_RELEASE_TABLET | Freq: Every day | ORAL | Status: DC
  Administered 2011-12-21: 10 mg via ORAL
  Filled 2011-12-20: qty 2

## 2011-12-20 MED ORDER — ALBUMIN HUMAN 5 % IV SOLN
INTRAVENOUS | Status: DC | PRN
  Administered 2011-12-20: 15:00:00 via INTRAVENOUS

## 2011-12-20 MED ORDER — FENTANYL CITRATE 0.05 MG/ML IJ SOLN
INTRAMUSCULAR | Status: DC | PRN
  Administered 2011-12-20: 350 ug via INTRAVENOUS
  Administered 2011-12-20 (×3): 250 ug via INTRAVENOUS
  Administered 2011-12-20 (×4): 100 ug via INTRAVENOUS

## 2011-12-20 MED ORDER — SODIUM CHLORIDE 0.9 % IV SOLN
INTRAVENOUS | Status: DC
  Administered 2011-12-20: 20 mL via INTRAVENOUS

## 2011-12-20 MED ORDER — ACETAMINOPHEN 500 MG PO TABS
1000.0000 mg | ORAL_TABLET | Freq: Four times a day (QID) | ORAL | Status: DC
  Administered 2011-12-21 – 2011-12-22 (×4): 1000 mg via ORAL
  Filled 2011-12-20 (×10): qty 2

## 2011-12-20 MED ORDER — SODIUM CHLORIDE 0.9 % IJ SOLN
OROMUCOSAL | Status: DC | PRN
  Administered 2011-12-20 (×3): via TOPICAL

## 2011-12-20 MED ORDER — FAMOTIDINE IN NACL 20-0.9 MG/50ML-% IV SOLN: 20.0000 mg | Freq: Two times a day (BID) | INTRAVENOUS | Status: AC

## 2011-12-20 MED ORDER — LACTATED RINGERS IV SOLN: 500.0000 mL | Freq: Once | INTRAVENOUS | Status: AC | PRN

## 2011-12-20 MED ORDER — BISACODYL 10 MG RE SUPP: 10.0000 mg | Freq: Every day | RECTAL | Status: DC

## 2011-12-20 MED ORDER — LACTATED RINGERS IV SOLN
INTRAVENOUS | Status: DC
  Administered 2011-12-21: 20 mL/h via INTRAVENOUS

## 2011-12-20 MED ORDER — HEPARIN SODIUM (PORCINE) 1000 UNIT/ML IJ SOLN
INTRAMUSCULAR | Status: DC | PRN
  Administered 2011-12-20: 43000 [IU] via INTRAVENOUS

## 2011-12-20 MED ORDER — POTASSIUM CHLORIDE 10 MEQ/50ML IV SOLN: 10.0000 meq | INTRAVENOUS | Status: AC

## 2011-12-20 MED ORDER — PROTAMINE SULFATE 10 MG/ML IV SOLN
INTRAVENOUS | Status: DC | PRN
  Administered 2011-12-20: 200 mg via INTRAVENOUS
  Administered 2011-12-20: 25 mg via INTRAVENOUS
  Administered 2011-12-20: 200 mg via INTRAVENOUS

## 2011-12-20 MED ORDER — METOPROLOL TARTRATE 25 MG/10 ML ORAL SUSPENSION
12.5000 mg | Freq: Two times a day (BID) | ORAL | Status: DC
  Filled 2011-12-20 (×5): qty 5

## 2011-12-20 MED ORDER — DOCUSATE SODIUM 100 MG PO CAPS
200.0000 mg | ORAL_CAPSULE | Freq: Every day | ORAL | Status: DC
  Administered 2011-12-21 – 2011-12-27 (×6): 200 mg via ORAL
  Filled 2011-12-20 (×6): qty 2

## 2011-12-20 MED ORDER — PROPOFOL 10 MG/ML IV EMUL
INTRAVENOUS | Status: DC | PRN
  Administered 2011-12-20: 50 mg via INTRAVENOUS

## 2011-12-20 MED ORDER — DEXTROSE 5 % IV SOLN
0.0000 ug/min | INTRAVENOUS | Status: DC
  Administered 2011-12-20: 25 ug/min via INTRAVENOUS
  Filled 2011-12-20: qty 2

## 2011-12-20 MED ORDER — HEMOSTATIC AGENTS (NO CHARGE) OPTIME
TOPICAL | Status: DC | PRN
  Administered 2011-12-20: 1 via TOPICAL

## 2011-12-20 MED ORDER — SODIUM CHLORIDE 0.45 % IV SOLN
INTRAVENOUS | Status: DC
  Administered 2011-12-20: 20 mL via INTRAVENOUS

## 2011-12-20 MED ORDER — MORPHINE SULFATE 2 MG/ML IJ SOLN
1.0000 mg | INTRAMUSCULAR | Status: AC | PRN
  Administered 2011-12-21: 2 mg via INTRAVENOUS
  Filled 2011-12-20: qty 1

## 2011-12-20 MED ORDER — SODIUM CHLORIDE 0.9 % IV SOLN: 250.0000 mL | INTRAVENOUS | Status: DC

## 2011-12-20 MED ORDER — OXYCODONE HCL 5 MG PO TABS
5.0000 mg | ORAL_TABLET | ORAL | Status: DC | PRN
  Administered 2011-12-21 – 2011-12-23 (×6): 10 mg via ORAL
  Administered 2011-12-23: 5 mg via ORAL
  Filled 2011-12-20 (×6): qty 2

## 2011-12-20 MED ORDER — MAGNESIUM SULFATE 40 MG/ML IJ SOLN
4.0000 g | Freq: Once | INTRAMUSCULAR | Status: AC
  Administered 2011-12-20: 4 g via INTRAVENOUS
  Filled 2011-12-20: qty 100

## 2011-12-20 MED ORDER — MORPHINE SULFATE 2 MG/ML IJ SOLN
2.0000 mg | INTRAMUSCULAR | Status: DC | PRN
  Administered 2011-12-21 (×4): 2 mg via INTRAVENOUS
  Administered 2011-12-21: 4 mg via INTRAVENOUS
  Filled 2011-12-20 (×2): qty 1
  Filled 2011-12-20: qty 2
  Filled 2011-12-20 (×2): qty 1

## 2011-12-20 MED ORDER — ASPIRIN 81 MG PO CHEW: 324.0000 mg | CHEWABLE_TABLET | Freq: Every day | ORAL | Status: DC

## 2011-12-20 MED ORDER — MIDAZOLAM HCL 5 MG/5ML IJ SOLN
INTRAMUSCULAR | Status: DC | PRN
  Administered 2011-12-20 (×2): 2 mg via INTRAVENOUS
  Administered 2011-12-20: 4 mg via INTRAVENOUS
  Administered 2011-12-20: 2 mg via INTRAVENOUS
  Administered 2011-12-20: 4 mg via INTRAVENOUS

## 2011-12-20 MED ORDER — SODIUM CHLORIDE 0.9 % IJ SOLN: 3.0000 mL | INTRAMUSCULAR | Status: DC | PRN

## 2011-12-20 MED ORDER — INSULIN REGULAR BOLUS VIA INFUSION
0.0000 [IU] | Freq: Three times a day (TID) | INTRAVENOUS | Status: DC
  Filled 2011-12-20: qty 10

## 2011-12-20 MED ORDER — MIDAZOLAM HCL 2 MG/2ML IJ SOLN: 2.0000 mg | INTRAMUSCULAR | Status: DC | PRN

## 2011-12-20 MED ORDER — NITROGLYCERIN IN D5W 200-5 MCG/ML-% IV SOLN: 0.0000 ug/min | INTRAVENOUS | Status: DC

## 2011-12-20 MED ORDER — DEXTROSE 5 % IV SOLN
1.5000 g | Freq: Two times a day (BID) | INTRAVENOUS | Status: DC
  Administered 2011-12-20 – 2011-12-21 (×3): 1.5 g via INTRAVENOUS
  Filled 2011-12-20 (×4): qty 1.5

## 2011-12-20 MED ORDER — ACETAMINOPHEN 160 MG/5ML PO SOLN: 650.0000 mg | ORAL | Status: AC

## 2011-12-20 MED ORDER — ASPIRIN EC 325 MG PO TBEC
325.0000 mg | DELAYED_RELEASE_TABLET | Freq: Every day | ORAL | Status: DC
  Administered 2011-12-21: 325 mg via ORAL
  Filled 2011-12-20 (×2): qty 1

## 2011-12-20 MED ORDER — ALBUMIN HUMAN 5 % IV SOLN
250.0000 mL | INTRAVENOUS | Status: AC | PRN
  Administered 2011-12-20 (×2): 250 mL via INTRAVENOUS
  Filled 2011-12-20: qty 250

## 2011-12-20 MED ORDER — NITROGLYCERIN IN D5W 200-5 MCG/ML-% IV SOLN
INTRAVENOUS | Status: DC | PRN
  Administered 2011-12-20: 5 ug/min via INTRAVENOUS

## 2011-12-20 MED ORDER — SODIUM CHLORIDE 0.9 % IJ SOLN
3.0000 mL | Freq: Two times a day (BID) | INTRAMUSCULAR | Status: DC
  Administered 2011-12-21: 3 mL via INTRAVENOUS

## 2011-12-20 MED ORDER — SODIUM CHLORIDE 0.9 % IV SOLN
1.0000 g | Freq: Once | INTRAVENOUS | Status: AC | PRN
  Filled 2011-12-20: qty 10

## 2011-12-20 MED ORDER — VANCOMYCIN HCL IN DEXTROSE 1-5 GM/200ML-% IV SOLN
1000.0000 mg | Freq: Once | INTRAVENOUS | Status: AC
  Administered 2011-12-20: 1000 mg via INTRAVENOUS
  Filled 2011-12-20: qty 200

## 2011-12-20 MED ORDER — ACETAMINOPHEN 650 MG RE SUPP
650.0000 mg | RECTAL | Status: AC
  Administered 2011-12-20: 650 mg via RECTAL

## 2011-12-20 MED ORDER — VECURONIUM BROMIDE 10 MG IV SOLR
INTRAVENOUS | Status: DC | PRN
  Administered 2011-12-20: 3 mg via INTRAVENOUS
  Administered 2011-12-20: 5 mg via INTRAVENOUS
  Administered 2011-12-20 (×2): 2 mg via INTRAVENOUS
  Administered 2011-12-20: 3 mg via INTRAVENOUS
  Administered 2011-12-20: 2 mg via INTRAVENOUS

## 2011-12-20 MED ORDER — ONDANSETRON HCL 4 MG/2ML IJ SOLN: 4.0000 mg | Freq: Four times a day (QID) | INTRAMUSCULAR | Status: DC | PRN

## 2011-12-20 MED ORDER — ROCURONIUM BROMIDE 100 MG/10ML IV SOLN
INTRAVENOUS | Status: DC | PRN
  Administered 2011-12-20: 30 mg via INTRAVENOUS
  Administered 2011-12-20: 70 mg via INTRAVENOUS

## 2011-12-20 MED ORDER — SODIUM CHLORIDE 0.9 % IV SOLN
0.1000 ug/kg/h | INTRAVENOUS | Status: DC
  Filled 2011-12-20: qty 2

## 2011-12-20 MED ORDER — 0.9 % SODIUM CHLORIDE (POUR BTL) OPTIME
TOPICAL | Status: DC | PRN
  Administered 2011-12-20: 1000 mL
  Administered 2011-12-20: 7000 mL

## 2011-12-20 MED ORDER — ACETAMINOPHEN 160 MG/5ML PO SOLN: 975.0000 mg | Freq: Four times a day (QID) | ORAL | Status: DC

## 2011-12-20 MED ORDER — PANTOPRAZOLE SODIUM 40 MG PO TBEC: 40.0000 mg | DELAYED_RELEASE_TABLET | Freq: Every day | ORAL | Status: DC

## 2011-12-20 MED ORDER — METOPROLOL TARTRATE 1 MG/ML IV SOLN: 2.5000 mg | INTRAVENOUS | Status: DC | PRN

## 2011-12-20 MED ORDER — SODIUM CHLORIDE 0.9 % IV SOLN
INTRAVENOUS | Status: DC
  Administered 2011-12-20: 4.4 [IU]/h via INTRAVENOUS
  Administered 2011-12-20: 3.4 [IU]/h via INTRAVENOUS
  Administered 2011-12-20: 3.8 [IU]/h via INTRAVENOUS
  Administered 2011-12-21: 01:00:00 via INTRAVENOUS
  Administered 2011-12-21: 8.5 [IU]/h via INTRAVENOUS
  Filled 2011-12-20: qty 1

## 2011-12-20 MED ORDER — LACTATED RINGERS IV SOLN
INTRAVENOUS | Status: DC | PRN
  Administered 2011-12-20: 08:00:00 via INTRAVENOUS

## 2011-12-20 MED ORDER — METOPROLOL TARTRATE 12.5 MG HALF TABLET
12.5000 mg | ORAL_TABLET | Freq: Two times a day (BID) | ORAL | Status: DC
  Filled 2011-12-20 (×5): qty 1

## 2011-12-20 SURGICAL SUPPLY — 145 items
ADAPTER CARDIO PERF ANTE/RETRO (ADAPTER) ×3 IMPLANT
ADH SKN CLS APL DERMABOND .7 (GAUZE/BANDAGES/DRESSINGS) ×4
ADPR PRFSN 84XANTGRD RTRGD (ADAPTER) ×2
APPLICATOR COTTON TIP 6IN STRL (MISCELLANEOUS) IMPLANT
ATTRACTOMAT 16X20 MAGNETIC DRP (DRAPES) ×3 IMPLANT
BAG DECANTER FOR FLEXI CONT (MISCELLANEOUS) ×3 IMPLANT
BAG URIMETER BARDEX IC 350 (UROLOGICAL SUPPLIES) ×1 IMPLANT
BANDAGE ELASTIC 4 VELCRO ST LF (GAUZE/BANDAGES/DRESSINGS) ×3 IMPLANT
BANDAGE ELASTIC 6 VELCRO ST LF (GAUZE/BANDAGES/DRESSINGS) ×3 IMPLANT
BANDAGE GAUZE ELAST BULKY 4 IN (GAUZE/BANDAGES/DRESSINGS) ×3 IMPLANT
BLADE STERNUM SYSTEM 6 (BLADE) ×3 IMPLANT
BLADE SURG 15 STRL LF DISP TIS (BLADE) ×2 IMPLANT
BLADE SURG 15 STRL SS (BLADE) ×3
BLADE SURG ROTATE 9660 (MISCELLANEOUS) IMPLANT
BOOT SUTURE AID YELLOW STND (SUTURE) ×3 IMPLANT
CANISTER SUCTION 2500CC (MISCELLANEOUS) ×3 IMPLANT
CANN PRFSN .5XCNCT 15X34-48 (MISCELLANEOUS) ×2
CANNULA AORTIC HI-FLOW 6.5M20F (CANNULA) ×3 IMPLANT
CANNULA GUNDRY RCSP 15FR (MISCELLANEOUS) IMPLANT
CANNULA PRFSN .5XCNCT 15X34-48 (MISCELLANEOUS) ×2 IMPLANT
CANNULA VEN 2 STAGE (MISCELLANEOUS) ×3
CANNULA VENOUS DUAL 32/40 (CANNULA) IMPLANT
CATH COUDE 5CC RIBBED (CATHETERS) IMPLANT
CATH CPB KIT GERHARDT (MISCELLANEOUS) ×3 IMPLANT
CATH HEART VENT LEFT (CATHETERS) ×2 IMPLANT
CATH RETROPLEGIA CORONARY 14FR (CATHETERS) ×3 IMPLANT
CATH RIBBED COUDE 5CC (CATHETERS) ×3
CATH ROBINSON RED A/P 18FR (CATHETERS) ×1 IMPLANT
CATH THORACIC 28FR (CATHETERS) ×3 IMPLANT
CATH THORACIC 36FR (CATHETERS) IMPLANT
CATH THORACIC 36FR RT ANG (CATHETERS) IMPLANT
CATH/SQUID NICHOLS JEHLE COR (CATHETERS) IMPLANT
CLIP RETRACTION 3.0MM CORONARY (MISCELLANEOUS) ×1 IMPLANT
CLIP TI MEDIUM 24 (CLIP) IMPLANT
CLIP TI WIDE RED SMALL 24 (CLIP) IMPLANT
CLOTH BEACON ORANGE TIMEOUT ST (SAFETY) ×3 IMPLANT
CONN ST 1/4X3/8  BEN (MISCELLANEOUS) ×1
CONN ST 1/4X3/8 BEN (MISCELLANEOUS) IMPLANT
CONT SPEC 4OZ CLIKSEAL STRL BL (MISCELLANEOUS) ×1 IMPLANT
COVER SURGICAL LIGHT HANDLE (MISCELLANEOUS) ×6 IMPLANT
CRADLE DONUT ADULT HEAD (MISCELLANEOUS) ×3 IMPLANT
DERMABOND ADVANCED (GAUZE/BANDAGES/DRESSINGS) ×2
DERMABOND ADVANCED .7 DNX12 (GAUZE/BANDAGES/DRESSINGS) IMPLANT
DRAIN CHANNEL 28F RND 3/8 FF (WOUND CARE) ×3 IMPLANT
DRAIN CHANNEL 32F RND 10.7 FF (WOUND CARE) IMPLANT
DRAPE CARDIOVASCULAR INCISE (DRAPES) ×3
DRAPE SLUSH MACHINE 52X66 (DRAPES) IMPLANT
DRAPE SLUSH/WARMER DISC (DRAPES) IMPLANT
DRAPE SRG 135X102X78XABS (DRAPES) ×2 IMPLANT
DRSG COVADERM 4X14 (GAUZE/BANDAGES/DRESSINGS) ×3 IMPLANT
ELECT BLADE 4.0 EZ CLEAN MEGAD (MISCELLANEOUS) ×3
ELECT CAUTERY BLADE 6.4 (BLADE) ×3 IMPLANT
ELECT REM PT RETURN 9FT ADLT (ELECTROSURGICAL) ×6
ELECTRODE BLDE 4.0 EZ CLN MEGD (MISCELLANEOUS) ×2 IMPLANT
ELECTRODE REM PT RTRN 9FT ADLT (ELECTROSURGICAL) ×4 IMPLANT
GLOVE BIO SURGEON STRL SZ 6 (GLOVE) ×3 IMPLANT
GLOVE BIO SURGEON STRL SZ 6.5 (GLOVE) ×14 IMPLANT
GLOVE BIO SURGEON STRL SZ7 (GLOVE) IMPLANT
GLOVE BIO SURGEON STRL SZ7.5 (GLOVE) IMPLANT
GLOVE BIOGEL PI IND STRL 6 (GLOVE) IMPLANT
GLOVE BIOGEL PI IND STRL 6.5 (GLOVE) IMPLANT
GLOVE BIOGEL PI IND STRL 7.0 (GLOVE) IMPLANT
GLOVE BIOGEL PI INDICATOR 6 (GLOVE)
GLOVE BIOGEL PI INDICATOR 6.5 (GLOVE) ×5
GLOVE BIOGEL PI INDICATOR 7.0 (GLOVE) ×5
GLOVE EUDERMIC 7 POWDERFREE (GLOVE) IMPLANT
GLOVE ORTHO TXT STRL SZ7.5 (GLOVE) IMPLANT
GOWN STRL NON-REIN LRG LVL3 (GOWN DISPOSABLE) ×12 IMPLANT
HEMOSTAT POWDER SURGIFOAM 1G (HEMOSTASIS) ×9 IMPLANT
HEMOSTAT SURGICEL 2X14 (HEMOSTASIS) ×3 IMPLANT
INSERT FOGARTY 61MM (MISCELLANEOUS) IMPLANT
INSERT FOGARTY XLG (MISCELLANEOUS) IMPLANT
KIT BASIN OR (CUSTOM PROCEDURE TRAY) ×3 IMPLANT
KIT ROOM TURNOVER OR (KITS) ×3 IMPLANT
KIT SUCTION CATH 14FR (SUCTIONS) ×7 IMPLANT
KIT VASOVIEW W/TROCAR VH 2000 (KITS) ×3 IMPLANT
LEAD PACING MYOCARDI (MISCELLANEOUS) ×3 IMPLANT
MARKER GRAFT CORONARY BYPASS (MISCELLANEOUS) ×9 IMPLANT
NDL 18GX1X1/2 (RX/OR ONLY) (NEEDLE) ×2 IMPLANT
NDL SUT 1 .5 CRC FRENCH EYE (NEEDLE) IMPLANT
NEEDLE 18GX1X1/2 (RX/OR ONLY) (NEEDLE) ×3 IMPLANT
NEEDLE AORTIC AIR ASPIRATING (NEEDLE) ×1 IMPLANT
NEEDLE FRENCH EYE (NEEDLE) ×3
NS IRRIG 1000ML POUR BTL (IV SOLUTION) ×18 IMPLANT
PACK OPEN HEART (CUSTOM PROCEDURE TRAY) ×3 IMPLANT
PAD ARMBOARD 7.5X6 YLW CONV (MISCELLANEOUS) ×6 IMPLANT
PENCIL BUTTON HOLSTER BLD 10FT (ELECTRODE) ×3 IMPLANT
PUNCH AORTIC ROTATE 4.0MM (MISCELLANEOUS) IMPLANT
PUNCH AORTIC ROTATE 4.5MM 8IN (MISCELLANEOUS) ×1 IMPLANT
PUNCH AORTIC ROTATE 5MM 8IN (MISCELLANEOUS) IMPLANT
SOLUTION ANTI FOG 6CC (MISCELLANEOUS) ×1 IMPLANT
SPONGE GAUZE 4X4 12PLY (GAUZE/BANDAGES/DRESSINGS) ×6 IMPLANT
SPONGE LAP 18X18 X RAY DECT (DISPOSABLE) ×3 IMPLANT
SPONGE LAP 4X18 X RAY DECT (DISPOSABLE) IMPLANT
SUT BONE WAX W31G (SUTURE) ×3 IMPLANT
SUT ETHIBON 2 0 V 52N 30 (SUTURE) ×8 IMPLANT
SUT MNCRL AB 4-0 PS2 18 (SUTURE) ×2 IMPLANT
SUT PROLENE 3 0 RB 1 (SUTURE) IMPLANT
SUT PROLENE 3 0 SH 1 (SUTURE) ×3 IMPLANT
SUT PROLENE 3 0 SH DA (SUTURE) IMPLANT
SUT PROLENE 3 0 SH1 36 (SUTURE) ×5 IMPLANT
SUT PROLENE 4 0 RB 1 (SUTURE) ×9
SUT PROLENE 4 0 SH DA (SUTURE) IMPLANT
SUT PROLENE 4 0 TF (SUTURE) ×6 IMPLANT
SUT PROLENE 4-0 RB1 .5 CRCL 36 (SUTURE) ×2 IMPLANT
SUT PROLENE 5 0 C 1 36 (SUTURE) ×2 IMPLANT
SUT PROLENE 6 0 C 1 30 (SUTURE) ×1 IMPLANT
SUT PROLENE 6 0 CC (SUTURE) ×7 IMPLANT
SUT PROLENE 7 0 BV 1 (SUTURE) IMPLANT
SUT PROLENE 7 0 BV1 MDA (SUTURE) ×4 IMPLANT
SUT PROLENE 7.0 RB 3 (SUTURE) IMPLANT
SUT PROLENE 8 0 BV175 6 (SUTURE) ×7 IMPLANT
SUT SILK  1 MH (SUTURE)
SUT SILK 1 MH (SUTURE) IMPLANT
SUT SILK 2 0 SH CR/8 (SUTURE) IMPLANT
SUT SILK 3 0 SH CR/8 (SUTURE) IMPLANT
SUT STEEL 6MS V (SUTURE) ×3 IMPLANT
SUT STEEL STERNAL CCS#1 18IN (SUTURE) IMPLANT
SUT STEEL SZ 6 DBL 3X14 BALL (SUTURE) ×3 IMPLANT
SUT VIC AB 1 CTX 18 (SUTURE) ×6 IMPLANT
SUT VIC AB 1 CTX 36 (SUTURE)
SUT VIC AB 1 CTX36XBRD ANBCTR (SUTURE) IMPLANT
SUT VIC AB 2-0 CT1 27 (SUTURE) ×6
SUT VIC AB 2-0 CT1 TAPERPNT 27 (SUTURE) IMPLANT
SUT VIC AB 2-0 CTX 27 (SUTURE) IMPLANT
SUT VIC AB 3-0 SH 27 (SUTURE)
SUT VIC AB 3-0 SH 27X BRD (SUTURE) IMPLANT
SUT VIC AB 3-0 X1 27 (SUTURE) IMPLANT
SUT VICRYL 4-0 PS2 18IN ABS (SUTURE) IMPLANT
SUTURE E-PAK OPEN HEART (SUTURE) ×6 IMPLANT
SYR 20CC LL (SYRINGE) ×1 IMPLANT
SYR 3ML LL SCALE MARK (SYRINGE) ×1 IMPLANT
SYR TOOMEY 50ML (SYRINGE) ×1 IMPLANT
SYSTEM SAHARA CHEST DRAIN ATS (WOUND CARE) ×3 IMPLANT
TAPE CLOTH SURG 4X10 WHT LF (GAUZE/BANDAGES/DRESSINGS) ×1 IMPLANT
TOWEL OR 17X24 6PK STRL BLUE (TOWEL DISPOSABLE) ×6 IMPLANT
TOWEL OR 17X26 10 PK STRL BLUE (TOWEL DISPOSABLE) ×6 IMPLANT
TRAY FOLEY IC TEMP SENS 14FR (CATHETERS) ×3 IMPLANT
TUBE FEEDING 8FR 16IN STR KANG (MISCELLANEOUS) ×3 IMPLANT
TUBE SUCT INTRACARD DLP 20F (MISCELLANEOUS) ×3 IMPLANT
TUBING INSUFFLATION 10FT LAP (TUBING) ×3 IMPLANT
UNDERPAD 30X30 INCONTINENT (UNDERPADS AND DIAPERS) ×3 IMPLANT
VALVE MAGNA EASE AORTIC 23MM (Prosthesis & Implant Heart) ×1 IMPLANT
VENT LEFT HEART 12002 (CATHETERS) ×3
WATER STERILE IRR 1000ML POUR (IV SOLUTION) ×6 IMPLANT

## 2011-12-20 NOTE — Brief Op Note (Signed)
12/14/2011 - 12/20/2011  2:36 PM  PATIENT:  Curtis Brennan  78 y.o. male  PRE-OPERATIVE DIAGNOSIS:  CORONARY ARTERY DISEASE, AORTIC STENOSIS  POST-OPERATIVE DIAGNOSIS:  CORONARY ARTERY DISEASE, AORTIC STENOSIS  PROCEDURE:  Procedure(s) (LRB):  CORONARY ARTERY BYPASS GRAFTING x3  LIMA to LAD  SVG to PD  SVG to OM  AORTIC VALVE REPLACEMENT   [employer] 25mm Pericardial Tissue Valve  ENDOSCOPIC SAPHENOUS VEIN HARVEST LEFT THIGH  CYSTOSCOPY (N/A)  SURGEON:  Surgeon(s) and Role: Panel 1:    * [name], MD - Primary  Panel 2:    * Antony Haste, MD - Primary  PHYSICIAN ASSISTANT: Lowella Dandy PA-C  ANESTHESIA:   general  EBL:  Total I/O In: -  Out: 900 [Urine:900]  DRAINS: chest tube, chest drains   LOCAL MEDICATIONS USED:  NONE  SPECIMEN:  Source of Specimen:  Aortic Valve Leaflets  DISPOSITION OF SPECIMEN:  PATHOLOGY  COUNTS:  YES  TOURNIQUET:  * No tourniquets in log *  DICTATION: .Dragon Dictation  PLAN OF CARE: Admit to inpatient   PATIENT DISPOSITION:  ICU - intubated and hemodynamically stable.   Delay start of Pharmacological VTE agent (>24hrs) due to surgical blood loss or risk of bleeding: no

## 2011-12-20 NOTE — Anesthesia Procedure Notes (Signed)
Procedures Procedures: Right IJ Curtis Brennan Catheter Insertion:[id number]: The patient was identified and consent obtained.  TO was performed, and full barrier precautions were used.  The skin was anesthetized with lidocaine-4cc plain with 25g needle.  Once the vein was located with the 22 ga. needle using ultrasound guidance , the wire was inserted into the vein.  The wire location was confirmed with ultrasound.  The tissue was dilated and the 8.5 [place] cordis catheter was carefully inserted. Afterwards Curtis Brennan catheter was inserted. PA catheter at 45cm.  The patient tolerated the procedure well.   CE

## 2011-12-20 NOTE — OR Nursing (Signed)
Attempted to insert Foley catheter and met resistance. Dr. Tyrone Sage notified. Paged urologist on call.

## 2011-12-20 NOTE — Preoperative (Signed)
Beta Blockers   Reason not to administer Beta Blockers:Not Applicable 

## 2011-12-20 NOTE — Progress Notes (Signed)
[facility] BRIEF SICU PROGRESS NOTE  Day of Surgery  S/P Procedure(s) (LRB): CORONARY ARTERY BYPASS GRAFTING (CABG) (N/A) AORTIC VALVE REPLACEMENT (AVR) (N/A) CYSTOSCOPY (N/A)   Sedated on vent AAI paced, BP stable, PA pressures low, C.I. 1.6 Chest tube output low UOP excellent Labs okay with Hgb 11.9  Plan: Continue routine postop.  Needs volume.  Purcell Nails 12/20/2011 7:37 PM

## 2011-12-20 NOTE — OR Nursing (Signed)
First timeout performed with Dr. Mena Goes at [phone] prior to the start of the Cystoscopy procedure at [phone]. Cystoscopy end time 1005. Second timeout performed with Dr. Tyrone Sage and staff at 1024 prior to start of second procedure CABG and AVR at 1025.

## 2011-12-20 NOTE — OR Nursing (Signed)
Made call to volunteer desk to inform patient's family that the patient is off pump at 1552. Made first call to 2300 SICU at 1553. Made second call to 2300 SICU at 1614.

## 2011-12-20 NOTE — Progress Notes (Signed)
  Echocardiogram Echocardiogram Transesophageal has been performed.  Dorena Cookey 12/20/2011, 11:37 AM

## 2011-12-20 NOTE — Anesthesia Preprocedure Evaluation (Addendum)
Anesthesia Evaluation  Patient identified by MRN, date of birth, ID band Patient awake    History of Anesthesia Complications (+) PONV  Airway Mallampati: II      Dental   Pulmonary shortness of breath and with exertion, pneumonia , COPD breath sounds clear to auscultation        Cardiovascular hypertension, Pt. on medications + angina with exertion + CAD + Valvular Problems/Murmurs Rhythm:Regular Rate:Normal + Systolic murmurs    Neuro/Psych    GI/Hepatic Neg liver ROS, GERD-  ,  Endo/Other  negative endocrine ROS  Renal/GU negative Renal ROS     Musculoskeletal   Abdominal   Peds  Hematology   Anesthesia Other Findings   Reproductive/Obstetrics                          Anesthesia Physical Anesthesia Plan  ASA: IV  Anesthesia Plan: General   Post-op Pain Management:    Induction: Intravenous  Airway Management Planned: Oral ETT  Additional Equipment: Arterial line, PA Cath, TEE and Ultrasound Guidance Line Placement  Intra-op Plan:   Post-operative Plan:   Informed Consent: I have reviewed the patients History and Physical, chart, labs and discussed the procedure including the risks, benefits and alternatives for the proposed anesthesia with the patient or authorized representative who has indicated his/her understanding and acceptance.     Plan Discussed with: CRNA  Anesthesia Plan Comments:        Anesthesia Quick Evaluation

## 2011-12-20 NOTE — Transfer of Care (Signed)
Immediate Anesthesia Transfer of Care Note  Patient: Curtis Brennan  Procedure(s) Performed: Procedure(s) (LRB): CORONARY ARTERY BYPASS GRAFTING (CABG) (N/A) AORTIC VALVE REPLACEMENT (AVR) (N/A) CYSTOSCOPY (N/A)  Patient Location: PACU and SICU  Anesthesia Type: General  Level of Consciousness: Patient remains intubated per anesthesia plan  Airway & Oxygen Therapy: Patient remains intubated per anesthesia plan and Patient placed on Ventilator (see vital sign flow sheet for setting)  Post-op Assessment: Report given to PACU RN and Post -op Vital signs reviewed and stable  Post vital signs: Reviewed and stable  Complications: No apparent anesthesia complications

## 2011-12-20 NOTE — Consult Note (Signed)
Consult requested by Dr. Tyrone Sage Diagnosis: bladder neck contracture (difficult foley)  History of Present Illness: Curtis Brennan is a 78 yo who is undergoing CABG/AVR today. After he was put to sleep in the OR a foley catheter was attempted but could not be placed. Per the nurse a coude was tried but no successful. Per Dr. Tyrone Sage an 8Fr red rubber catheter was inserted and drained urine then removed. Curtis Brennan has a past history of open radical retropubic prostatectomy and post-op radiation therapy. Last PSA in 2011 was undetectable.    I spoke with the patient's wife as he was under general anesthesia in OR 12. She reports the patient has been voiding with a slow stream and intermittent flow. I discussed with her the situation and the nature, risks, benefits and alternatives to cystoscopy, dilation of urethral stricture/bladder neck and foley placement. We discussed post-dilation incontinence as well as scar recurrence among other risks. All questions answered and she consented for her husband for me to proceed.   Past Medical History  Diagnosis Date  . Prostate cancer   . Complication of anesthesia 07/1997    "just wouldn't wake wakeup"  . PONV (postoperative nausea and vomiting)   . Coronary artery disease   . Hypertension   . Heart murmur   . Angina   . Pulmonary embolism ~ 2011    "both lungs"  . Legionnaire's disease 1986  . Pneumonia     "once"  . Exertional dyspnea   . Kidney stones   . Unstable angina 12/15/2011  . CAD (coronary artery disease) with history of LAD & Diag athrectomy in 1994, now with severe 3 vesseal CAD by cath 12/14/11 for CABG 12/15/2011  . AS (aortic stenosis) moderate to severe by cath 12/14/11 for AVR  12/15/2011  . Chronic anticoagulation, on coumadin for pul emboli 12/15/2011  . Carotid artery disease, bilateral 12/15/2011   Past Surgical History  Procedure Date  . Coronary angioplasty 1994    "roto rooter"  . Cardiac catheterization 12/14/11  .  Cholecystectomy 2001  . Prostate surgery 07/1997  . Lithotripsy 2006  . Gallstone removed ~ 2010    "was lodged below pancreatis"    Home Medications:  Prescriptions prior to admission  Medication Sig Dispense Refill  . amLODipine (NORVASC) 5 MG tablet Take 5 mg by mouth 2 (two) times daily.      [name] aspirin 81 MG tablet Take 81 mg by mouth daily.      [name] lisinopril (PRINIVIL,ZESTRIL) 10 MG tablet Take 10 mg by mouth daily.      . metoprolol tartrate (LOPRESSOR) 25 MG tablet Take 25 mg by mouth 2 (two) times daily.      [name] warfarin (COUMADIN) 10 MG tablet Take 10 mg by mouth daily.       Allergies:  Allergies  Allergen Reactions  . Sulfonamide Derivatives Rash    "I don't even remember having problem; dr says I did"    History reviewed. No pertinent family history. Social History:  reports that he has never smoked. He has never used smokeless tobacco. He reports that he does not drink alcohol or use illicit drugs.  ROS: A complete review of systems was reviewed in the chart.   Physical Exam:  Vital signs in last 24 hours: Temp:  [97.9 F (36.6 C)-98.3 F (36.8 C)] 98 F (36.7 C) (05/29 0422) Pulse Rate:  [80-93] 84  (05/29 0422) Resp:  [18] 18  (05/29 0422) BP: (113-164)/(67-82) 132/80 mmHg (05/29 0432)  SpO2:  [94 %-99 %] 99 % (05/29 0422) Weight:  [93.441 kg (206 lb)] 93.441 kg (206 lb) (05/29 0422) General: Under general anesthesia in OR 12 HEENT: Intubated Cardiovascular: Regular rate and rhythm Lungs:regular rate on vent Abdomen: Soft, nontender, nondistended, no abdominal masses, ML scar from RRP Back: No CVA tenderness Genitourinary:  Normal male phallus, testes are descended bilaterally and non-tender and without masses, scrotum is normal in appearance without lesions or masses Extremities: No edema Neurologic: not attainable  Procedure: patient external genitalia prepped and draped. Flex cystourethroscopy was performed. The bladder neck was narrow and estimated at  8Fr. The scope would not pass in bladder. A sensor wire was passed into the bladder and the scope removed. The bladder neck was dilated from 12-24 Fr and noted to have a stiff, scarred bladder neck. Clear urine returned each pass. An 18Fr council was then passed over the wire into the bladder draining clear urine. The wire was removed, balloon inflated and seated at the bladder neck. The foley drained well and was mobile. No complications.    Laboratory Data:  Results for orders placed during the hospital encounter of 12/14/11 (from the past 24 hour(s))  HEPARIN LEVEL (UNFRACTIONATED)     Status: Normal   Collection Time   12/19/11 12:50 PM      Component Value Range   Heparin Unfractionated 0.52  0.30 - 0.70 (IU/mL)  SURGICAL PCR SCREEN     Status: Normal   Collection Time   12/19/11  9:16 PM      Component Value Range   MRSA, PCR NEGATIVE  NEGATIVE    Staphylococcus aureus NEGATIVE  NEGATIVE   URINALYSIS, ROUTINE W REFLEX MICROSCOPIC     Status: Abnormal   Collection Time   12/19/11 11:42 PM      Component Value Range   Color, Urine YELLOW  YELLOW    APPearance CLEAR  CLEAR    Specific Gravity, Urine 1.021  1.005 - 1.030    pH 6.0  5.0 - 8.0    Glucose, UA 100 (*) NEGATIVE (mg/dL)   Hgb urine dipstick NEGATIVE  NEGATIVE    Bilirubin Urine NEGATIVE  NEGATIVE    Ketones, ur NEGATIVE  NEGATIVE (mg/dL)   Protein, ur NEGATIVE  NEGATIVE (mg/dL)   Urobilinogen, UA 0.2  0.0 - 1.0 (mg/dL)   Nitrite NEGATIVE  NEGATIVE    Leukocytes, UA NEGATIVE  NEGATIVE   CBC     Status: Abnormal   Collection Time   12/20/11  2:27 AM      Component Value Range   WBC 4.8  4.0 - 10.5 (K/uL)   RBC 4.35  4.22 - 5.81 (MIL/uL)   Hemoglobin 14.0  13.0 - 17.0 (g/dL)   HCT 45.4  09.8 - 11.9 (%)   MCV 92.9  78.0 - 100.0 (fL)   MCH 32.2  26.0 - 34.0 (pg)   MCHC 34.7  30.0 - 36.0 (g/dL)   RDW 14.7  82.9 - 56.2 (%)   Platelets 147 (*) 150 - 400 (K/uL)  BASIC METABOLIC PANEL     Status: Abnormal   Collection Time    12/20/11  2:27 AM      Component Value Range   Sodium 138  135 - 145 (mEq/L)   Potassium 4.3  3.5 - 5.1 (mEq/L)   Chloride 103  96 - 112 (mEq/L)   CO2 27  19 - 32 (mEq/L)   Glucose, Bld 109 (*) 70 - 99 (mg/dL)   BUN 16  6 - 23 (mg/dL)   Creatinine, Ser 1.61  0.50 - 1.35 (mg/dL)   Calcium 9.3  8.4 - 09.6 (mg/dL)   GFR calc non Af Amer 70 (*) >90 (mL/min)   GFR calc Af Amer 82 (*) >90 (mL/min)   Recent Results (from the past 240 hour(s))  SURGICAL PCR SCREEN     Status: Normal   Collection Time   12/19/11  9:16 PM      Component Value Range Status Comment   MRSA, PCR NEGATIVE  NEGATIVE  Final    Staphylococcus aureus NEGATIVE  NEGATIVE  Final    Creatinine:  Basename 12/20/11 0227 12/16/11 0534  CREATININE 1.00 0.85    Impression/Assessment:  Bladder neck contracture  Plan:  -May remove foley once pt is ambulatory post-op. F/u with Dr. Patsi Sears at [facility] after discharge for routine follow-up. Discussed post-op care, f/u instructions with patient's wife.  Curtis Brennan 12/20/2011, 10:18 AM

## 2011-12-21 ENCOUNTER — Encounter (HOSPITAL_COMMUNITY): Payer: Self-pay | Admitting: Cardiothoracic Surgery

## 2011-12-21 ENCOUNTER — Inpatient Hospital Stay (HOSPITAL_COMMUNITY): Payer: Medicare Other

## 2011-12-21 LAB — CBC
HCT: 33.2 % — ABNORMAL LOW (ref 39.0–52.0)
Hemoglobin: 11.7 g/dL — ABNORMAL LOW (ref 13.0–17.0)
MCH: 32.1 pg (ref 26.0–34.0)
MCH: 32.4 pg (ref 26.0–34.0)
MCHC: 35.2 g/dL (ref 30.0–36.0)
MCV: 92 fL (ref 78.0–100.0)
Platelets: 90 10*3/uL — ABNORMAL LOW (ref 150–400)
RBC: 3.49 MIL/uL — ABNORMAL LOW (ref 4.22–5.81)
RBC: 3.61 MIL/uL — ABNORMAL LOW (ref 4.22–5.81)

## 2011-12-21 LAB — GLUCOSE, CAPILLARY
Glucose-Capillary: 117 mg/dL — ABNORMAL HIGH (ref 70–99)
Glucose-Capillary: 113 mg/dL — ABNORMAL HIGH (ref 70–99)
Glucose-Capillary: 123 mg/dL — ABNORMAL HIGH (ref 70–99)
Glucose-Capillary: 170 mg/dL — ABNORMAL HIGH (ref 70–99)
Glucose-Capillary: 149 mg/dL — ABNORMAL HIGH (ref 70–99)
Glucose-Capillary: 131 mg/dL — ABNORMAL HIGH (ref 70–99)
Glucose-Capillary: 104 mg/dL — ABNORMAL HIGH (ref 70–99)
Glucose-Capillary: 114 mg/dL — ABNORMAL HIGH (ref 70–99)
Glucose-Capillary: 113 mg/dL — ABNORMAL HIGH (ref 70–99)
Glucose-Capillary: 105 mg/dL — ABNORMAL HIGH (ref 70–99)
Glucose-Capillary: 158 mg/dL — ABNORMAL HIGH (ref 70–99)

## 2011-12-21 LAB — BASIC METABOLIC PANEL
CO2: 22 mEq/L (ref 19–32)
Calcium: 7.8 mg/dL — ABNORMAL LOW (ref 8.4–10.5)
GFR calc non Af Amer: 80 mL/min — ABNORMAL LOW (ref >90)
Potassium: 4.1 mEq/L (ref 3.5–5.1)
Sodium: 135 mEq/L (ref 135–145)

## 2011-12-21 LAB — POCT I-STAT 3, ART BLOOD GAS (G3+)
Acid-base deficit: 3 mmol/L — ABNORMAL HIGH (ref 0.0–2.0)
Bicarbonate: 20.2 mEq/L (ref 20.0–24.0)
Bicarbonate: 22.2 mEq/L (ref 20.0–24.0)
O2 Saturation: 93 %
TCO2: 21 mmol/L (ref 0–100)
pCO2 arterial: 34 mmHg — ABNORMAL LOW (ref 35.0–45.0)
pCO2 arterial: 38.9 mmHg (ref 35.0–45.0)
pH, Arterial: 7.382 (ref 7.350–7.450)
pO2, Arterial: 69 mmHg — ABNORMAL LOW (ref 80.0–100.0)

## 2011-12-21 LAB — POCT I-STAT, CHEM 8
BUN: 19 mg/dL (ref 6–23)
HCT: 33 % — ABNORMAL LOW (ref 39.0–52.0)
Sodium: 136 mEq/L (ref 135–145)
TCO2: 21 mmol/L (ref 0–100)

## 2011-12-21 LAB — CREATININE, SERUM: Creatinine, Ser: 0.89 mg/dL (ref 0.50–1.35)

## 2011-12-21 LAB — MAGNESIUM: Magnesium: 2.4 mg/dL (ref 1.5–2.5)

## 2011-12-21 MED ORDER — INSULIN GLARGINE 100 UNIT/ML ~~LOC~~ SOLN
20.0000 [IU] | SUBCUTANEOUS | Status: DC
  Administered 2011-12-21 – 2011-12-23 (×3): 20 [IU] via SUBCUTANEOUS

## 2011-12-21 MED ORDER — TRAMADOL HCL 50 MG PO TABS
50.0000 mg | ORAL_TABLET | Freq: Four times a day (QID) | ORAL | Status: DC | PRN
  Administered 2011-12-21 – 2011-12-22 (×2): 50 mg via ORAL
  Filled 2011-12-21 (×2): qty 1

## 2011-12-21 MED ORDER — INSULIN ASPART 100 UNIT/ML ~~LOC~~ SOLN
0.0000 [IU] | SUBCUTANEOUS | Status: DC
  Administered 2011-12-21 – 2011-12-22 (×6): 2 [IU] via SUBCUTANEOUS

## 2011-12-21 MED FILL — Potassium Chloride Inj 2 mEq/ML: INTRAVENOUS | Qty: 40 | Status: AC

## 2011-12-21 MED FILL — Magnesium Sulfate Inj 50%: INTRAMUSCULAR | Qty: 10 | Status: AC

## 2011-12-21 NOTE — Progress Notes (Signed)
Patient examined and record reviewed.Hemodynamics stable,labs satisfactory.Patient had stable day.Continue current care.O2 .95,  NSR  BP 120/70,weaning neo now @ 15 mcg Pm labs Hct 33,K 4.2 Curtis Brennan,Curtis Brennan 12/21/2011

## 2011-12-21 NOTE — Anesthesia Postprocedure Evaluation (Signed)
  Anesthesia Post-op Note  Patient: Adin Laker  Procedure(s) Performed: Procedure(s) (LRB): CORONARY ARTERY BYPASS GRAFTING (CABG) (N/A) AORTIC VALVE REPLACEMENT (AVR) (N/A) CYSTOSCOPY (N/A)  Patient Location: 2302 SICU  Anesthesia Type: General  Level of Consciousness: awake  Airway and Oxygen Therapy: Patient Spontanous Breathing  Post-op Pain: mild  Post-op Assessment: Post-op Vital signs reviewed, Patient's Cardiovascular Status Stable, Respiratory Function Stable, No signs of Nausea or vomiting and Pain level controlled  Post-op Vital Signs: Reviewed and stable  Complications: No apparent anesthesia complications

## 2011-12-21 NOTE — Progress Notes (Signed)
Patient ID: Curtis Brennan, male   DOB: 06-29-1932, 78 y.o.   MRN: [id number] TCTS DAILY PROGRESS NOTE                   [address]            [place] [address]          [phone]      1 Day Post-Op Procedure(s) (LRB): CORONARY ARTERY BYPASS GRAFTING (CABG) (N/A) AORTIC VALVE REPLACEMENT (AVR) (N/A) CYSTOSCOPY (N/A)  Total Length of Stay:  LOS: 7 days   Subjective: Extubated neuro intact , complaint of chest soreness   Objective: Vital signs in last 24 hours: Temp:  [96.6 F (35.9 C)-99 F (37.2 C)] 97.9 F (36.6 C) (05/30 0700) Pulse Rate:  [89-90] 90  (05/30 0700) Cardiac Rhythm:  [-] Atrial paced (05/30 0600) Resp:  [12-32] 25  (05/30 0700) BP: (92-117)/(47-62) 94/50 mmHg (05/30 0700) SpO2:  [94 %-100 %] 96 % (05/30 0700) Arterial Line BP: (89-127)/(41-60) 91/42 mmHg (05/30 0700) FiO2 (%):  [39.8 %-50.9 %] 42 % (05/30 0030) Weight:  [216 lb 12.8 oz (98.34 kg)] 216 lb 12.8 oz (98.34 kg) (05/30 0500)  Filed Weights   12/16/11 0400 12/20/11 0422 12/21/11 0500  Weight: 205 lb 0.4 oz (93 kg) 206 lb (93.441 kg) 216 lb 12.8 oz (98.34 kg)    Weight change: 10 lb 12.8 oz (4.899 kg)   Hemodynamic parameters for last 24 hours: PAP: (26-45)/(14-26) 31/17 mmHg CO:  [3.2 L/min-5.3 L/min] 5.3 L/min CI:  [1.5 L/min/m2-2.5 L/min/m2] 2.5 L/min/m2  Intake/Output from previous day: 05/29 0701 - 05/30 0700 In: 3010.7 [I.V.:1295.7; Blood:595; NG/GT:30; IV Piggyback:1090] Out: 3540 [Urine:3060; Chest Tube:480]  Intake/Output this shift:    Current Meds: Scheduled Meds:   . acetaminophen (TYLENOL) oral liquid 160 mg/5 mL  650 mg Per Tube NOW   Or  . acetaminophen  650 mg Rectal NOW  . acetaminophen  1,000 mg Oral Q6H   Or  . acetaminophen (TYLENOL) oral liquid 160 mg/5 mL  975 mg Per Tube Q6H  . aspirin EC  325 mg Oral Daily   Or  . aspirin  324 mg Per Tube Daily  . atorvastatin  40 mg Oral q1800  . bisacodyl  10 mg Oral Daily   Or  . bisacodyl  10  mg Rectal Daily  . cefUROXime (ZINACEF)  IV  1.5 g Intravenous To OR  . cefUROXime (ZINACEF)  IV  1.5 g Intravenous Q12H  . dexmedetomidine (PRECEDEX) IV infusion for high rates  0.1-0.7 mcg/kg/hr Intravenous To OR  . docusate sodium  200 mg Oral Daily  . famotidine (PEPCID) IV  20 mg Intravenous Q12H  . insulin (NOVOLIN-R) infusion   Intravenous To OR  . insulin regular  0-10 Units Intravenous TID WC  . magnesium sulfate  4 g Intravenous Once  . metoprolol tartrate  12.5 mg Oral BID   Or  . metoprolol tartrate  12.5 mg Per Tube BID  . nitroglycerin-nicardipine-HEPARIN-sodium bicarbonate irrigation for artery spasm   Irrigation To OR  . pantoprazole  40 mg Oral Q1200  . phenylephrine (NEO-SYNEPHRINE) Adult infusion  30-200 mcg/min Intravenous To OR  . potassium chloride  10 mEq Intravenous Q1 Hr x 3  . sodium chloride  3 mL Intravenous Q12H  . tranexamic acid  15 mg/kg Intravenous To OR  . tranexamic acid (CYKLOKAPRON) infusion ([facility])  1.5 mg/kg/hr Intravenous To OR  . vancomycin  1,500 mg Intravenous To OR  .  vancomycin  1,000 mg Intravenous Once   Continuous Infusions:   . sodium chloride 20 mL (12/20/11 1730)  . sodium chloride 20 mL (12/20/11 1700)  . sodium chloride    . dexmedetomidine (PRECEDEX) IV infusion Stopped (12/20/11 2030)  . insulin (NOVOLIN-R) infusion 5.8 Units/hr (12/21/11 0800)  . lactated ringers    . nitroGLYCERIN    . phenylephrine (NEO-SYNEPHRINE) Adult infusion 15 mcg/min (12/21/11 0600)  . DISCONTD: heparin 1,050 Units/hr (12/19/11 0710)   PRN Meds:.albumin human, calcium chloride  IVPB, lactated ringers, metoprolol, midazolam, morphine injection, morphine injection, ondansetron (ZOFRAN) IV, oxyCODONE, sodium chloride, DISCONTD: 0.9 % irrigation (POUR BTL), DISCONTD: acetaminophen, DISCONTD: hemostatic agents, DISCONTD:  morphine injection, DISCONTD: ondansetron (ZOFRAN) IV, DISCONTD: Surgifoam 1 Gm with 0.9% sodium chloride (4 ml) topical  solution  General appearance: alert, cooperative and mild distress Neurologic: intact Heart: regular rate and rhythm, S1, S2 normal, no murmur, click, rub or gallop and normal apical impulse Lungs: clear to auscultation bilaterally Abdomen: soft, non-tender; bowel sounds normal; no masses,  no organomegaly Extremities: extremities normal, atraumatic, no cyanosis or edema and Homans sign is negative, no sign of DVT sternum stable  Lab Results: CBC: Basename 12/21/11 0400 12/20/11 1725  WBC 9.4 11.9*  HGB 11.2* 11.9*  HCT 31.8* 33.6*  PLT 90* 91*   BMET:  Basename 12/21/11 0400 12/20/11 1718 12/20/11 0227  NA 135 138 --  K 4.1 4.3 --  CL 103 -- 103  CO2 22 -- 27  GLUCOSE 129* 134* --  BUN 15 -- 16  CREATININE 0.90 -- 1.00  CALCIUM 7.8* -- 9.3    PT/INR:  Basename 12/20/11 1725  LABPROT 19.1*  INR 1.57*   Radiology: Dg Chest 2 View  12/20/2011  *RADIOLOGY REPORT*  Clinical Data: Shortness of breath.  Preop CABG.  CHEST - 2 VIEW  Comparison: 12/11/2011  Findings: Normal heart size and pulmonary vascularity.  Mild emphysema with diffuse fibrosis and scattered calcified granulomas in the lungs.  No focal airspace consolidation. Mild blunting of the right costophrenic angle is chronic and suggest pleural thickening.  No pneumothorax.  Degenerative changes in the spine. Calcification of the aorta.  Stable appearance since previous study.  IMPRESSION: Emphysematous changes, fibrosis, and calcified granulomas.  No evidence of active consolidation.  Original Report Authenticated By: Marlon Pel, M.D.   Dg Chest Portable 1 View  12/20/2011  *RADIOLOGY REPORT*  Clinical Data: Status post CABG.  PORTABLE CHEST - 1 VIEW  Comparison: Two-view chest 12/19/2011.  Findings: The patient is intubated. The endotracheal tube terminates 3.3 cm above the carina.  An NG tube courses off the inferior border of the film.  A Swan-Ganz catheter terminates in the main pulmonary outflow tract.  It  enters via a right IJ sheath. A left-sided chest tube and mediastinal drain are in place.  There is no pneumothorax.  Bibasilar airspace disease likely reflects atelectasis.  The patient is status post median sternotomy for CABG and aortic valve replacement.  IMPRESSION:  1.  Postsurgical changes as described. 2.  The support apparatus is in satisfactory position. 3.  Cardiomegaly with bibasilar airspace disease, likely reflecting atelectasis.  Original Report Authenticated By: Jamesetta Orleans. MATTERN, M.D.     Assessment/Plan: S/P Procedure(s) (LRB): CORONARY ARTERY BYPASS GRAFTING (CABG) (N/A) AORTIC VALVE REPLACEMENT (AVR) (N/A) CYSTOSCOPY (N/A) Mobilize Diuresis Diabetes control d/c tubes/lines Continue foley due to bladder outlet obstruction, patient in ICU, urinary output monitoring and required placement by urology in OR preop See progression orders  Expected  acute blood loss anemia, has not required blood transfusion   [name] MD  Beeper [phone] Office [phone] 12/21/2011 8:24 AM

## 2011-12-21 NOTE — Procedures (Signed)
Extubation Procedure Note  Patient Details:   Name: Curtis Brennan DOB: March 25, 1932 MRN: [id number]   Airway Documentation:     Evaluation  O2 sats: stable throughout Complications: No apparent complications Patient did tolerate procedure well. Bilateral Breath Sounds: Clear   Yes  Filbert Schilder 12/21/2011, 12:33 AM  Patient was weaned, performed SBT, coached on deep breathing, cough, and was extubated to a 4 L nasal cannula.  No evidence of stridor present.

## 2011-12-21 NOTE — Progress Notes (Signed)
Subjective: Stable post op  Objective: Vital signs in last 24 hours: Temp:  [96.6 F (35.9 C)-99 F (37.2 C)] 97.5 F (36.4 C) (05/30 0815) Pulse Rate:  [89-90] 90  (05/30 0815) Resp:  [12-32] 30  (05/30 0815) BP: (92-117)/(47-62) 104/51 mmHg (05/30 0800) SpO2:  [94 %-100 %] 94 % (05/30 0815) Arterial Line BP: (89-127)/(41-60) 103/45 mmHg (05/30 0815) FiO2 (%):  [39.8 %-50.9 %] 42 % (05/30 0030) Weight:  [98.34 kg (216 lb 12.8 oz)] 98.34 kg (216 lb 12.8 oz) (05/30 0500) Weight change: 4.899 kg (10 lb 12.8 oz) Last BM Date: 12/19/11 Intake/Output from previous day: -555.2 05/29 0701 - 05/30 0700 In: 3010.7 [I.V.:1295.7; Blood:595; NG/GT:30; IV Piggyback:1090] Out: 3540 [Urine:3060; Chest Tube:480] Intake/Output this shift: Total I/O In: 45.6 [I.V.:45.6] Out: 20 [Urine:20]  PE: General:alert and oriented Heart:S1S2 RRR Lungs:clear Abd:+ BS Ext:+ edema Neuro:alert and oriented   Lab Results:  Basename 12/21/11 0400 12/20/11 1725  WBC 9.4 11.9*  HGB 11.2* 11.9*  HCT 31.8* 33.6*  PLT 90* 91*   BMET  Basename 12/21/11 0400 12/20/11 1718 12/20/11 0227  NA 135 138 --  K 4.1 4.3 --  CL 103 -- 103  CO2 22 -- 27  GLUCOSE 129* 134* --  BUN 15 -- 16  CREATININE 0.90 -- 1.00  CALCIUM 7.8* -- 9.3   No results found for this basename: TROPONINI:2,CK,MB:2 in the last 72 hours  Lab Results  Component Value Date   CHOL  Value: 138        ATP III CLASSIFICATION:  <200     mg/dL   Desirable  147-829  mg/dL   Borderline High  >=562    mg/dL   High        07/27/863   HDL 38* 08/29/2008   LDLCALC  Value: 88        Total Cholesterol/HDL:CHD Risk Coronary Heart Disease Risk Table                     Men   Women  1/2 Average Risk   3.4   3.3  Average Risk       5.0   4.4  2 X Average Risk   9.6   7.1  3 X Average Risk  23.4   11.0        Use the calculated Patient Ratio above and the CHD Risk Table to determine the patient's CHD Risk.        ATP III CLASSIFICATION (LDL):  <100     mg/dL    Optimal  784-696  mg/dL   Near or Above                    Optimal  130-159  mg/dL   Borderline  295-284  mg/dL   High  >132     mg/dL   Very High 10/25/100   TRIG 60 08/29/2008   CHOLHDL 3.6 08/29/2008   Lab Results  Component Value Date   HGBA1C 6.2* 12/20/2011     No results found for this basename: TSH    Hepatic Function Panel No results found for this basename: PROT,ALBUMIN,AST,ALT,ALKPHOS,BILITOT,BILIDIR,IBILI in the last 72 hours No results found for this basename: CHOL in the last 72 hours No results found for this basename: PROTIME in the last 72 hours    EKG: Orders placed during the hospital encounter of 12/14/11  . EKG 12-LEAD  . EKG 12-LEAD  . EKG 12-LEAD  . EKG 12-LEAD  .  EKG 12-LEAD  . EKG 12-LEAD    Studies/Results: Dg Chest 2 View  12/20/2011  *RADIOLOGY REPORT*  Clinical Data: Shortness of breath.  Preop CABG.  CHEST - 2 VIEW  Comparison: 12/11/2011  Findings: Normal heart size and pulmonary vascularity.  Mild emphysema with diffuse fibrosis and scattered calcified granulomas in the lungs.  No focal airspace consolidation. Mild blunting of the right costophrenic angle is chronic and suggest pleural thickening.  No pneumothorax.  Degenerative changes in the spine. Calcification of the aorta.  Stable appearance since previous study.  IMPRESSION: Emphysematous changes, fibrosis, and calcified granulomas.  No evidence of active consolidation.  Original Report Authenticated By: Marlon Pel, M.D.   Dg Chest Portable 1 View In Am  12/21/2011  *RADIOLOGY REPORT*  Clinical Data: Postop.  PORTABLE CHEST - 1 VIEW  Comparison: 12/20/2011.  Findings: Endotracheal tube has been removed, as has the nasogastric tube.  Right IJ Swan-Ganz catheter tip projects over the proximal right pulmonary artery.  Mediastinal drain, left chest tube and epicardial pacer wires remain in place.  Cardiomediastinal silhouette is prominent, as before, and is presumably postoperative in etiology.   Lungs are low in volume with streaky densities at both lung bases.  There may be a small left pleural effusion.  No definite pneumothorax.  IMPRESSION:  1.  Low lung volumes with bibasilar atelectasis. 2.  No definite pneumothorax with left chest tube in place. 3.  Probable small left pleural effusion.  Original Report Authenticated By: Reyes Ivan, M.D.   Dg Chest Portable 1 View  12/20/2011  *RADIOLOGY REPORT*  Clinical Data: Status post CABG.  PORTABLE CHEST - 1 VIEW  Comparison: Two-view chest 12/19/2011.  Findings: The patient is intubated. The endotracheal tube terminates 3.3 cm above the carina.  An NG tube courses off the inferior border of the film.  A Swan-Ganz catheter terminates in the main pulmonary outflow tract.  It enters via a right IJ sheath. A left-sided chest tube and mediastinal drain are in place.  There is no pneumothorax.  Bibasilar airspace disease likely reflects atelectasis.  The patient is status post median sternotomy for CABG and aortic valve replacement.  IMPRESSION:  1.  Postsurgical changes as described. 2.  The support apparatus is in satisfactory position. 3.  Cardiomegaly with bibasilar airspace disease, likely reflecting atelectasis.  Original Report Authenticated By: Jamesetta Orleans. MATTERN, M.D.    Medications: I have reviewed the patient's current medications.    [name] acetaminophen (TYLENOL) oral liquid 160 mg/5 mL  650 mg Per Tube NOW   Or  . acetaminophen  650 mg Rectal NOW  . acetaminophen  1,000 mg Oral Q6H   Or  . acetaminophen (TYLENOL) oral liquid 160 mg/5 mL  975 mg Per Tube Q6H  . aspirin EC  325 mg Oral Daily   Or  . aspirin  324 mg Per Tube Daily  . atorvastatin  40 mg Oral q1800  . bisacodyl  10 mg Oral Daily   Or  . bisacodyl  10 mg Rectal Daily  . cefUROXime (ZINACEF)  IV  1.5 g Intravenous To OR  . cefUROXime (ZINACEF)  IV  1.5 g Intravenous Q12H  . docusate sodium  200 mg Oral Daily  . famotidine (PEPCID) IV  20 mg Intravenous Q12H    . insulin aspart  0-24 Units Subcutaneous Q4H  . insulin glargine  20 Units Subcutaneous BH-q7a  . insulin (NOVOLIN-R) infusion   Intravenous To OR  . magnesium sulfate  4 g  Intravenous Once  . metoprolol tartrate  12.5 mg Oral BID   Or  . metoprolol tartrate  12.5 mg Per Tube BID  . nitroglycerin-nicardipine-HEPARIN-sodium bicarbonate irrigation for artery spasm   Irrigation To OR  . pantoprazole  40 mg Oral Q1200  . phenylephrine (NEO-SYNEPHRINE) Adult infusion  30-200 mcg/min Intravenous To OR  . potassium chloride  10 mEq Intravenous Q1 Hr x 3  . sodium chloride  3 mL Intravenous Q12H  . tranexamic acid  15 mg/kg Intravenous To OR  . tranexamic acid (CYKLOKAPRON) infusion ([facility])  1.5 mg/kg/hr Intravenous To OR  . vancomycin  1,500 mg Intravenous To OR  . vancomycin  1,000 mg Intravenous Once  . DISCONTD: amLODipine  5 mg Oral BID  . DISCONTD: aspirin  81 mg Oral Daily  . DISCONTD: cefUROXime (ZINACEF)  IV  750 mg Intravenous To OR  . DISCONTD: DOPamine  2-20 mcg/kg/min Intravenous To OR  . DISCONTD: epinephrine  0.5-20 mcg/min Intravenous To OR  . DISCONTD: insulin regular  0-10 Units Intravenous TID WC  . DISCONTD: magnesium sulfate  40 mEq Other To OR  . DISCONTD: metoprolol tartrate  25 mg Oral BID  . DISCONTD: nitroGLYCERIN  2-200 mcg/min Intravenous To OR  . DISCONTD: potassium chloride  80 mEq Other To OR  . DISCONTD: tranexamic acid  2 mg/kg Intracatheter To OR   Assessment/Plan: Patient Active Problem List  Diagnoses  . HYPERLIPIDEMIA  . HYPERTENSION  . History of prior pulm embolism  . PULMONARY HYPERTENSION  . Unstable angina  . CAD (coronary artery disease) with history of LAD & Diag athrectomy in 1994, now with severe 3 vesseal CAD by cath 12/14/11 for CABG  . AS (aortic stenosis) moderate to severe by cath 12/14/11 for AVR   . Chronic anticoagulation, on coumadin for pul emboli  . Carotid artery disease, bilateral, asymptomatic  . COPD by CXR   PLAN:1 Day  Post-Op  Procedure(s) (LRB):  CORONARY ARTERY BYPASS GRAFTING (CABG) (N/A)  AORTIC VALVE REPLACEMENT (AVR) (N/A)  CYSTOSCOPY (N/A)      LOS: 7 days   INGOLD,LAURA R 12/21/2011, 9:06 AM   Agree with note written by Nada Boozer RNP  POD # 1 CABG x 3/ tissue AVR. Looks great. VSS. Hemodynamically stable on Neo. Labs OK. NSR. Exam benign except for 1+ pitting edema. Wean drips, gentle diuresis. Treatment per TCTS.  Runell Gess 12/21/2011 9:12 AM

## 2011-12-21 NOTE — Care Management Note (Unsigned)
    Page 1 of 1   12/25/2011     3:31:31 PM   CARE MANAGEMENT NOTE 12/25/2011  Patient:  Curtis Brennan,Curtis Brennan   Account Number:  [id number]  Date Initiated:  12/21/2011  Documentation initiated by:  Arieana Somoza  Subjective/Objective Assessment:   PT S/P CABG X3 AND AVR ON 12/20/11.  PTA, PT INDEPENDENT, LIVES WITH SPOUSE.     Action/Plan:   MET WITH PT TO DISCUSS DC PLANS.  PT STATES WIFE WILL PROVIDE 24HR CARE AT DISCHARGE.  WILL FOLLOW FOR HOME NEEDS AS PT PROGRESSES.   Anticipated DC Date:  12/25/2011   Anticipated DC Plan:  HOME W HOME HEALTH SERVICES      DC Planning Services  CM consult      Choice offered to / List presented to:             Status of service:  In process, will continue to follow Medicare Important Message given?   (If response is "NO", the following Medicare IM given date fields will be blank) Date Medicare IM given:   Date Additional Medicare IM given:    Discharge Disposition:    Per UR Regulation:    If discussed at Long Length of Stay Meetings, dates discussed:    Comments:  12/25/11 Curtis Weckwerth,RN,BSN 1130 POSSIBLE DC HOME TOMORROW.  PT DENIES ANY NEEDS CURRENTLY--STATES HE THINKS THEY HAVE A WALKER AT HOME, IF NEEDED.

## 2011-12-22 ENCOUNTER — Inpatient Hospital Stay (HOSPITAL_COMMUNITY): Payer: Medicare Other

## 2011-12-22 LAB — BASIC METABOLIC PANEL
BUN: 20 mg/dL (ref 6–23)
CO2: 24 mEq/L (ref 19–32)
Calcium: 8.4 mg/dL (ref 8.4–10.5)
Chloride: 102 mEq/L (ref 96–112)
Creatinine, Ser: 0.8 mg/dL (ref 0.50–1.35)
GFR calc Af Amer: >90 mL/min (ref >90)
GFR calc non Af Amer: 84 mL/min — ABNORMAL LOW (ref >90)
Glucose, Bld: 124 mg/dL — ABNORMAL HIGH (ref 70–99)
Potassium: 3.9 mEq/L (ref 3.5–5.1)
Sodium: 134 mEq/L — ABNORMAL LOW (ref 135–145)

## 2011-12-22 LAB — CBC
HCT: 30.8 % — ABNORMAL LOW (ref 39.0–52.0)
Hemoglobin: 10.8 g/dL — ABNORMAL LOW (ref 13.0–17.0)
MCH: 32.5 pg (ref 26.0–34.0)
MCHC: 35.1 g/dL (ref 30.0–36.0)
MCV: 92.8 fL (ref 78.0–100.0)
Platelets: 71 10*3/uL — ABNORMAL LOW (ref 150–400)
RBC: 3.32 MIL/uL — ABNORMAL LOW (ref 4.22–5.81)
RDW: 12.6 % (ref 11.5–15.5)
WBC: 8.5 10*3/uL (ref 4.0–10.5)

## 2011-12-22 LAB — GLUCOSE, CAPILLARY
Glucose-Capillary: 111 mg/dL — ABNORMAL HIGH (ref 70–99)
Glucose-Capillary: 130 mg/dL — ABNORMAL HIGH (ref 70–99)
Glucose-Capillary: 136 mg/dL — ABNORMAL HIGH (ref 70–99)
Glucose-Capillary: 128 mg/dL — ABNORMAL HIGH (ref 70–99)
Glucose-Capillary: 106 mg/dL — ABNORMAL HIGH (ref 70–99)

## 2011-12-22 MED ORDER — ASPIRIN EC 81 MG PO TBEC
81.0000 mg | DELAYED_RELEASE_TABLET | Freq: Every day | ORAL | Status: DC
  Administered 2011-12-22 – 2011-12-27 (×6): 81 mg via ORAL
  Filled 2011-12-22 (×6): qty 1

## 2011-12-22 MED ORDER — ONDANSETRON HCL 4 MG/2ML IJ SOLN: 4.0000 mg | Freq: Four times a day (QID) | INTRAMUSCULAR | Status: DC | PRN

## 2011-12-22 MED ORDER — BISACODYL 10 MG RE SUPP: 10.0000 mg | Freq: Every day | RECTAL | Status: DC | PRN

## 2011-12-22 MED ORDER — LISINOPRIL 2.5 MG PO TABS
2.5000 mg | ORAL_TABLET | Freq: Every day | ORAL | Status: DC
  Administered 2011-12-22 – 2011-12-27 (×5): 2.5 mg via ORAL
  Filled 2011-12-22 (×6): qty 1

## 2011-12-22 MED ORDER — DOCUSATE SODIUM 100 MG PO CAPS
200.0000 mg | ORAL_CAPSULE | Freq: Every day | ORAL | Status: DC
  Administered 2011-12-23: 200 mg via ORAL
  Filled 2011-12-22 (×5): qty 2

## 2011-12-22 MED ORDER — WARFARIN SODIUM 2.5 MG PO TABS
2.5000 mg | ORAL_TABLET | Freq: Once | ORAL | Status: AC
  Administered 2011-12-22: 2.5 mg via ORAL
  Filled 2011-12-22 (×3): qty 1

## 2011-12-22 MED ORDER — MOVING RIGHT ALONG BOOK
Freq: Once | Status: AC
  Administered 2011-12-22: 13:00:00
  Filled 2011-12-22: qty 1

## 2011-12-22 MED ORDER — SODIUM CHLORIDE 0.9 % IV SOLN: 250.0000 mL | INTRAVENOUS | Status: DC | PRN

## 2011-12-22 MED ORDER — ONDANSETRON HCL 4 MG PO TABS: 4.0000 mg | ORAL_TABLET | Freq: Four times a day (QID) | ORAL | Status: DC | PRN

## 2011-12-22 MED ORDER — SODIUM CHLORIDE 0.9 % IJ SOLN
3.0000 mL | Freq: Two times a day (BID) | INTRAMUSCULAR | Status: DC
  Administered 2011-12-22 – 2011-12-27 (×9): 3 mL via INTRAVENOUS

## 2011-12-22 MED ORDER — OXYCODONE HCL 5 MG PO TABS
5.0000 mg | ORAL_TABLET | ORAL | Status: DC | PRN
  Filled 2011-12-22: qty 2

## 2011-12-22 MED ORDER — PANTOPRAZOLE SODIUM 40 MG PO TBEC
40.0000 mg | DELAYED_RELEASE_TABLET | Freq: Every day | ORAL | Status: DC
  Administered 2011-12-23 – 2011-12-27 (×5): 40 mg via ORAL
  Filled 2011-12-22 (×6): qty 1

## 2011-12-22 MED ORDER — POTASSIUM CHLORIDE CRYS ER 20 MEQ PO TBCR
20.0000 meq | EXTENDED_RELEASE_TABLET | Freq: Every day | ORAL | Status: DC
  Administered 2011-12-22 – 2011-12-27 (×6): 20 meq via ORAL
  Filled 2011-12-22 (×6): qty 1

## 2011-12-22 MED ORDER — FUROSEMIDE 40 MG PO TABS
40.0000 mg | ORAL_TABLET | Freq: Every day | ORAL | Status: AC
  Administered 2011-12-22 – 2011-12-24 (×3): 40 mg via ORAL
  Filled 2011-12-22 (×3): qty 1

## 2011-12-22 MED ORDER — METOPROLOL TARTRATE 12.5 MG HALF TABLET
12.5000 mg | ORAL_TABLET | Freq: Two times a day (BID) | ORAL | Status: DC
  Administered 2011-12-22 (×2): 12.5 mg via ORAL
  Filled 2011-12-22 (×5): qty 1

## 2011-12-22 MED ORDER — ACETAMINOPHEN 325 MG PO TABS
650.0000 mg | ORAL_TABLET | Freq: Four times a day (QID) | ORAL | Status: DC | PRN
  Administered 2011-12-23 – 2011-12-27 (×10): 650 mg via ORAL
  Filled 2011-12-22 (×11): qty 2

## 2011-12-22 MED ORDER — TRAMADOL HCL 50 MG PO TABS: 50.0000 mg | ORAL_TABLET | ORAL | Status: DC | PRN

## 2011-12-22 MED ORDER — WARFARIN - PHYSICIAN DOSING INPATIENT
Freq: Every day | Status: DC
  Administered 2011-12-25: 18:00:00

## 2011-12-22 MED ORDER — SODIUM CHLORIDE 0.9 % IJ SOLN: 3.0000 mL | INTRAMUSCULAR | Status: DC | PRN

## 2011-12-22 MED ORDER — BISACODYL 5 MG PO TBEC
10.0000 mg | DELAYED_RELEASE_TABLET | Freq: Every day | ORAL | Status: DC | PRN
  Administered 2011-12-22: 10 mg via ORAL
  Filled 2011-12-22: qty 2

## 2011-12-22 MED ORDER — INSULIN ASPART 100 UNIT/ML ~~LOC~~ SOLN
0.0000 [IU] | Freq: Three times a day (TID) | SUBCUTANEOUS | Status: DC
  Administered 2011-12-23: 4 [IU] via SUBCUTANEOUS
  Administered 2011-12-23 – 2011-12-25 (×4): 2 [IU] via SUBCUTANEOUS
  Administered 2011-12-26: 4 [IU] via SUBCUTANEOUS

## 2011-12-22 MED FILL — Electrolyte-R (PH 7.4) Solution: INTRAVENOUS | Qty: 3000 | Status: AC

## 2011-12-22 MED FILL — Sodium Bicarbonate IV Soln 8.4%: INTRAVENOUS | Qty: 50 | Status: AC

## 2011-12-22 MED FILL — Lidocaine HCl IV Inj 20 MG/ML: INTRAVENOUS | Qty: 5 | Status: AC

## 2011-12-22 MED FILL — Heparin Sodium (Porcine) Inj 1000 Unit/ML: INTRAMUSCULAR | Qty: 30 | Status: AC

## 2011-12-22 MED FILL — Heparin Sodium (Porcine) Inj 1000 Unit/ML: INTRAMUSCULAR | Qty: 10 | Status: AC

## 2011-12-22 MED FILL — Sodium Chloride Irrigation Soln 0.9%: Qty: 3000 | Status: AC

## 2011-12-22 MED FILL — Mannitol IV Soln 20%: INTRAVENOUS | Qty: 500 | Status: AC

## 2011-12-22 MED FILL — Sodium Chloride IV Soln 0.9%: INTRAVENOUS | Qty: 1000 | Status: AC

## 2011-12-22 NOTE — Progress Notes (Signed)
[facility]  Subjective: Doing well.  No specific complaints.  Drifts of to sleep easily.  Objective: Vital signs in last 24 hours: Temp:  [96.9 F (36.1 C)-98.3 F (36.8 C)] 98 F (36.7 C) (05/31 0748) Pulse Rate:  [45-98] 89  (05/31 0900) Resp:  [11-38] 18  (05/31 0900) BP: (89-139)/(47-85) 104/54 mmHg (05/31 0900) SpO2:  [88 %-97 %] 94 % (05/31 0900) Arterial Line BP: (85-150)/(31-73) 110/43 mmHg (05/31 0400) Weight:  [98 kg (216 lb 0.8 oz)] 98 kg (216 lb 0.8 oz) (05/31 0600) Last BM Date: 12/19/11  Intake/Output from previous day: 05/30 0701 - 05/31 0700 In: 1298.5 [P.O.:540; I.V.:658.5; IV Piggyback:100] Out: 1050 [Urine:810; Chest Tube:240] Intake/Output this shift: Total I/O In: 40 [I.V.:40] Out: 160 [Urine:110; Chest Tube:50]  Medications Current Facility-Administered Medications  Medication Dose Route Frequency Provider Last Rate Last Dose  . 0.9 %  sodium chloride infusion  250 mL Intravenous PRN [name], MD      . acetaminophen (TYLENOL) tablet 650 mg  650 mg Oral Q6H PRN [name], MD      . albumin human 5 % solution 250 mL  250 mL Intravenous Q15 min PRN Rae Roam Barrett, PA   250 mL at 12/20/11 1905  . aspirin EC tablet 81 mg  81 mg Oral Daily [name], MD      . atorvastatin (LIPITOR) tablet 40 mg  40 mg Oral q1800 Marykay Lex, MD   40 mg at 12/19/11 1654  . bisacodyl (DULCOLAX) EC tablet 10 mg  10 mg Oral Daily PRN [name], MD       Or  . bisacodyl (DULCOLAX) suppository 10 mg  10 mg Rectal Daily PRN [name], MD      . docusate sodium (COLACE) capsule 200 mg  200 mg Oral Daily Erin R Barrett, PA   200 mg at 12/21/11 1055  . docusate sodium (COLACE) capsule 200 mg  200 mg Oral Daily [name], MD      . famotidine (PEPCID) IVPB 20 mg  20 mg Intravenous Q12H Erin R Barrett, PA      . furosemide (LASIX) tablet 40 mg  40 mg Oral Daily [name], MD      . insulin  aspart (novoLOG) injection 0-24 Units  0-24 Units Subcutaneous Q4H [name], MD   2 Units at 12/22/11 0805  . insulin aspart (novoLOG) injection 0-24 Units  0-24 Units Subcutaneous TID AC & HS [name], MD      . insulin glargine (LANTUS) injection 20 Units  20 Units Subcutaneous BH-q7a [name], MD   20 Units at 12/22/11 [phone]  . lisinopril (PRINIVIL,ZESTRIL) tablet 2.5 mg  2.5 mg Oral Daily [name], MD      . metoprolol tartrate (LOPRESSOR) tablet 12.5 mg  12.5 mg Oral BID Erin R Barrett, PA       Or  . metoprolol tartrate (LOPRESSOR) 25 mg/10 mL oral suspension 12.5 mg  12.5 mg Per Tube BID Erin R Barrett, PA      . metoprolol tartrate (LOPRESSOR) tablet 12.5 mg  12.5 mg Oral BID [name], MD      . moving right along book   Does not apply Once [name], MD      . ondansetron [facility]) tablet 4 mg  4 mg Oral Q6H PRN [name], MD  Or  . ondansetron (ZOFRAN) injection 4 mg  4 mg Intravenous Q6H PRN [name], MD      . oxyCODONE (Oxy IR/ROXICODONE) immediate release tablet 5-10 mg  5-10 mg Oral Q3H PRN Rae Roam Barrett, PA   10 mg at 12/22/11 0631  . oxyCODONE (Oxy IR/ROXICODONE) immediate release tablet 5-10 mg  5-10 mg Oral Q3H PRN [name], MD      . pantoprazole (PROTONIX) EC tablet 40 mg  40 mg Oral QAC breakfast [name], MD      . potassium chloride SA (K-DUR,KLOR-CON) CR tablet 20 mEq  20 mEq Oral Daily [name], MD      . sodium chloride 0.9 % injection 3 mL  3 mL Intravenous Q12H [name], MD      . sodium chloride 0.9 % injection 3 mL  3 mL Intravenous PRN [name], MD      . traMADol Janean Sark) tablet 50 mg  50 mg Oral Q6H PRN [name], MD   50 mg at 12/22/11 0805  . traMADol (ULTRAM) tablet 50-100 mg  50-100 mg Oral Q4H PRN [name], MD      . warfarin (COUMADIN) tablet 2.5 mg  2.5 mg Oral ONCE-1800 [name], MD      . Warfarin - Physician  Dosing Inpatient   Does not apply q1800 Fredrik Rigger, [facility]      . DISCONTD: 0.45 % sodium chloride infusion   Intravenous Continuous Erin R Barrett, PA 20 mL/hr at 12/20/11 1730 20 mL at 12/20/11 1730  . DISCONTD: 0.9 %  sodium chloride infusion   Intravenous Continuous Erin R Barrett, PA 20 mL/hr at 12/20/11 1700 20 mL at 12/20/11 1700  . DISCONTD: 0.9 %  sodium chloride infusion  250 mL Intravenous Continuous Erin R Barrett, PA      . DISCONTD: acetaminophen (TYLENOL) solution 975 mg  975 mg Per Tube Q6H Erin R Barrett, PA      . DISCONTD: acetaminophen (TYLENOL) tablet 1,000 mg  1,000 mg Oral Q6H Erin R Barrett, PA   1,000 mg at 12/22/11 0622  . DISCONTD: aspirin chewable tablet 324 mg  324 mg Per Tube Daily Erin R Barrett, PA      . DISCONTD: aspirin EC tablet 325 mg  325 mg Oral Daily Erin R Barrett, PA   325 mg at 12/21/11 1055  . DISCONTD: bisacodyl (DULCOLAX) EC tablet 10 mg  10 mg Oral Daily Erin R Barrett, PA   10 mg at 12/21/11 1055  . DISCONTD: bisacodyl (DULCOLAX) suppository 10 mg  10 mg Rectal Daily Erin R Barrett, PA      . DISCONTD: cefUROXime (ZINACEF) 1.5 g in dextrose 5 % 50 mL IVPB  1.5 g Intravenous Q12H Erin R Barrett, PA   1.5 g at 12/21/11 2259  . DISCONTD: dexmedetomidine (PRECEDEX) 200 mcg in sodium chloride 0.9 % 50 mL infusion  0.1-0.7 mcg/kg/hr Intravenous Continuous Erin R Barrett, PA   0.1 mcg/kg/hr at 12/20/11 2015  . DISCONTD: lactated ringers infusion   Intravenous Continuous Erin R Barrett, PA 20 mL/hr at 12/22/11 0900    . DISCONTD: metoprolol (LOPRESSOR) injection 2.5-5 mg  2.5-5 mg Intravenous Q2H PRN Erin R Barrett, PA      . DISCONTD: midazolam (VERSED) injection 2 mg  2 mg Intravenous Q1H PRN Erin R Barrett, PA      . DISCONTD: morphine 2 MG/ML injection 2-5 mg  2-5 mg  Intravenous Q1H PRN Rae Roam Barrett, PA   2 mg at 12/21/11 1638  . DISCONTD: nitroGLYCERIN 0.2 mg/mL in dextrose 5 % infusion  0-100 mcg/min Intravenous Continuous Erin R Barrett, PA       . DISCONTD: ondansetron (ZOFRAN) injection 4 mg  4 mg Intravenous Q6H PRN Erin R Barrett, PA      . DISCONTD: pantoprazole (PROTONIX) EC tablet 40 mg  40 mg Oral Q1200 Erin R Barrett, PA      . DISCONTD: phenylephrine (NEO-SYNEPHRINE) 20,000 mcg in dextrose 5 % 250 mL infusion  0-100 mcg/min Intravenous Continuous Erin R Barrett, PA   5 mcg/min at 12/21/11 1900  . DISCONTD: sodium chloride 0.9 % injection 3 mL  3 mL Intravenous Q12H Erin R Barrett, PA   3 mL at 12/21/11 1059  . DISCONTD: sodium chloride 0.9 % injection 3 mL  3 mL Intravenous PRN Rae Roam Barrett, PA        PE: General appearance: alert, cooperative and no distress Lungs: clear to auscultation bilaterally Heart: regular rate and rhythm,  AV clicks erratic.  No MM. Abdomen: +BS all quads Extremities: 2+ LEE Pulses: Radials 2+ and symmetric  Lab Results:   Basename 12/22/11 0421 12/21/11 1709 12/21/11 1648 12/21/11 0400  WBC 8.5 -- 13.0* 9.4  HGB 10.8* 11.2* 11.7* --  HCT 30.8* 33.0* 33.2* --  PLT 71* -- 94* 90*   BMET  Basename 12/22/11 0421 12/21/11 1709 12/21/11 1648 12/21/11 0400 12/20/11 0227  NA 134* 136 -- 135 --  K 3.9 4.2 -- 4.1 --  CL 102 104 -- 103 --  CO2 24 -- -- 22 27  GLUCOSE 124* 149* -- 129* --  BUN 20 19 -- 15 --  CREATININE 0.80 0.90 0.89 -- --  CALCIUM 8.4 -- -- 7.8* 9.3   PT/INR  Basename 12/20/11 1725  LABPROT 19.1*  INR 1.57*   Studies/Results: 12/21/11,  PORTABLE CHEST - 1 VIEW  Comparison: 12/20/2011.  Findings: Endotracheal tube has been removed, as has the  nasogastric tube. Right IJ Swan-Ganz catheter tip projects over  the proximal right pulmonary artery. Mediastinal drain, left chest  tube and epicardial pacer wires remain in place.  Cardiomediastinal silhouette is prominent, as before, and is  presumably postoperative in etiology. Lungs are low in volume with  streaky densities at both lung bases. There may be a small left  pleural effusion. No definite pneumothorax.    IMPRESSION:  1. Low lung volumes with bibasilar atelectasis.  2. No definite pneumothorax with left chest tube in place.  3. Probable small left pleural effusion.  Assessment/Plan  Principal Problem:  *Unstable angina Active Problems:  HYPERLIPIDEMIA  HYPERTENSION  History of prior pulm embolism  CAD (coronary artery disease) with history of LAD & Diag athrectomy in 1994, now with severe 3 vesseal CAD by cath 12/14/11 for CABG  AS (aortic stenosis) moderate to severe by cath 12/14/11 for AVR   Chronic anticoagulation, on coumadin for pul emboli  Carotid artery disease, bilateral, asymptomatic  COPD by CXR  Plan:  POD # 2. CABG x3: LIMA to LAD, SVG to PD, SVG to OM and aortic valve replacement with an [employer] 25mm Pericardial Tissue Valve .  ASA/ lipitor/ lasix/ lisinopril/lopressor/coumadin/K+.  Transferring to 2000.  BP 104/54 - 123/51.       LOS: 8 days    HAGER, BRYAN 12/22/2011 10:42 AM   Patient seen and examined. Agree with assessment and plan. Just transferred to 2000.  S/P  CABG/AVR with tissue pericardial valve. To initiae short term coumadin per Dr. Tyrone Sage. Notes soreness. Breathing well. Sinus rhythm at 95. Continue  Diuresis and mobilization.   Lennette Bihari, MD, [facility] 12/22/2011 5:07 PM

## 2011-12-22 NOTE — Op Note (Signed)
NAMEJABRIEL, Curtis Brennan              ACCOUNT NO.:  [id number]  MEDICAL RECORD NO.:  [id number]  LOCATION:                                 FACILITY:  PHYSICIAN:  Sheliah Plane, MD    DATE OF BIRTH:  03/14/1932  DATE OF PROCEDURE:  12/20/2011 DATE OF DISCHARGE:                              OPERATIVE REPORT   [id number]  PREOPERATIVE DIAGNOSES:  Coronary occlusive disease with left main obstruction and aortic stenosis.  POSTOPERATIVE DIAGNOSES:  Coronary occlusive disease with left main obstruction and aortic stenosis.  SURGICAL PROCEDURE:  Aortic valve replacement with pericardial tissue valve, [employer], model 3300TFX, 23 mm, serial number [id number], and coronary artery bypass grafting x3 with the left internal mammary to the left anterior descending coronary artery, reverse saphenous vein graft to the circumflex coronary artery, reverse saphenous vein graft to the posterior descending coronary artery arising off the distal circumflex with left thigh and calf Endovein harvesting.  SURGEON:  Sheliah Plane, M.D.  FIRST ASSISTANT:  Lowella Dandy, PA.  BRIEF HISTORY:  The patient is a 78 year old male with known coronary occlusive disease, having had complex rotoblade treatment for proximal LAD disease in the past.  He presented with a several-month history of increasing shortness of breath, dyspnea on exertion, and chest discomfort to the point where he could no longer walk to his mailbox. He was evaluated by Dr. Allyson Sabal and was found to have progressive critical aortic stenosis with a velocity across his aortic valve of greater than 4 cm/second.  In addition, cardiac catheterization revealed 60% left main, 90% LAD, 80% circumflex in a dominant left system.  Because of the patient's concomitant coronary artery disease and critical aortic stenosis, aortic valve replacement and coronary artery bypass grafting was recommended to the patient who agreed and signed the  informed consent.  DESCRIPTION OF PROCEDURE:  With Swan-Ganz arterial line and monitors in place, the patient underwent general endotracheal anesthesia without incident.  When we attempted to place a Foley, the catheter would not pass as the patient previously had a prostatectomy done.  Urology was called and performed cysto and placement of Foley, dictated under separate note.  With this accomplished, we then proceeded with prepping the chest, abdomen, and extremities.  Using the [employer] Endovein harvesting system, vein was harvested from the left thigh and calf and was of good quality.  Median sternotomy was performed.  Left internal mammary artery was dissected down as a pedicle graft.  The distal artery was divided and had good free flow.  The pericardium was opened, overall ventricular function appeared preserved.  The patient had a very trace mitral regurgitation on TEE and evidence of left ventricular hypertrophy and a highly calcified trileaflet aortic valve.  The patient was systemically heparinized.  Ascending aorta was cannulated.  The right atrium was cannulated.  Retrograde cardioplegia catheter was placed.  He was placed on cardiopulmonary bypass 2.4 L/minute/m square.  Sites of anastomosis were selected and dissected out of the epicardium.  The patient's body temperature was then cooled to 32 degrees.  An aortic cross-clamp was applied.  A 600 mL of cold blood potassium cardioplegia was administered antegrade.  Additional retrograde cardioplegia was administered.  Topical cold was used.  Myocardial septal temperatures monitored throughout crossclamp.  Attention was turned first to the performance of the coronary grafts.  The posterior descending coronary artery was a relatively small vessel that was terminating from the distal circumflex, but the largest of the distal branch vessel was opened and admitted 1 mm probe distally and 1.5 mm probe proximally. Using a running  7-0 Prolene, distal anastomosis was performed. Attention was then turned to a large diffusely diseased circumflex branch in the mid to distal portion of the vessel that was opened in the area of relatively free of disease, but there was still significant calcification throughout the vessel.  Using a running 7-0 Prolene, distal anastomosis was performed.  Attention was then turned to the left anterior descending coronary artery, which was intra-myocardial for the proximal 2/3 of the vessel.  The distal-third of the LAD was visible and dissected out, admitted 1.5 mm probe proximally and distally.  Using running 8-0 Prolene, left internal mammary artery was anastomosed to the left anterior descending coronary artery.  With the grafts completed, the transverse aortotomy was performed.  This revealed a trileaflet aortic valve highly calcified.  The valve was excised and anulus debrided.  A 23 pericardial tissue valve was selected for implantation. Using #2 TiCron pledgeted sutures with 14 total with the pledgets on the ventricular surface, the aortic valve was then secured in place.  The valve seated well.  After careful inspection and removal of all loose calcific debris, the aorta was closed with a horizontal mattress 4-0 Prolene suture over felt strips.  With the crossclamp still in place, two punch aortotomies were performed.  The 2 vein grafts were anastomosed to the ascending aorta.  The heart was allowed to passively fill and de-air.  The bulldog on the mammary artery was removed with prompt rise in myocardial septal temperature.  Warm retrograde plegia was administered.  The aortotomies were completed and the aortic crossclamp was removed.  Total cross-clamp time was 157 minutes.  The right superior pulmonary vein then had been placed.  A __________ needle was placed into the ascending aorta to further evacuate air.  Carbon dioxide had also been infused in to the pericardial space  during the procedure.  The patient required electric defibrillation turn to a sinus rhythm.  He was atrially paced, increased the rate.  Sites of anastomosis were inspected and free of bleeding.  The patient was then ventilated and weaned from cardiopulmonary bypass without difficulty and remained hemodynamically stable.  He was decannulated in the usual fashion.  Protamine sulfate was administered with operative field hemostatic.  Left pleural tube and Blake mediastinal drain were left in place.  Pericardium was loosely reapproximated.  The TEE performed by Dr. Randa Evens and also Dr. Krista Blue, showed good function of the aortic prosthesis and trace mitral regurgitation.  The sternum was closed with #6 stainless steel wire.  Fascia was closed with interrupted 0 Vicryl, running 3-0 Vicryl in subcutaneous tissue, 4-0 subcuticular stitch in skin edges.  Dry dressings were applied.  Sponge and needle count was reported as correct at the completion of procedure.  The patient tolerated the procedure well without obvious complication and was transferred to Surgical Intensive Care Unit having tolerated the procedure without obvious complication.     Sheliah Plane, MD     EG/MEDQ  D:  12/21/2011  T:  12/21/2011  Job:  [id number]

## 2011-12-22 NOTE — Progress Notes (Addendum)
Patient ID: Curtis Brennan, male   DOB: 02-Apr-1932, 78 y.o.   MRN: [id number] TCTS DAILY PROGRESS NOTE                   [address]            [place] [address]          [phone]      2 Days Post-Op Procedure(s) (LRB): CORONARY ARTERY BYPASS GRAFTING (CABG) (N/A) AORTIC VALVE REPLACEMENT (AVR) (N/A) CYSTOSCOPY (N/A)  Total Length of Stay:  LOS: 8 days   Subjective: Feels much better today,   Objective: Vital signs in last 24 hours: Temp:  [96.9 F (36.1 C)-98.3 F (36.8 C)] 98 F (36.7 C) (05/31 0748) Pulse Rate:  [45-98] 98  (05/31 0700) Cardiac Rhythm:  [-] Normal sinus rhythm (05/30 1800) Resp:  [11-38] 23  (05/31 0700) BP: (89-139)/(46-85) 123/51 mmHg (05/31 0700) SpO2:  [88 %-97 %] 94 % (05/31 0700) Arterial Line BP: (85-150)/(31-73) 110/43 mmHg (05/31 0400) Weight:  [216 lb 0.8 oz (98 kg)] 216 lb 0.8 oz (98 kg) (05/31 0600)  Filed Weights   12/20/11 0422 12/21/11 0500 12/22/11 0600  Weight: 206 lb (93.441 kg) 216 lb 12.8 oz (98.34 kg) 216 lb 0.8 oz (98 kg)    Weight change: -12 oz (-0.34 kg)   Hemodynamic parameters for last 24 hours: PAP: (30-32)/(16-18) 32/16 mmHg  Intake/Output from previous day: 05/30 0701 - 05/31 0700 In: 1298.5 [P.O.:540; I.V.:658.5; IV Piggyback:100] Out: 1050 [Urine:810; Chest Tube:240]  Intake/Output this shift:    Current Meds: Scheduled Meds:   . acetaminophen  1,000 mg Oral Q6H   Or  . acetaminophen (TYLENOL) oral liquid 160 mg/5 mL  975 mg Per Tube Q6H  . aspirin EC  325 mg Oral Daily   Or  . aspirin  324 mg Per Tube Daily  . atorvastatin  40 mg Oral q1800  . bisacodyl  10 mg Oral Daily   Or  . bisacodyl  10 mg Rectal Daily  . cefUROXime (ZINACEF)  IV  1.5 g Intravenous Q12H  . docusate sodium  200 mg Oral Daily  . famotidine (PEPCID) IV  20 mg Intravenous Q12H  . insulin aspart  0-24 Units Subcutaneous Q4H  . insulin glargine  20 Units Subcutaneous BH-q7a  . metoprolol tartrate  12.5 mg Oral  BID   Or  . metoprolol tartrate  12.5 mg Per Tube BID  . pantoprazole  40 mg Oral Q1200  . sodium chloride  3 mL Intravenous Q12H  . DISCONTD: insulin regular  0-10 Units Intravenous TID WC   Continuous Infusions:   . sodium chloride 20 mL (12/20/11 1730)  . sodium chloride 20 mL (12/20/11 1700)  . sodium chloride    . dexmedetomidine (PRECEDEX) IV infusion Stopped (12/20/11 2030)  . lactated ringers 20 mL/hr at 12/22/11 0600  . nitroGLYCERIN    . phenylephrine (NEO-SYNEPHRINE) Adult infusion Stopped (12/21/11 2000)  . DISCONTD: insulin (NOVOLIN-R) infusion 5.8 Units/hr (12/21/11 0800)   PRN Meds:.albumin human, metoprolol, midazolam, morphine injection, ondansetron (ZOFRAN) IV, oxyCODONE, sodium chloride, traMADol  General appearance: alert, cooperative and no distress Neurologic: intact Heart: regular rate and rhythm, S1, S2 normal, no murmur, click, rub or gallop and normal apical impulse Lungs: clear to auscultation bilaterally and normal percussion bilaterally Abdomen: soft, non-tender; bowel sounds normal; no masses,  no organomegaly Extremities: extremities normal, atraumatic, no cyanosis or edema and Homans sign is negative, no sign of DVT foley in place  Lab Results: CBC: Basename 12/22/11 0421 12/21/11 1709 12/21/11 1648  WBC 8.5 -- 13.0*  HGB 10.8* 11.2* --  HCT 30.8* 33.0* --  PLT 71* -- 94*   BMET:  Basename 12/22/11 0421 12/21/11 1709 12/21/11 0400  NA 134* 136 --  K 3.9 4.2 --  CL 102 104 --  CO2 24 -- 22  GLUCOSE 124* 149* --  BUN 20 19 --  CREATININE 0.80 0.90 --  CALCIUM 8.4 -- 7.8*    PT/INR:  Basename 12/20/11 1725  LABPROT 19.1*  INR 1.57*   Radiology: Dg Chest Portable 1 View In Am  12/21/2011  *RADIOLOGY REPORT*  Clinical Data: Postop.  PORTABLE CHEST - 1 VIEW  Comparison: 12/20/2011.  Findings: Endotracheal tube has been removed, as has the nasogastric tube.  Right IJ Swan-Ganz catheter tip projects over the proximal right pulmonary artery.   Mediastinal drain, left chest tube and epicardial pacer wires remain in place.  Cardiomediastinal silhouette is prominent, as before, and is presumably postoperative in etiology.  Lungs are low in volume with streaky densities at both lung bases.  There may be a small left pleural effusion.  No definite pneumothorax.  IMPRESSION:  1.  Low lung volumes with bibasilar atelectasis. 2.  No definite pneumothorax with left chest tube in place. 3.  Probable small left pleural effusion.  Original Report Authenticated By: Reyes Ivan, M.D.   Dg Chest Portable 1 View  12/20/2011  *RADIOLOGY REPORT*  Clinical Data: Status post CABG.  PORTABLE CHEST - 1 VIEW  Comparison: Two-view chest 12/19/2011.  Findings: The patient is intubated. The endotracheal tube terminates 3.3 cm above the carina.  An NG tube courses off the inferior border of the film.  A Swan-Ganz catheter terminates in the main pulmonary outflow tract.  It enters via a right IJ sheath. A left-sided chest tube and mediastinal drain are in place.  There is no pneumothorax.  Bibasilar airspace disease likely reflects atelectasis.  The patient is status post median sternotomy for CABG and aortic valve replacement.  IMPRESSION:  1.  Postsurgical changes as described. 2.  The support apparatus is in satisfactory position. 3.  Cardiomegaly with bibasilar airspace disease, likely reflecting atelectasis.  Original Report Authenticated By: Jamesetta Orleans. MATTERN, M.D.     Assessment/Plan: S/P Procedure(s) (LRB): CORONARY ARTERY BYPASS GRAFTING (CABG) (N/A) AORTIC VALVE REPLACEMENT (AVR) (N/A) CYSTOSCOPY (N/A) for placement of foley after prost ectomy 1999 Mobilize Diuresis Diabetes control, back on lantus ans ss d/c tubes/lines Continue foley due to bladder outlet obstruction, try to d/c foley in am when ambulating well Plan for transfer to step-down: see transfer orders resume coumadin now was on for history of PE several years ago,    [name] MD  Beeper [phone] Office [phone] 12/22/2011 8:27 AM

## 2011-12-23 ENCOUNTER — Inpatient Hospital Stay (HOSPITAL_COMMUNITY): Payer: Medicare Other

## 2011-12-23 LAB — GLUCOSE, CAPILLARY
Glucose-Capillary: 194 mg/dL — ABNORMAL HIGH (ref 70–99)
Glucose-Capillary: 147 mg/dL — ABNORMAL HIGH (ref 70–99)

## 2011-12-23 LAB — BASIC METABOLIC PANEL
BUN: 24 mg/dL — ABNORMAL HIGH (ref 6–23)
CO2: 26 mEq/L (ref 19–32)
Calcium: 8.7 mg/dL (ref 8.4–10.5)
Chloride: 98 mEq/L (ref 96–112)
Creatinine, Ser: 0.91 mg/dL (ref 0.50–1.35)
GFR calc Af Amer: >90 mL/min (ref >90)
GFR calc non Af Amer: 80 mL/min — ABNORMAL LOW (ref >90)
Glucose, Bld: 126 mg/dL — ABNORMAL HIGH (ref 70–99)
Potassium: 3.7 mEq/L (ref 3.5–5.1)
Sodium: 135 mEq/L (ref 135–145)

## 2011-12-23 LAB — CBC
HCT: 31.7 % — ABNORMAL LOW (ref 39.0–52.0)
Hemoglobin: 10.7 g/dL — ABNORMAL LOW (ref 13.0–17.0)
MCH: 31.9 pg (ref 26.0–34.0)
MCHC: 33.8 g/dL (ref 30.0–36.0)
MCV: 94.6 fL (ref 78.0–100.0)
Platelets: 83 10*3/uL — ABNORMAL LOW (ref 150–400)
RBC: 3.35 MIL/uL — ABNORMAL LOW (ref 4.22–5.81)
RDW: 12.8 % (ref 11.5–15.5)
WBC: 8.7 10*3/uL (ref 4.0–10.5)

## 2011-12-23 LAB — PROTIME-INR
INR: 1.04 (ref 0.00–1.49)
Prothrombin Time: 13.8 seconds (ref 11.6–15.2)

## 2011-12-23 MED ORDER — METOPROLOL TARTRATE 25 MG PO TABS
25.0000 mg | ORAL_TABLET | Freq: Three times a day (TID) | ORAL | Status: DC
  Administered 2011-12-23 – 2011-12-27 (×9): 25 mg via ORAL
  Filled 2011-12-23 (×15): qty 1

## 2011-12-23 MED ORDER — INSULIN GLARGINE 100 UNIT/ML ~~LOC~~ SOLN
15.0000 [IU] | Freq: Every day | SUBCUTANEOUS | Status: DC
  Administered 2011-12-23 – 2011-12-25 (×3): 15 [IU] via SUBCUTANEOUS

## 2011-12-23 MED ORDER — INSULIN GLARGINE 100 UNIT/ML ~~LOC~~ SOLN: 15.0000 [IU] | SUBCUTANEOUS | Status: DC

## 2011-12-23 MED ORDER — AMIODARONE HCL IN DEXTROSE 360-4.14 MG/200ML-% IV SOLN
0.5000 mg/min | INTRAVENOUS | Status: DC
  Administered 2011-12-24 (×2): 0.5 mg/min via INTRAVENOUS
  Filled 2011-12-23 (×7): qty 200

## 2011-12-23 MED ORDER — METOPROLOL TARTRATE 25 MG PO TABS
25.0000 mg | ORAL_TABLET | Freq: Two times a day (BID) | ORAL | Status: DC
  Filled 2011-12-23 (×2): qty 1

## 2011-12-23 MED ORDER — METOPROLOL TARTRATE 1 MG/ML IV SOLN
2.5000 mg | INTRAVENOUS | Status: DC | PRN
  Administered 2011-12-23 – 2011-12-24 (×3): 2.5 mg via INTRAVENOUS
  Filled 2011-12-23 (×3): qty 5

## 2011-12-23 MED ORDER — AMIODARONE HCL IN DEXTROSE 360-4.14 MG/200ML-% IV SOLN
1.0000 mg/min | INTRAVENOUS | Status: AC
  Administered 2011-12-23: 1 mg/min via INTRAVENOUS
  Filled 2011-12-23: qty 200

## 2011-12-23 MED ORDER — WARFARIN SODIUM 2.5 MG PO TABS
2.5000 mg | ORAL_TABLET | Freq: Once | ORAL | Status: AC
  Administered 2011-12-23: 2.5 mg via ORAL
  Filled 2011-12-23: qty 1

## 2011-12-23 NOTE — Progress Notes (Signed)
Monitor tech informed the nurse that the patient was in rapid a-fib with a HR 180. Nurse checked on the patient and he informed the nurse that he could feel his heart beating very fast. Nurse paged Dr. Laneta Simmers and he instructed the nurse to initiate the amiodarone protocol. Nurse initiated as was instructed. Patient is on an amiodarone drip running at 33.3 ml/hr. Harmon Pier

## 2011-12-23 NOTE — Progress Notes (Signed)
[facility]  Subjective: His chest is sore and he can feel his HR is elevated.  Objective: Vital signs in last 24 hours: Temp:  [97.7 F (36.5 C)-98.4 F (36.9 C)] 97.8 F (36.6 C) (06/01 0418) Pulse Rate:  [80-97] 84  (06/01 0418) Resp:  [11-20] 18  (06/01 0418) BP: (102-140)/(51-75) 112/66 mmHg (06/01 0418) SpO2:  [93 %-98 %] 94 % (06/01 0418) Weight:  [96.1 kg (211 lb 13.8 oz)] 96.1 kg (211 lb 13.8 oz) (06/01 0418) Last BM Date: 12/19/11  Intake/Output from previous day: 05/31 0701 - 06/01 0700 In: 406 [P.O.:360; I.V.:46] Out: 1660 [Urine:1560; Chest Tube:100] Intake/Output this shift:    Medications Current Facility-Administered Medications  Medication Dose Route Frequency Provider Last Rate Last Dose  . 0.9 %  sodium chloride infusion  250 mL Intravenous PRN [name], MD      . acetaminophen (TYLENOL) tablet 650 mg  650 mg Oral Q6H PRN [name], MD      . amiodarone (NEXTERONE PREMIX) 360 mg/200 mL dextrose IV infusion  1 mg/min Intravenous Continuous Alleen Borne, MD 33.3 mL/hr at 12/23/11 0504 1 mg/min at 12/23/11 0504   Followed by  . amiodarone (NEXTERONE PREMIX) 360 mg/200 mL dextrose IV infusion  0.5 mg/min Intravenous Continuous Alleen Borne, MD      . aspirin EC tablet 81 mg  81 mg Oral Daily [name], MD   81 mg at 12/22/11 1229  . atorvastatin (LIPITOR) tablet 40 mg  40 mg Oral q1800 Marykay Lex, MD   40 mg at 12/22/11 2159  . bisacodyl (DULCOLAX) EC tablet 10 mg  10 mg Oral Daily PRN [name], MD   10 mg at 12/22/11 1230   Or  . bisacodyl (DULCOLAX) suppository 10 mg  10 mg Rectal Daily PRN [name], MD      . docusate sodium (COLACE) capsule 200 mg  200 mg Oral Daily Erin R Barrett, PA   200 mg at 12/22/11 1228  . docusate sodium (COLACE) capsule 200 mg  200 mg Oral Daily [name], MD      . furosemide (LASIX) tablet 40 mg  40 mg Oral Daily [name], MD   40  mg at 12/22/11 1230  . insulin aspart (novoLOG) injection 0-24 Units  0-24 Units Subcutaneous TID AC & HS [name], MD      . insulin glargine (LANTUS) injection 15 Units  15 Units Subcutaneous Daily [name], MD      . lisinopril (PRINIVIL,ZESTRIL) tablet 2.5 mg  2.5 mg Oral Daily [name], MD   2.5 mg at 12/22/11 1231  . metoprolol tartrate (LOPRESSOR) tablet 25 mg  25 mg Oral BID Wilmon Pali, PA      . moving right along book   Does not apply Once [name], MD      . ondansetron [facility]) tablet 4 mg  4 mg Oral Q6H PRN [name], MD       Or  . ondansetron [facility]) injection 4 mg  4 mg Intravenous Q6H PRN [name], MD      . oxyCODONE (Oxy IR/ROXICODONE) immediate release tablet 5-10 mg  5-10 mg Oral Q3H PRN Rae Roam Barrett, PA   10 mg at 12/23/11 0428  . oxyCODONE (Oxy IR/ROXICODONE) immediate release tablet 5-10 mg  5-10 mg Oral Q3H PRN [name], MD      .  pantoprazole (PROTONIX) EC tablet 40 mg  40 mg Oral QAC breakfast [name], MD   40 mg at 12/23/11 0857  . potassium chloride SA (K-DUR,KLOR-CON) CR tablet 20 mEq  20 mEq Oral Daily [name], MD   20 mEq at 12/22/11 1231  . sodium chloride 0.9 % injection 3 mL  3 mL Intravenous Q12H [name], MD   3 mL at 12/22/11 2203  . sodium chloride 0.9 % injection 3 mL  3 mL Intravenous PRN [name], MD      . traMADol Janean Sark) tablet 50 mg  50 mg Oral Q6H PRN [name], MD   50 mg at 12/22/11 0805  . traMADol (ULTRAM) tablet 50-100 mg  50-100 mg Oral Q4H PRN [name], MD      . warfarin (COUMADIN) tablet 2.5 mg  2.5 mg Oral ONCE-1800 [name], MD   2.5 mg at 12/22/11 2201  . warfarin (COUMADIN) tablet 2.5 mg  2.5 mg Oral ONCE-1800 Wilmon Pali, PA      . Warfarin - Physician Dosing Inpatient   Does not apply q1800 Fredrik Rigger, PHARMD      . DISCONTD: insulin aspart (novoLOG) injection 0-24 Units  0-24 Units  Subcutaneous Q4H [name], MD   2 Units at 12/22/11 1640  . DISCONTD: insulin glargine (LANTUS) injection 15 Units  15 Units Subcutaneous BH-q7a Wilmon Pali, PA      . DISCONTD: insulin glargine (LANTUS) injection 20 Units  20 Units Subcutaneous BH-q7a [name], MD   20 Units at 12/23/11 [phone]  . DISCONTD: metoprolol tartrate (LOPRESSOR) 25 mg/10 mL oral suspension 12.5 mg  12.5 mg Per Tube BID Erin R Barrett, PA      . DISCONTD: metoprolol tartrate (LOPRESSOR) tablet 12.5 mg  12.5 mg Oral BID Erin R Barrett, PA      . DISCONTD: metoprolol tartrate (LOPRESSOR) tablet 12.5 mg  12.5 mg Oral BID [name], MD   12.5 mg at 12/22/11 2247    PE: General appearance: alert, cooperative and no distress Lungs: Bilateral basilar rales. Heart: Reg rhythm.  Tachy. no MM. Abdomen: +BS, nontender Extremities: 2+ LEE Pulses: 2+ and symmetric 1+ DP pulses Skin: wam and dry  Lab Results:   Basename 12/23/11 0615 12/22/11 0421 12/21/11 1709 12/21/11 1648  WBC 8.7 8.5 -- 13.0*  HGB 10.7* 10.8* 11.2* --  HCT 31.7* 30.8* 33.0* --  PLT 83* 71* -- 94*   BMET  Basename 12/23/11 0615 12/22/11 0421 12/21/11 1709 12/21/11 0400  NA 135 134* 136 --  K 3.7 3.9 4.2 --  CL 98 102 104 --  CO2 26 24 -- 22  GLUCOSE 126* 124* 149* --  BUN 24* 20 19 --  CREATININE 0.91 0.80 0.90 --  CALCIUM 8.7 8.4 -- 7.8*   PT/INR  Basename 12/23/11 0615 12/20/11 1725  LABPROT 13.8 19.1*  INR 1.04 1.57*    Assessment/Plan   Principal Problem:  *Unstable angina Active Problems:  HYPERLIPIDEMIA  HYPERTENSION  History of prior pulm embolism  CAD (coronary artery disease) with history of LAD & Diag athrectomy in 1994, now with severe 3 vesseal CAD by cath 12/14/11 for CABG  AS (aortic stenosis) moderate to severe by cath 12/14/11 for AVR   Chronic anticoagulation, on coumadin for pul emboli  Carotid artery disease, bilateral, asymptomatic  COPD by CXR  S/P CABG x 3: LIMA to LAD, SVG to PD,  SVG to  OM.   Plan: POD # 3. CABG x3: LIMA to LAD, SVG to PD, SVG to OM and aortic valve replacement with an [employer] 25mm Pericardial Tissue Valve . ASA/ lipitor/ lasix/ lisinopril/lopressor/coumadin/K+.  Sinus tach/Afib RVR on monitor.  IV Amiodarone started.  Recommend increasing lopressor to TID.    LOS: 9 days    HAGER, BRYAN 12/23/2011 10:17 AM   Patient seen and examined. Agree with assessment and plan.  AF now with increased rate in 130's.  Will give IV lopressor now and increase beta blocker; pt on amiodarone.  Lennette Bihari, MD, [facility] 12/23/2011 11:08 AM     Patient seen and examined. Agree with assessment and plan.   Lennette Bihari, MD, [facility] 12/23/2011 11:05 AM

## 2011-12-23 NOTE — Progress Notes (Signed)
MEDICATION RELATED CONSULT NOTE - INITIAL   Pharmacy Consult for Drug - drug interactions with amiodarone   Assessment: 24 YOM s/p CABG and AVR, with post - op Afib and started amiodarone gtt. Pharmacy is consulted for screening for drug-drug interactions. Pt. Is also on coumadin (per MD to dose), and lipitor 40mg  daily. Amiodarone may inhibits the metabolism of these two drugs. INR is 1.57   Plan:  - Recommend switching lipitor 40mg  to pravastatin 80mg  if amiodarone is to be continued for long term. - Pharmacy will following peripherally for daily INR and coumadin dosing.  Bayard Hugger, PharmD, BCPS  Clinical Pharmacist  Pager: [phone]  12/23/2011,5:33 AM

## 2011-12-23 NOTE — Progress Notes (Addendum)
                    [address]            [place] [address]          [phone]     3 Days Post-Op Procedure(s) (LRB): CORONARY ARTERY BYPASS GRAFTING (CABG) (N/A) AORTIC VALVE REPLACEMENT (AVR) (N/A) CYSTOSCOPY (N/A)  Subjective: Developed rapid AF this am, symptomatic.  Started on IV Amio.  Currently, feels better, HR still 130s.  Objective: Vital signs in last 24 hours: Patient Vitals for the past 24 hrs:  BP Temp Temp src Pulse Resp SpO2 Weight  12/23/11 0418 112/66 mmHg 97.8 F (36.6 C) Oral 84  18  94 % 211 lb 13.8 oz (96.1 kg)  12/22/11 2247 134/75 mmHg - - 92  - - -  12/22/11 1946 125/66 mmHg 98.4 F (36.9 C) Oral 97  18  96 % -  12/22/11 1705 130/57 mmHg - - 92  18  93 % -  12/22/11 1600 111/60 mmHg - - 84  20  98 % -  12/22/11 1527 - 97.7 F (36.5 C) Oral - - - -  12/22/11 1500 102/51 mmHg - - 80  12  96 % -  12/22/11 1400 107/61 mmHg - - 84  18  96 % -  12/22/11 1300 116/60 mmHg - - 90  11  95 % -  12/22/11 1231 140/65 mmHg - - - - - -  12/22/11 1230 140/65 mmHg - - 97  - - -  12/22/11 1200 - - - 96  16  95 % -  12/22/11 1151 - 98.1 F (36.7 C) Oral - - - -  12/22/11 1100 122/75 mmHg - - 95  18  94 % -  12/22/11 1000 151/69 mmHg - - 101  29  97 % -   Current Weight  12/23/11 211 lb 13.8 oz (96.1 kg)  Pre-op wt= 93.4 kg   Intake/Output from previous day: 05/31 0701 - 06/01 0700 In: 406 [P.O.:360; I.V.:46] Out: 1660 [Urine:1560; Chest Tube:100]  CBGs 128-106-89-97-126  PHYSICAL EXAM:  Heart: tachy, irr irr Lungs: few basilar crackles on L Wound: clean and dry Extremities: mild LE edema   Lab Results: CBC: Basename 12/23/11 0615 12/22/11 0421  WBC 8.7 8.5  HGB 10.7* 10.8*  HCT 31.7* 30.8*  PLT 83* 71*   BMET:  Basename 12/23/11 0615 12/22/11 0421  NA 135 134*  K 3.7 3.9  CL 98 102  CO2 26 24  GLUCOSE 126* 124*  BUN 24* 20  CREATININE 0.91 0.80  CALCIUM 8.7 8.4    PT/INR:  Basename 12/23/11 0615  LABPROT 13.8    INR 1.04   CXR not done this am due to AF  Assessment/Plan: S/P Procedure(s) (LRB): CORONARY ARTERY BYPASS GRAFTING (CABG) (N/A) AORTIC VALVE REPLACEMENT (AVR) (N/A) CYSTOSCOPY (N/A) CV- AF on Amio. Will increase beta blocker.  Continue ACE-I and watch BP. GU- bladder outlet obstruction, urology inserted Foley in OR.  Foley d/c'ed this am.  Will allow voiding trial and hopefully will not have to replace. Vol overload- diurese. CBGs ok, no h/o DM. A1C=6.2.  Will decrease Lantus and monitor. Coumadin restarted for h/o PE. Mobilize as tolerated.   LOS: 9 days    COLLINS,GINA H 12/23/2011    Chart reviewed, patient examined, agree with above.

## 2011-12-24 LAB — PROTIME-INR
INR: 1.22 (ref 0.00–1.49)
Prothrombin Time: 15.7 seconds — ABNORMAL HIGH (ref 11.6–15.2)

## 2011-12-24 LAB — GLUCOSE, CAPILLARY
Glucose-Capillary: 111 mg/dL — ABNORMAL HIGH (ref 70–99)
Glucose-Capillary: 102 mg/dL — ABNORMAL HIGH (ref 70–99)
Glucose-Capillary: 123 mg/dL — ABNORMAL HIGH (ref 70–99)

## 2011-12-24 MED ORDER — AMIODARONE HCL 150 MG/3ML IV SOLN
150.0000 mg | Freq: Once | INTRAVENOUS | Status: AC
  Administered 2011-12-24: 150 mg via INTRAVENOUS

## 2011-12-24 MED ORDER — WARFARIN SODIUM 2.5 MG PO TABS
2.5000 mg | ORAL_TABLET | Freq: Once | ORAL | Status: AC
  Administered 2011-12-24: 2.5 mg via ORAL
  Filled 2011-12-24: qty 1

## 2011-12-24 MED ORDER — AMIODARONE HCL 200 MG PO TABS
400.0000 mg | ORAL_TABLET | Freq: Two times a day (BID) | ORAL | Status: DC
  Administered 2011-12-24 – 2011-12-27 (×6): 400 mg via ORAL
  Filled 2011-12-24 (×8): qty 2

## 2011-12-24 NOTE — Progress Notes (Signed)
[facility]  Subjective: Hallucinations last night.  He does not feel his rapid HR today.  Some chest discomfort.  Objective: Vital signs in last 24 hours: Temp:  [97 F (36.1 C)-98.8 F (37.1 C)] 98.8 F (37.1 C) (06/02 0531) Pulse Rate:  [133-138] 136  (06/02 0942) Resp:  [18-20] 20  (06/02 0531) BP: (92-116)/(50-83) 92/57 mmHg (06/02 0942) SpO2:  [96 %-97 %] 97 % (06/02 0531) Weight:  [97.8 kg (215 lb 9.8 oz)] 97.8 kg (215 lb 9.8 oz) (06/02 0531) Last BM Date: 12/23/11  Intake/Output from previous day: 06/01 0701 - 06/02 0700 In: 1032.3 [P.O.:720; I.V.:312.3] Out: 1450 [Urine:1450] Intake/Output this shift: Total I/O In: 240 [P.O.:240] Out: -   Medications Current Facility-Administered Medications  Medication Dose Route Frequency Provider Last Rate Last Dose  . 0.9 %  sodium chloride infusion  250 mL Intravenous PRN [name], MD      . acetaminophen (TYLENOL) tablet 650 mg  650 mg Oral Q6H PRN [name], MD   650 mg at 12/24/11 0748  . amiodarone (CORDARONE) 150 mg in dextrose 5 % 100 mL bolus  150 mg Intravenous Once Wilmon Pali, PA   150 mg at 12/24/11 0937  . amiodarone (NEXTERONE PREMIX) 360 mg/200 mL dextrose IV infusion  1 mg/min Intravenous Continuous Alleen Borne, MD 33.3 mL/hr at 12/23/11 0504 1 mg/min at 12/23/11 0504   Followed by  . amiodarone (NEXTERONE PREMIX) 360 mg/200 mL dextrose IV infusion  0.5 mg/min Intravenous Continuous Alleen Borne, MD 16.7 mL/hr at 12/24/11 0133 0.5 mg/min at 12/24/11 0133  . aspirin EC tablet 81 mg  81 mg Oral Daily [name], MD   81 mg at 12/24/11 0942  . atorvastatin (LIPITOR) tablet 40 mg  40 mg Oral q1800 Marykay Lex, MD   40 mg at 12/23/11 1756  . bisacodyl (DULCOLAX) EC tablet 10 mg  10 mg Oral Daily PRN [name], MD   10 mg at 12/22/11 1230   Or  . bisacodyl (DULCOLAX) suppository 10 mg  10 mg Rectal Daily PRN [name], MD      . docusate  sodium (COLACE) capsule 200 mg  200 mg Oral Daily Erin R Barrett, PA   200 mg at 12/23/11 1046  . docusate sodium (COLACE) capsule 200 mg  200 mg Oral Daily [name], MD   200 mg at 12/23/11 1054  . furosemide (LASIX) tablet 40 mg  40 mg Oral Daily [name], MD   40 mg at 12/24/11 0942  . insulin aspart (novoLOG) injection 0-24 Units  0-24 Units Subcutaneous TID AC & HS [name], MD   2 Units at 12/23/11 2109  . insulin glargine (LANTUS) injection 15 Units  15 Units Subcutaneous Daily [name], MD   15 Units at 12/24/11 [phone]  . lisinopril (PRINIVIL,ZESTRIL) tablet 2.5 mg  2.5 mg Oral Daily [name], MD   2.5 mg at 12/24/11 0942  . metoprolol (LOPRESSOR) injection 2.5 mg  2.5 mg Intravenous Q5 min PRN Lennette Bihari, MD   2.5 mg at 12/24/11 0903  . metoprolol tartrate (LOPRESSOR) tablet 25 mg  25 mg Oral TID Lennette Bihari, MD   25 mg at 12/23/11 2108  . ondansetron (ZOFRAN) tablet 4 mg  4 mg Oral Q6H PRN [name], MD       Or  . ondansetron [facility]) injection 4 mg  4 mg Intravenous Q6H PRN [name], MD      . pantoprazole (PROTONIX) EC tablet 40 mg  40 mg Oral QAC breakfast [name], MD   40 mg at 12/24/11 0748  . potassium chloride SA (K-DUR,KLOR-CON) CR tablet 20 mEq  20 mEq Oral Daily [name], MD   20 mEq at 12/24/11 0942  . sodium chloride 0.9 % injection 3 mL  3 mL Intravenous Q12H [name], MD   3 mL at 12/23/11 1055  . sodium chloride 0.9 % injection 3 mL  3 mL Intravenous PRN [name], MD      . traMADol Janean Sark) tablet 50 mg  50 mg Oral Q6H PRN [name], MD   50 mg at 12/22/11 0805  . traMADol (ULTRAM) tablet 50-100 mg  50-100 mg Oral Q4H PRN [name], MD      . warfarin (COUMADIN) tablet 2.5 mg  2.5 mg Oral ONCE-1800 Wilmon Pali, PA   2.5 mg at 12/23/11 1756  . warfarin (COUMADIN) tablet 2.5 mg  2.5 mg Oral ONCE-1800 Wilmon Pali, PA      . Warfarin - Physician  Dosing Inpatient   Does not apply q1800 Fredrik Rigger, [place]      . DISCONTD: metoprolol tartrate (LOPRESSOR) tablet 25 mg  25 mg Oral BID Wilmon Pali, PA      . DISCONTD: oxyCODONE (Oxy IR/ROXICODONE) immediate release tablet 5-10 mg  5-10 mg Oral Q3H PRN Erin R Barrett, PA   5 mg at 12/23/11 1046  . DISCONTD: oxyCODONE (Oxy IR/ROXICODONE) immediate release tablet 5-10 mg  5-10 mg Oral Q3H PRN [name], MD        PE: General appearance: alert, cooperative and no distress Lungs: Mild basilar rales Heart: Reg rhythm and elevated rate.  No MM Extremities: 1+ left LEE, trace on the right. Pulses: 2+ and symmetric  Lab Results:   Basename 12/23/11 0615 12/22/11 0421 12/21/11 1709 12/21/11 1648  WBC 8.7 8.5 -- 13.0*  HGB 10.7* 10.8* 11.2* --  HCT 31.7* 30.8* 33.0* --  PLT 83* 71* -- 94*   BMET  Basename 12/23/11 0615 12/22/11 0421 12/21/11 1709  NA 135 134* 136  K 3.7 3.9 4.2  CL 98 102 104  CO2 26 24 --  GLUCOSE 126* 124* 149*  BUN 24* 20 19  CREATININE 0.91 0.80 0.90  CALCIUM 8.7 8.4 --   PT/INR  Basename 12/24/11 0520 12/23/11 0615  LABPROT 15.7* 13.8  INR 1.22 1.04     Assessment/Plan  Principal Problem:  *Unstable angina Active Problems:  HYPERLIPIDEMIA  HYPERTENSION  History of prior pulm embolism  CAD (coronary artery disease) with history of LAD & Diag athrectomy in 1994, now with severe 3 vesseal CAD by cath 12/14/11 for CABG  AS (aortic stenosis) moderate to severe by cath 12/14/11 for AVR   Chronic anticoagulation, on coumadin for pul emboli  Carotid artery disease, bilateral, asymptomatic  COPD by CXR  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   Plan:  Aflutter with RVR.  130-140.  Extra bolus of Amiodarone being given.  If no response, I agree with starting IV diltiazem.  Increased lopressor does not appear to be helping.  BP soft, 92/57.     LOS: 10 days    HAGER, BRYAN 12/24/2011 10:46 AM   Patient seen and examined.  Agree with assessment and plan. Day 4 post  op.Now getting extra amiodarone, in A Flutter at 140. Confusion last night probably contributed by oxycodone.   Lennette Bihari, MD, [facility] 12/24/2011 11:09 AM

## 2011-12-24 NOTE — Progress Notes (Addendum)
                    [address]            [place] [address]          [phone]     4 Days Post-Op Procedure(s) (LRB): CORONARY ARTERY BYPASS GRAFTING (CABG) (N/A) AORTIC VALVE REPLACEMENT (AVR) (N/A) CYSTOSCOPY (N/A)  Subjective: C/O confusion and hallucinations after pain Rx last night.  In AF with elevated rates again this am despite IV Amio, po and IV Lopressor.  Objective: Vital signs in last 24 hours: Patient Vitals for the past 24 hrs:  BP Temp Temp src Pulse Resp SpO2 Weight  12/24/11 0531 104/68 mmHg 98.8 F (37.1 C) Oral 133  20  97 % 215 lb 9.8 oz (97.8 kg)  12/23/11 2223 116/83 mmHg - - 134  - - -  12/23/11 2050 104/65 mmHg 97 F (36.1 C) Oral 135  19  97 % -  12/23/11 1649 103/60 mmHg - - 137  - - -  12/23/11 1404 96/50 mmHg 98.4 F (36.9 C) - 138  18  96 % -  12/23/11 1045 104/66 mmHg - - 136  - - -   Current Weight  12/24/11 215 lb 9.8 oz (97.8 kg)  Pre-op wt= 93.4 kg    Intake/Output from previous day: 06/01 0701 - 06/02 0700 In: 1032.3 [P.O.:720; I.V.:312.3] Out: 1450 [Urine:1450]  CBGs 155-194-147-111  PHYSICAL EXAM:  Heart: Tachy, irr irr Lungs: clear Wound: clean and dry Extremities: mild LE edema  Lab Results: CBC: Basename 12/23/11 0615 12/22/11 0421  WBC 8.7 8.5  HGB 10.7* 10.8*  HCT 31.7* 30.8*  PLT 83* 71*   BMET:  Basename 12/23/11 0615 12/22/11 0421  NA 135 134*  K 3.7 3.9  CL 98 102  CO2 26 24  GLUCOSE 126* 124*  BUN 24* 20  CREATININE 0.91 0.80  CALCIUM 8.7 8.4    PT/INR:  Basename 12/24/11 0520  LABPROT 15.7*  INR 1.22     Assessment/Plan: S/P Procedure(s) (LRB): CORONARY ARTERY BYPASS GRAFTING (CABG) (N/A) AORTIC VALVE REPLACEMENT (AVR) (N/A) CYSTOSCOPY (N/A)  CV- AF, on IV Amio and rates back up 120-130s this am. Has had IV Lopressor without changes. Po Lopressor increased yesterday.  BPs 90-110s. Will give an additional bolus of Amio, consider IV Cardizem if HR remains elevated and  BPs tolerate. Cardiology following.  GU- Voiding without difficulty.   Vol overload- diurese.   CBGs ok, no h/o DM. A1C=6.2. Will continue Lantus and monitor.   Coumadin restarted for h/o PE.   Will d/c Oxycodone and watch.    LOS: 10 days    COLLINS,GINA H 12/24/2011    Chart reviewed, patient examined, agree with above. Need to switch amio to po with peripheral IV before he gets phlebitis. His BP is borderline and he may be better off with digoxin for additional rate control rather than cardizem or more B-Blocker.

## 2011-12-25 LAB — BASIC METABOLIC PANEL
GFR calc Af Amer: 76 mL/min — ABNORMAL LOW (ref >90)
GFR calc non Af Amer: 66 mL/min — ABNORMAL LOW (ref >90)
Potassium: 4 mEq/L (ref 3.5–5.1)
Sodium: 136 mEq/L (ref 135–145)

## 2011-12-25 LAB — GLUCOSE, CAPILLARY
Glucose-Capillary: 80 mg/dL (ref 70–99)
Glucose-Capillary: 135 mg/dL — ABNORMAL HIGH (ref 70–99)

## 2011-12-25 LAB — CBC
Hemoglobin: 10.1 g/dL — ABNORMAL LOW (ref 13.0–17.0)
RBC: 3.16 MIL/uL — ABNORMAL LOW (ref 4.22–5.81)

## 2011-12-25 LAB — PROTIME-INR
INR: 1.04 (ref 0.00–1.49)
Prothrombin Time: 13.8 seconds (ref 11.6–15.2)

## 2011-12-25 MED ORDER — WARFARIN SODIUM 5 MG PO TABS
5.0000 mg | ORAL_TABLET | Freq: Once | ORAL | Status: AC
  Administered 2011-12-25: 5 mg via ORAL
  Filled 2011-12-25: qty 1

## 2011-12-25 NOTE — Progress Notes (Signed)
CARDIAC REHAB PHASE I   PRE:  Rate/Rhythm: 89SR  BP:  Supine:   Sitting: 115/53  Standing:    SaO2: 97%2L, 96%RA  MODE:  Ambulation: 550 ft   POST:  Rate/Rhythem: 111ST  BP:  Supine:   Sitting: 139/65  Standing:    SaO2: 95-97%RA [id number] Pt walked 550 ft on RA with rolling walker and minimal asst.  Checked sats frequently during walk and maintained 95-97%RA. Remained in NSR.  Tolerated well. To recliner after walk. Left off oxygen and notified RN.  Duanne Limerick

## 2011-12-25 NOTE — Progress Notes (Signed)
Pt ambulated 560ft with no walker on room air without stopping. Pt stated "I feel a little short of breath, but not bad.  Pt SaO2 95%, Hr <117 while ambulating. Pt refused to stop to rest. Pt returned to his room. Will continue to monitor

## 2011-12-25 NOTE — Progress Notes (Signed)
Pt ambulated 550 feet in hallway with RN without difficulty; will cont. To monitor.

## 2011-12-25 NOTE — Progress Notes (Signed)
Utilization review complete.   Jamia Hoban Wise Konnor Jorden, RN, BSN Phone #[phone] 

## 2011-12-25 NOTE — Progress Notes (Signed)
[facility]  Subjective: Feeling better   Objective: Vital signs in last 24 hours: Temp:  [98.1 F (36.7 C)-99.1 F (37.3 C)] 98.5 F (36.9 C) (06/03 0526) Pulse Rate:  [82-139] 82  (06/03 0526) Resp:  [18-21] 21  (06/03 0526) BP: (94-118)/(66-71) 118/69 mmHg (06/03 0526) SpO2:  [96 %-98 %] 96 % (06/03 0526) Weight:  [95.029 kg (209 lb 8 oz)] 95.029 kg (209 lb 8 oz) (06/03 0526) Last BM Date: 12/24/11  Intake/Output from previous day: 06/02 0701 - 06/03 0700 In: 1946.2 [P.O.:476; I.V.:1470.2] Out: 900 [Urine:900] Intake/Output this shift:    Medications Current Facility-Administered Medications  Medication Dose Route Frequency Provider Last Rate Last Dose  . 0.9 %  sodium chloride infusion  250 mL Intravenous PRN [name], MD      . acetaminophen (TYLENOL) tablet 650 mg  650 mg Oral Q6H PRN [name], MD   650 mg at 12/25/11 0432  . amiodarone (PACERONE) tablet 400 mg  400 mg Oral BID Alleen Borne, MD   400 mg at 12/25/11 1002  . aspirin EC tablet 81 mg  81 mg Oral Daily [name], MD   81 mg at 12/25/11 1001  . atorvastatin (LIPITOR) tablet 40 mg  40 mg Oral q1800 Marykay Lex, MD   40 mg at 12/24/11 1702  . bisacodyl (DULCOLAX) EC tablet 10 mg  10 mg Oral Daily PRN [name], MD   10 mg at 12/22/11 1230   Or  . bisacodyl (DULCOLAX) suppository 10 mg  10 mg Rectal Daily PRN [name], MD      . docusate sodium (COLACE) capsule 200 mg  200 mg Oral Daily Erin R Barrett, PA   200 mg at 12/25/11 1001  . docusate sodium (COLACE) capsule 200 mg  200 mg Oral Daily [name], MD   200 mg at 12/23/11 1054  . insulin aspart (novoLOG) injection 0-24 Units  0-24 Units Subcutaneous TID AC & HS [name], MD   2 Units at 12/24/11 2133  . insulin glargine (LANTUS) injection 15 Units  15 Units Subcutaneous Daily [name], MD   15 Units at 12/25/11 1000  . lisinopril (PRINIVIL,ZESTRIL)  tablet 2.5 mg  2.5 mg Oral Daily [name], MD   2.5 mg at 12/25/11 1002  . metoprolol (LOPRESSOR) injection 2.5 mg  2.5 mg Intravenous Q5 min PRN Lennette Bihari, MD   2.5 mg at 12/24/11 0903  . metoprolol tartrate (LOPRESSOR) tablet 25 mg  25 mg Oral TID Lennette Bihari, MD   25 mg at 12/25/11 1002  . ondansetron (ZOFRAN) tablet 4 mg  4 mg Oral Q6H PRN [name], MD       Or  . ondansetron [facility]) injection 4 mg  4 mg Intravenous Q6H PRN [name], MD      . pantoprazole (PROTONIX) EC tablet 40 mg  40 mg Oral QAC breakfast [name], MD   40 mg at 12/25/11 1610  . potassium chloride SA (K-DUR,KLOR-CON) CR tablet 20 mEq  20 mEq Oral Daily [name], MD   20 mEq at 12/25/11 1001  . sodium chloride 0.9 % injection 3 mL  3 mL Intravenous Q12H [name], MD   3 mL at 12/25/11 1001  . sodium chloride 0.9 % injection 3 mL  3 mL Intravenous PRN [name], MD      .  traMADol (ULTRAM) tablet 50 mg  50 mg Oral Q6H PRN [name], MD   50 mg at 12/22/11 0805  . traMADol (ULTRAM) tablet 50-100 mg  50-100 mg Oral Q4H PRN [name], MD      . warfarin (COUMADIN) tablet 2.5 mg  2.5 mg Oral ONCE-1800 Wilmon Pali, PA   2.5 mg at 12/24/11 1702  . Warfarin - Physician Dosing Inpatient   Does not apply q1800 Fredrik Rigger, PHARMD      . DISCONTD: amiodarone (NEXTERONE PREMIX) 360 mg/200 mL dextrose IV infusion  0.5 mg/min Intravenous Continuous Alleen Borne, MD 16.7 mL/hr at 12/24/11 1127 0.5 mg/min at 12/24/11 1127    PE: General appearance: alert, cooperative and no distress Lungs: Basilar rales.  No wheeze Heart: regular rate and rhythm, no MM Extremities: 1+ LEE Pulses: Radials 2+ and symmetric  Lab Results:   Basename 12/25/11 0535 12/23/11 0615  WBC 5.3 8.7  HGB 10.1* 10.7*  HCT 29.7* 31.7*  PLT 115* 83*   BMET  Basename 12/25/11 0535 12/23/11 0615  NA 136 135  K 4.0 3.7  CL 100 98  CO2 29 26  GLUCOSE 95 126*    BUN 23 24*  CREATININE 1.06 0.91  CALCIUM 8.8 8.7   PT/INR  Basename 12/25/11 0535 12/24/11 0520 12/23/11 0615  LABPROT 13.8 15.7* 13.8  INR 1.04 1.22 1.04    Assessment/Plan  Principal Problem:  *Unstable angina Active Problems:  HYPERLIPIDEMIA  HYPERTENSION  History of prior pulm embolism  CAD (coronary artery disease) with history of LAD & Diag athrectomy in 1994, now with severe 3 vesseal CAD by cath 12/14/11 for CABG  AS (aortic stenosis) moderate to severe by cath 12/14/11 for AVR   Chronic anticoagulation, on coumadin for pul emboli  Carotid artery disease, bilateral, asymptomatic  COPD by CXR  S/P CABG x 3: LIMA to LAD, SVG to PD, SVG to OM.   Plan: S/P CABG x 3: LIMA to LAD, SVG to PD, SVG to OM.  AVR.  ALutter RVR now maintaining NSR in the 90's . On Amio.  Extra bolus yesterday seemed to help. BP controlled.  INR subtherapeutic - adjusting warfarin per Ph[facility]ist assistance.   LOS: 11 days    Curtis Brennan, Curtis Brennan 12/25/2011 10:20 AM  ATTENDING ATTESTATION:  I have seen and examined the patient along with Wilburt Finlay, PA.  I have reviewed the chart, notes and new data.  I agree with Judie Grieve Curtis Brennan's note.  Brief Description: 69 y/p POD#5 s/p CABG x 3 (LIMA-LAD, SVG-PDA, SVG-OM) & AVR - post-op Afib/Flutter, now on Amiodarone --> appears to be in NSR for now.  Was troubled with delirium & hallucinations (? Narcotic related) post-op ,seems to have resolved,  A/C started for Afib/Flutter prophylaxis. BP stable on low dose BB & ACE-I. On statin.  Ambulating in hall this PM - doing well.  PLAN:  Continue with current BB & ACE-I dose along with amiodarone load; can be adjusted in OP setting after full ~10 g load complete.  400 mg bid x 1 week -> 200 mg bid (or 400mg  daily) x 1 week before decreasing to maintenance 200mg  daily  May be close to d/c -- will assist with arranging ROV with Dr. Allyson Sabal.  Marykay Lex, M.D., M.S. [facility] [address] [place], [place]  [address]  [phone]  12/25/2011 3:45 PM

## 2011-12-25 NOTE — Progress Notes (Addendum)
[address]            [place] [address]          [phone]     5 Days Post-Op  Procedure(s) (LRB): CORONARY ARTERY BYPASS GRAFTING (CABG) (N/A) AORTIC VALVE REPLACEMENT (AVR) (N/A) CYSTOSCOPY (N/A) Subjective: Feels well overall  Objective  Telemetry some atrial fibrillation , currently in Sinus Rhythm  Temp:  [98.1 F (36.7 C)-99.1 F (37.3 C)] 98.5 F (36.9 C) (06/03 0526) Pulse Rate:  [82-139] 82  (06/03 0526) Resp:  [18-21] 21  (06/03 0526) BP: (92-118)/(57-71) 118/69 mmHg (06/03 0526) SpO2:  [96 %-98 %] 96 % (06/03 0526) Weight:  [209 lb 8 oz (95.029 kg)] 209 lb 8 oz (95.029 kg) (06/03 0526)   Intake/Output Summary (Last 24 hours) at 12/25/11 0743 Last data filed at 12/25/11 0257  Gross per 24 hour  Intake 1946.18 ml  Output    900 ml  Net 1046.18 ml       General appearance: alert, cooperative and no distress Heart: regular rate and rhythm and S1, S2 normal Lungs: clear to auscultation bilaterally Abdomen: soft, nontender Extremities: + BLE edema Wound: incis healing well  Lab Results:  Basename 12/25/11 0535 12/23/11 0615  NA 136 135  K 4.0 3.7  CL 100 98  CO2 29 26  GLUCOSE 95 126*  BUN 23 24*  CREATININE 1.06 0.91  CALCIUM 8.8 8.7  MG -- --  PHOS -- --   No results found for this basename: AST:2,ALT:2,ALKPHOS:2,BILITOT:2,PROT:2,ALBUMIN:2 in the last 72 hours No results found for this basename: LIPASE:2,AMYLASE:2 in the last 72 hours  Basename 12/25/11 0535 12/23/11 0615  WBC 5.3 8.7  NEUTROABS -- --  HGB 10.1* 10.7*  HCT 29.7* 31.7*  MCV 94.0 94.6  PLT 115* 83*   No results found for this basename: CKTOTAL:4,CKMB:4,TROPONINI:4 in the last 72 hours No components found with this basename: POCBNP:3 No results found for this basename: DDIMER in the last 72 hours No results found for this basename: HGBA1C in the last 72 hours No results found for this basename: CHOL,HDL,LDLCALC,TRIG,CHOLHDL in the  last 72 hours No results found for this basename: TSH,T4TOTAL,FREET3,T3FREE,THYROIDAB in the last 72 hours No results found for this basename: VITAMINB12,FOLATE,FERRITIN,TIBC,IRON,RETICCTPCT in the last 72 hours  Medications: Scheduled    . amiodarone (NEXTERONE) IV bolus only 150 mg/100 mL  150 mg Intravenous Once  . amiodarone  400 mg Oral BID  . aspirin EC  81 mg Oral Daily  . atorvastatin  40 mg Oral q1800  . docusate sodium  200 mg Oral Daily  . docusate sodium  200 mg Oral Daily  . furosemide  40 mg Oral Daily  . insulin aspart  0-24 Units Subcutaneous TID AC & HS  . insulin glargine  15 Units Subcutaneous Daily  . lisinopril  2.5 mg Oral Daily  . metoprolol tartrate  25 mg Oral TID  . pantoprazole  40 mg Oral QAC breakfast  . potassium chloride  20 mEq Oral Daily  . sodium chloride  3 mL Intravenous Q12H  . warfarin  2.5 mg Oral ONCE-1800  . Warfarin - Physician Dosing Inpatient   Does not apply q1800     Radiology/Studies:  Dg Chest 2 View  12/23/2011  *RADIOLOGY REPORT*  Clinical Data: Postop chest tube removal.  CHEST - 2 VIEW  Comparison: 12/21/2011  Findings: Left chest tube has  been removed.  Question tiny left lateral apical pneumothorax.  Changes of prior CABG and valve replacement.  Linear atelectasis and/or scarring in the lung bases. Small bilateral effusions.  Heart is mildly enlarged.  IMPRESSION: Interval removal of left chest tube.  Question tiny left apical pneumothorax.  Otherwise no change.  Original Report Authenticated By: Cyndie Chime, M.D.    INR:1.04 Will add last result for INR, ABG once components are confirmed Will add last 4 CBG results once components are confirmed  Assessment/Plan: S/P Procedure(s) (LRB): CORONARY ARTERY BYPASS GRAFTING (CABG) (N/A) AORTIC VALVE REPLACEMENT (AVR) (N/A) CYSTOSCOPY (N/A) Mobilize Diuresis Cont current afib management, on AC rx- INR low still H/H fairly stable CBG's good, d/c lantus at this time Cont  rehab Confusion resolved   LOS: 11 days    GOLD,WAYNE E 6/3/20137:43 AM    Patient seen and examined. Agree with above. In SR currently.  INR yet to bump.

## 2011-12-26 DIAGNOSIS — I200 Unstable angina: Secondary | ICD-10-CM | POA: Diagnosis present

## 2011-12-26 DIAGNOSIS — I480 Paroxysmal atrial fibrillation: Secondary | ICD-10-CM | POA: Diagnosis not present

## 2011-12-26 LAB — PROTIME-INR
INR: 1.29 (ref 0.00–1.49)
Prothrombin Time: 16.3 seconds — ABNORMAL HIGH (ref 11.6–15.2)

## 2011-12-26 MED ORDER — AMIODARONE HCL 400 MG PO TABS: 400.0000 mg | ORAL_TABLET | Freq: Two times a day (BID) | ORAL | Status: DC

## 2011-12-26 MED ORDER — ATORVASTATIN CALCIUM 40 MG PO TABS: 40.0000 mg | ORAL_TABLET | Freq: Every day | ORAL | Status: DC

## 2011-12-26 MED ORDER — AMIODARONE HCL 200 MG PO TABS: 400.0000 mg | ORAL_TABLET | Freq: Every day | ORAL | Status: DC

## 2011-12-26 MED ORDER — FUROSEMIDE 40 MG PO TABS
40.0000 mg | ORAL_TABLET | Freq: Every day | ORAL | Status: DC
  Administered 2011-12-26 – 2011-12-27 (×2): 40 mg via ORAL
  Filled 2011-12-26 (×2): qty 1

## 2011-12-26 MED ORDER — WARFARIN SODIUM 7.5 MG PO TABS
7.5000 mg | ORAL_TABLET | Freq: Once | ORAL | Status: AC
  Administered 2011-12-26: 7.5 mg via ORAL
  Filled 2011-12-26 (×2): qty 1

## 2011-12-26 MED ORDER — FUROSEMIDE 40 MG PO TABS: 40.0000 mg | ORAL_TABLET | Freq: Every day | ORAL | Status: DC

## 2011-12-26 MED ORDER — METOPROLOL TARTRATE 25 MG PO TABS: 25.0000 mg | ORAL_TABLET | Freq: Three times a day (TID) | ORAL | Status: DC

## 2011-12-26 MED ORDER — AMIODARONE HCL 200 MG PO TABS: 200.0000 mg | ORAL_TABLET | Freq: Every day | ORAL | Status: DC

## 2011-12-26 MED ORDER — TRAMADOL HCL 50 MG PO TABS: 50.0000 mg | ORAL_TABLET | Freq: Four times a day (QID) | ORAL | Status: DC | PRN

## 2011-12-26 MED ORDER — POTASSIUM CHLORIDE CRYS ER 20 MEQ PO TBCR: 20.0000 meq | EXTENDED_RELEASE_TABLET | Freq: Every day | ORAL | Status: DC

## 2011-12-26 MED ORDER — WARFARIN SODIUM 5 MG PO TABS: 7.5000 mg | ORAL_TABLET | Freq: Every day | ORAL | Status: DC

## 2011-12-26 MED ORDER — LISINOPRIL 2.5 MG PO TABS: 2.5000 mg | ORAL_TABLET | Freq: Every day | ORAL | Status: DC

## 2011-12-26 NOTE — Progress Notes (Signed)
Pt ambulated 573ft with no walker on room air. Pt tolerated well. Will continue to monitor

## 2011-12-26 NOTE — Progress Notes (Signed)
Subjective:  palpitations when up  Objective:  Vital Signs in the last 24 hours: Temp:  [98.1 F (36.7 C)-98.7 F (37.1 C)] 98.6 F (37 C) (06/04 0434) Pulse Rate:  [82-148] 82  (06/04 0852) Resp:  [18-20] 20  (06/04 0852) BP: (100-145)/(58-91) 107/58 mmHg (06/04 0852) SpO2:  [93 %-95 %] 95 % (06/04 0715) Weight:  [95.709 kg (211 lb)] 95.709 kg (211 lb) (06/04 0434)  Intake/Output from previous day:  Intake/Output Summary (Last 24 hours) at 12/26/11 1133 Last data filed at 12/26/11 0231  Gross per 24 hour  Intake    723 ml  Output   2002 ml  Net  -1279 ml    Physical Exam: General appearance: alert, cooperative and no distress Lungs: clear to auscultation bilaterally Heart: regular rate and rhythm   Rate: 80  Rhythm: normal sinus rhythm and 10 minutes of AF earlier at rate of 130  Lab Results:  Basename 12/25/11 0535  WBC 5.3  HGB 10.1*  PLT 115*    Basename 12/25/11 0535  NA 136  K 4.0  CL 100  CO2 29  GLUCOSE 95  BUN 23  CREATININE 1.06   No results found for this basename: TROPONINI:2,CK,MB:2 in the last 72 hours Hepatic Function Panel No results found for this basename: PROT,ALBUMIN,AST,ALT,ALKPHOS,BILITOT,BILIDIR,IBILI in the last 72 hours No results found for this basename: CHOL in the last 72 hours  Basename 12/26/11 0600  INR 1.29    Imaging: Imaging results have been reviewed  Cardiac Studies:  Assessment/Plan:   Principal Problem:  *Unstable angina Active Problems:  CAD, CABG X 3 12/20/11  AS, s/p tissue AVR 12/20/11  Chronic anticoagulation, on coumadin for pul emboli  AF, post CABG/AVR- Amioadarone  HYPERLIPIDEMIA  HYPERTENSION  History of prior pulm embolism  Carotid artery disease, bilateral, asymptomatic  COPD by CXR  Cardiomyopathy, EF 45-50% pre op  S/P CABG x 3: LIMA to LAD, SVG to PD, SVG to OM.    Plan- continue Amio load as outlined by Dr Arnette Felts PA-C 12/26/2011, 11:33 AM    Agree with note  written by Corine Shelter PAC  POD #6 CABG/tissue AVR. Looks great! VSS. Labs OK. Exam benign. PAF on coumadin and Amio. SR now. Mild periph edema. Cont. Gentle diuresis. CRH. Ambulating w/o difficulty. Home soon. ROV with me 2-3 weeks  Lisette Mancebo J 12/26/2011 1:24 PM

## 2011-12-26 NOTE — Progress Notes (Signed)
HR up to 140's while pt up OOB; pt assisted back to chair; PA in room to see pt; order to give scheduled Loperessor and amiodarone now as soon as available from pharmacy; will cont. To monitor.

## 2011-12-26 NOTE — Discharge Instructions (Signed)
Endoscopic Saphenous Vein Harvesting Care After Refer to this sheet in the next few weeks. These instructions provide you with information on caring for yourself after your procedure. Your caregiver may also give you more specific instructions. Your treatment has been planned according to current medical practices, but problems sometimes occur. Call your caregiver if you have any problems or questions after your procedure. HOME CARE INSTRUCTIONS Medicine  Take whatever pain medicine your surgeon prescribes. Follow the directions carefully. Do not take over-the-counter pain medicine unless your surgeon says it is okay. Some pain medicine can cause bleeding problems for several weeks after surgery.   Follow your surgeon's instructions about driving. You will probably not be permitted to drive after heart surgery.   Take any medicines your surgeon prescribes. Any medicines you took before your heart surgery should be checked with your caregiver before you start taking them again.  Wound care  Ask your surgeon how long you should keep wearing your elastic bandage or stocking.   Check the area around your surgical cuts (incisions) whenever your bandages (dressings) are changed. Look for any redness or swelling.   You will need to return to have the stitches (sutures) or staples taken out. Ask your surgeon when to do that.   Ask your surgeon when you can shower or bathe.  Activity  Try to keep your legs raised when you are sitting.   Do any exercises your caregivers have given you. These may include deep breathing exercises, coughing, walking, or other exercises.  SEEK MEDICAL CARE IF:  You have any questions about your medicines.   You have more leg pain, especially if your pain medicine stops working.   New or growing bruises develop on your leg.   Your leg swells, feels tight, or becomes red.   You have numbness in your leg.  SEEK IMMEDIATE MEDICAL CARE IF:  Your pain gets much  worse.   Blood or fluid leaks from any of the incisions.   Your incisions become warm, swollen, or red.   You have chest pain.  You have trouble breathing. Atrial Fibrillation Atrial fibrillation is an abnormal heartbeat (rhythm). It can cause your heart rate to be faster or slower than normal, and can cause clots of blood to form in your heart. These clots can cause other health problems. Atrial fibrillation may be caused by a heart attack, lung problem, or certain medicine. Sometimes the cause of atrial fibrillation is not found. HOME CARE  Take blood thinning medicine (anticoagulants) as told by your doctor. Your doctor will need to draw your blood to check lab values if you take blood thinners.   If you had a cardioversion, limit your activity as told by your doctor.   Learn how to check your heartbeat (pulse) for an abnormal or irregular beat. Your doctor can show you how.   Ask your doctor if it is okay to exercise.   Only take medicine as told by your doctor.  GET HELP RIGHT AWAY IF:   You have trouble breathing or feel dizzy.   You have puffy (swollen) feet or ankles.   You have blood in your pee (urine) or poop (bowel movement).   You feel your heart "skipping" beats.   You feel your heart "racing" or beating fast.   You have weakness in your arms or legs.   You have trouble talking, seeing, or thinking.   You have chest pain or pain in your arm or jaw.  MAKE SURE YOU:  Understand these instructions.   Will watch your condition.   Will get help right away if you are not doing well or get worse.  Document Released: 04/18/2008 Document Revised: 06/29/2011 Document Reviewed: 10/28/2009  [facility] Patient Information 2012 [place], [place].  You have a fever.   You have more pain near your leg incision.  MAKE SURE YOU:  Understand these instructions.   Will watch your condition.   Will get help right away if you are not doing well or get worse.  Document  Released: 03/22/2011 Document Revised: 06/29/2011 Document Reviewed: 03/22/2011 [facility] Patient Information 2012 [place], [place].Coronary Artery Bypass Grafting Care After Refer to this sheet in the next few weeks. These instructions provide you with information on caring for yourself after your procedure. Your caregiver may also give you more specific instructions. Your treatment has been planned according to current medical practices, but problems sometimes occur. Call your caregiver if you have any problems or questions after your procedure.  Recovery from open heart surgery will be different for everyone. Some people feel well after 3 or 4 weeks, while for others it takes longer. After heart surgery, it may be normal to:  Not have an appetite, feel nauseated by the smell of food, or only want to eat a small amount.   Be constipated because of changes in your diet, activity, and medicines. Eat foods high in fiber. Add fresh fruits and vegetables to your diet. Stool softeners may be helpful.   Feel sad or unhappy. You may be frustrated or cranky. You may have good days and bad days. Do not give up. Talk to your caregiver if you do not feel better.   Feel weakness and fatigue. You many need physical therapy or cardiac rehabilitation to get your strength back.   Develop an irregular heartbeat called atrial fibrillation. Symptoms of atrial fibrillation are a fast, irregular heartbeat or feelings of fluttery heartbeats, shortness of breath, low blood pressure, and dizziness. If these symptoms develop, see your caregiver right away.  MEDICATION  Have a list of all the medicines you will be taking when you leave the hospital. For every medicine, know the following:   Name.   Exact dose.   Time of day to be taken.   How often it should be taken.   Why you are taking it.   Ask which medicines should or should not be taken together. If you take more than one heart medicine, ask if it is okay  to take them together. Some heart medicines should not be taken at the same time because they may lower your blood pressure too much.   Narcotic pain medicine can cause constipation. Eat fresh fruits and vegetables. Add fiber to your diet. Stool softener medicine may help relieve constipation.   Keep a copy of your medicines with you at all times.   Do not add or stop taking any medicine until you check with your caregiver.   Medicines can have side effects. Call your caregiver who prescribed the medicine if you:   Start throwing up, have diarrhea, or have stomach pain.   Feel dizzy or lightheaded when you stand up.   Feel your heart is skipping beats or is beating too fast or too slow.   Develop a rash.   Notice unusual bruising or bleeding.  HOME CARE INSTRUCTIONS  After heart surgery, it is important to learn how to take your pulse. Have your caregiver show you how to take your pulse.   Use  your incentive spirometer. Ask your caregiver how long after surgery you need to use it.  Care of your chest incision  Tell your caregiver right away if you notice clicking in your chest (sternum).   Support your chest with a pillow or your arms when you take deep breaths and cough.   Follow your caregiver's instructions about when you can bathe or swim.   Protect your incision from sunlight during the first year to keep the scar from getting dark.   Tell your caregiver if you notice:   Increased tenderness of your incision.   Increased redness or swelling around your incision.   Drainage or pus from your incision.  Care of your leg incision(s)  Avoid crossing your legs.   Avoid sitting for long periods of time. Change positions every half hour.   Elevate your leg(s) when you are sitting.   Check your leg(s) daily for swelling. Check the incisions for redness or drainage.   Wear your elastic stockings as told by your caregiver. Take them off at bedtime.  Diet  Diet is very  important to heart health.   Eat plenty of fresh fruits and vegetables. Meats should be lean cut. Avoid canned, processed, and fried foods.   Talk to a dietician. They can teach you how to make healthy food and drink choices.  Weight  Weigh yourself every day. This is important because it helps to know if you are retaining fluid that may make your heart and lungs work harder.   Use the same scale each time.   Weigh yourself every morning at the same time. You should do this after you go to the bathroom, but before you eat breakfast.   Your weight will be more accurate if you do not wear any clothes.   Record your weight.   Tell your caregiver if you have gained 2 pounds or more overnight.  Activity Stop any activity at once if you have chest pain, shortness of breath, irregular heartbeats, or dizziness. Get help right away if you have any of these symptoms.  Bathing.  Avoid soaking in a bath or hot tub until your incisions are healed.   Rest. You need a balance of rest and activity.   Exercise. Exercise per your caregiver's advice. You may need physical therapy or cardiac rehabilitation to help strengthen your muscles and build your endurance.   Climbing stairs. Unless your caregiver tells you not to climb stairs, go up stairs slowly and rest if you tire. Do not pull yourself up by the handrail.   Driving a car. Follow your caregiver's advice on when you may drive. You may ride as a passenger at any time. When traveling for long periods of time in a car, get out of the car and walk around for a few minutes every 2 hours.   Lifting. Avoid lifting, pushing, or pulling anything heavier than 10 pounds for 6 weeks after surgery or as told by your caregiver.   Returning to work. Check with your caregiver. People heal at different rates. Most people will be able to go back to work 6 to 12 weeks after surgery.   Sexual activity. You may resume sexual relations as told by your caregiver.    SEEK MEDICAL CARE IF:  Any of your incisions are red, painful, or have any type of drainage coming from them.   You have an oral temperature above 102 F (38.9 C).   You have ankle or leg swelling.  You have pain in your legs.   You have weight gain of 2 or more pounds a day.   You feel dizzy or lightheaded when you stand up.  SEEK IMMEDIATE MEDICAL CARE IF:  You have angina or chest pain that goes to your jaw or arms. Call your local emergency services right away.   You have shortness of breath at rest or with activity.   You have a fast or irregular heartbeat (arrhythmia).   There is a "clicking" in your sternum when you move.   You have numbness or weakness in your arms or legs.  MAKE SURE YOU:  Understand these instructions.   Will watch your condition.   Will get help right away if you are not doing well or get worse.  Document Released: 01/27/2005 Document Revised: 06/29/2011 Document Reviewed: 09/14/2010 [facility] Patient Information 2012 [place], [place].Aortic Valve Replacement Care After Read the instructions outlined below and refer to this sheet for the next few weeks. These discharge instructions provide you with general information on caring for yourself after you leave the hospital. Your surgeon may also give you specific instructions. While your treatment has been planned according to the most current medical practices available, unavoidable complications occasionally occur. If you have any problems or questions after discharge, please call your surgeon. AFTER THE PROCEDURE  Full recovery from heart valve surgery can take several months.   Blood thinning (anticoagulation) treatment with warfarin is often prescribed for 6 weeks to 3 months after surgery for those with biological valves. It is prescribed for life for those with mechanical valves.   Recovery includes healing of the surgical incision. There is a gradual building of stamina and exercise  abilities. An exercise program under the direction of a physical therapist may be recommended.   Once you have an artificial valve, your heart function and your life will return to normal. You usually feel better after surgery. Shortness of breath and fatigue should lessen. If your heart was already severely damaged before your surgery, you may continue to have problems.   You can usually resume most of your normal activities. You will have to continue to monitor your condition. You need to watch out for blood clots and infections.   Artificial valves need to be replaced after a period of time. It is important that you see your caregiver regularly.   Some individuals with an aortic valve replacement need to take antibiotics before having dental work or other surgical procedures. This is called prophylactic antibiotic treatment. These drugs help to prevent infective endocarditis. Antibiotics are only recommended for individuals with the highest risk for developing infective endocarditis. Let your dentist and your caregiver know if you have a history of any of the following so that the necessary precautions can be taken:   A VSD.   A repaired VSD.   Endocarditis in the past.   An artificial (prosthetic) heart valve.  HOME CARE INSTRUCTIONS   Use all medications as prescribed.   Take your temperature every morning for the first week after surgery. Record these.   Weigh yourself every morning for at least the first week after surgery and record.   Do not lift more than 10 pounds (4.5 kg) until your breastbone (sternum) has healed. Avoid all activities which would place strain on your incision.   You may shower as soon as directed by your caregiver after surgery. Pat incisions dry. Do not rub incisions with washcloth or towel.   Avoid driving for 4 to  6 weeks following surgery or as instructed.   Use your elastic stockings during the day. You should wear the stockings for at least 2 weeks  after discharge or longer if your ankles are swollen. The stockings help blood flow and help reduce swelling in the legs. It is easiest to put the stockings on before you get out of bed in the morning. They should fit snugly.  Pain Control  If a prescription was given for a pain reliever, please follow your doctor's directions.   If the pain is not relieved by your medicine, becomes worse, or you have difficulty breathing, call your surgeon.  Activity  Take frequent rest periods throughout the day.   Wait one week before returning to strenuous activities such as heavy lifting (more than 10 pounds), pushing or pulling.   Talk with your doctor about when you may return to work and your exercise routine.   Do not drive while taking prescription pain medication.  Nutrition  You may resume your normal diet.   Drink plenty of fluids (6-8 glasses a day).   Eat a well-balanced diet.   Call your caregiver for persistent nausea or vomiting.  Elimination Your normal bowel function should return. If constipation should occur, you may:  Take a mild laxative.   Add fruit and bran to your diet.   Drink more fluids.   Call your doctor if constipation is not relieved.  SEEK IMMEDIATE MEDICAL CARE IF:   You develop chest pain which is not coming from your surgical cut (incision).   You develop shortness of breath or have difficulty breathing.   You develop a temperature over 101 F (38.3 C).   You have a sudden weight gain. Let your caregiver know what the weight gain is.   You develop a rash.   You develop any reaction or side effects to medications given.   You have increased bleeding from wounds.   You see redness, swelling, or have increasing pain in wounds.   You have pus coming from your wound.   You develop lightheadedness or feel faint.  Document Released: 01/26/2005 Document Revised: 06/29/2011 Document Reviewed: 04/19/2005 [facility] Patient Information 2012 [place],  [place].

## 2011-12-26 NOTE — Progress Notes (Signed)
CARDIAC REHAB PHASE I   PRE:  Rate/Rhythm: 85SR  BP:  Supine:   Sitting: 96/49  Standing:    SaO2: 96%RA  MODE:  Ambulation: 350 ft   POST:  Rate/Rhythem: 101 ST  BP:  Supine:   Sitting: 127/56  Standing:    SaO2: 98%RA 1000-1040 Pt  Walked 350 ft on RA with handheld asst  While his RN watched HR. Did not get above 101 ST. Pt a little anxious about increased HR this am. Gave reassurance. Did not go as far in distance as yesterday as pt just got his meds for HR control. To chair after walk. Discussed CRP2 with pt and wife. Permission given to refer to [place] Phase 2. To recliner after walk. Reviewed sternal pres.  Duanne Limerick

## 2011-12-26 NOTE — Progress Notes (Signed)
[address]            [place] [address]          [phone]     6 Days Post-Op  Procedure(s) (LRB): CORONARY ARTERY BYPASS GRAFTING (CABG) (N/A) AORTIC VALVE REPLACEMENT (AVR) (N/A) CYSTOSCOPY (N/A) Subjective: Palpitations with ambulation in room today  Objective  Telemetry - SR, PAF  Temp:  [98.1 F (36.7 C)-98.7 F (37.1 C)] 98.6 F (37 C) (06/04 0434) Pulse Rate:  [83-148] 92  (06/04 0722) Resp:  [18-20] 20  (06/04 0722) BP: (100-145)/(63-91) 127/91 mmHg (06/04 0722) SpO2:  [93 %-95 %] 95 % (06/04 0715) Weight:  [211 lb (95.709 kg)] 211 lb (95.709 kg) (06/04 0434)   Intake/Output Summary (Last 24 hours) at 12/26/11 0806 Last data filed at 12/26/11 0231  Gross per 24 hour  Intake    723 ml  Output   2002 ml  Net  -1279 ml       General appearance: alert, cooperative and no distress Heart: regular rate and rhythm, S1, S2 normal and no murmur Lungs: clear to auscultation bilaterally Abdomen: soft,nontender, + BS Extremities: minor edema Wound: incisions healing well  Lab Results:  Basename 12/25/11 0535  NA 136  K 4.0  CL 100  CO2 29  GLUCOSE 95  BUN 23  CREATININE 1.06  CALCIUM 8.8  MG --  PHOS --   No results found for this basename: AST:2,ALT:2,ALKPHOS:2,BILITOT:2,PROT:2,ALBUMIN:2 in the last 72 hours No results found for this basename: LIPASE:2,AMYLASE:2 in the last 72 hours  Basename 12/25/11 0535  WBC 5.3  NEUTROABS --  HGB 10.1*  HCT 29.7*  MCV 94.0  PLT 115*   No results found for this basename: CKTOTAL:4,CKMB:4,TROPONINI:4 in the last 72 hours No components found with this basename: POCBNP:3 No results found for this basename: DDIMER in the last 72 hours No results found for this basename: HGBA1C in the last 72 hours No results found for this basename: CHOL,HDL,LDLCALC,TRIG,CHOLHDL in the last 72 hours No results found for this basename: TSH,T4TOTAL,FREET3,T3FREE,THYROIDAB in the last 72  hours No results found for this basename: VITAMINB12,FOLATE,FERRITIN,TIBC,IRON,RETICCTPCT in the last 72 hours  Medications: Scheduled    . amiodarone  400 mg Oral BID  . aspirin EC  81 mg Oral Daily  . atorvastatin  40 mg Oral q1800  . docusate sodium  200 mg Oral Daily  . insulin aspart  0-24 Units Subcutaneous TID AC & HS  . lisinopril  2.5 mg Oral Daily  . metoprolol tartrate  25 mg Oral TID  . pantoprazole  40 mg Oral QAC breakfast  . potassium chloride  20 mEq Oral Daily  . sodium chloride  3 mL Intravenous Q12H  . warfarin  5 mg Oral ONCE-1800  . Warfarin - Physician Dosing Inpatient   Does not apply q1800  . DISCONTD: docusate sodium  200 mg Oral Daily  . DISCONTD: insulin glargine  15 Units Subcutaneous Daily     Radiology/Studies:  No results found.  INR:1.29 Will add last result for INR, ABG once components are confirmed Will add last 4 CBG results once components are confirmed  Assessment/Plan: S/P Procedure(s) (LRB): CORONARY ARTERY BYPASS GRAFTING (CABG) (N/A) AORTIC VALVE REPLACEMENT (AVR) (N/A) CYSTOSCOPY (N/A)  1. PAF- cont Beta blocker, amiodarone 2 cont ac rx - will give 7.5 coumadin today 3 pulm toilet/rehab   LOS: 12 days  Curtis Brennan E 6/4/20138:06 AM

## 2011-12-26 NOTE — Progress Notes (Signed)
Pt ambulated 550 feet in hallway with RN without any difficulty; will cont. To monitor.

## 2011-12-27 LAB — PROTIME-INR
INR: 1.24 (ref 0.00–1.49)
Prothrombin Time: 15.9 seconds — ABNORMAL HIGH (ref 11.6–15.2)

## 2011-12-27 LAB — GLUCOSE, CAPILLARY
Glucose-Capillary: 106 mg/dL — ABNORMAL HIGH (ref 70–99)
Glucose-Capillary: 144 mg/dL — ABNORMAL HIGH (ref 70–99)

## 2011-12-27 NOTE — Progress Notes (Signed)
[id number] Education completed with pt and family. Referring to [facility] Phase 2. Put on [facility] d/c video for pt and family to view. Pt on bedrest on for pacing wire removal. Yarieliz Wasser DunlapRN

## 2011-12-27 NOTE — Progress Notes (Signed)
[address]            [place] [address]          [phone]     7 Days Post-Op  Procedure(s) (LRB): CORONARY ARTERY BYPASS GRAFTING (CABG) (N/A) AORTIC VALVE REPLACEMENT (AVR) (N/A) CYSTOSCOPY (N/A) Subjective: Feels well, no complaints  Objective  Telemetry SR with pac's, no afib in about 24 hours  Temp:  [97.5 F (36.4 C)-98.4 F (36.9 C)] 98.4 F (36.9 C) (06/05 0438) Pulse Rate:  [82-90] 90  (06/05 0438) Resp:  [18-24] 18  (06/05 0438) BP: (95-130)/(57-76) 119/76 mmHg (06/05 0438) SpO2:  [93 %-95 %] 93 % (06/05 0438) Weight:  [208 lb 8.9 oz (94.6 kg)] 208 lb 8.9 oz (94.6 kg) (06/05 0438)   Intake/Output Summary (Last 24 hours) at 12/27/11 0758 Last data filed at 12/26/11 2051  Gross per 24 hour  Intake    483 ml  Output   1600 ml  Net  -1117 ml       General appearance: alert, cooperative and no distress Heart: regular rate and rhythm and S1, S2 normal Lungs: clear to auscultation bilaterally Abdomen: benign exam Extremities: minor L LE edema Wound: incisions healing well  Lab Results:  Basename 12/25/11 0535  NA 136  K 4.0  CL 100  CO2 29  GLUCOSE 95  BUN 23  CREATININE 1.06  CALCIUM 8.8  MG --  PHOS --   No results found for this basename: AST:2,ALT:2,ALKPHOS:2,BILITOT:2,PROT:2,ALBUMIN:2 in the last 72 hours No results found for this basename: LIPASE:2,AMYLASE:2 in the last 72 hours  Basename 12/25/11 0535  WBC 5.3  NEUTROABS --  HGB 10.1*  HCT 29.7*  MCV 94.0  PLT 115*   No results found for this basename: CKTOTAL:4,CKMB:4,TROPONINI:4 in the last 72 hours No components found with this basename: POCBNP:3 No results found for this basename: DDIMER in the last 72 hours No results found for this basename: HGBA1C in the last 72 hours No results found for this basename: CHOL,HDL,LDLCALC,TRIG,CHOLHDL in the last 72 hours No results found for this basename: TSH,T4TOTAL,FREET3,T3FREE,THYROIDAB in the last  72 hours No results found for this basename: VITAMINB12,FOLATE,FERRITIN,TIBC,IRON,RETICCTPCT in the last 72 hours  Medications: Scheduled    . amiodarone  200 mg Oral Daily  . amiodarone  400 mg Oral BID  . amiodarone  400 mg Oral Daily  . aspirin EC  81 mg Oral Daily  . atorvastatin  40 mg Oral q1800  . docusate sodium  200 mg Oral Daily  . furosemide  40 mg Oral Daily  . insulin aspart  0-24 Units Subcutaneous TID AC & HS  . lisinopril  2.5 mg Oral Daily  . metoprolol tartrate  25 mg Oral TID  . pantoprazole  40 mg Oral QAC breakfast  . potassium chloride  20 mEq Oral Daily  . sodium chloride  3 mL Intravenous Q12H  . warfarin  7.5 mg Oral ONCE-1800  . Warfarin - Physician Dosing Inpatient   Does not apply q1800     Radiology/Studies:  No results found.  INR:1.24  Will add last result for INR, ABG once components are confirmed Will add last 4 CBG results once components are confirmed  Assessment/Plan: S/P Procedure(s) (LRB): CORONARY ARTERY BYPASS GRAFTING (CABG) (N/A) AORTIC VALVE REPLACEMENT (AVR) (N/A) CYSTOSCOPY (N/A)  Doing well, will d/c epw's this am and if no further afib, will d/c this  afternoon on 7.5 of coumadin and get INR check on friday   LOS: 13 days    Riyah Bardon E 6/5/20137:58 AM

## 2011-12-27 NOTE — Discharge Summary (Signed)
[address]            [place] [address]          [phone]      Curtis Brennan [birth year] 78 y.o. [id number]  12/14/2011   [name], MD  Chest pain CAD, As  History of Present Illness:  Patient is a 78 year old male with known heart murmur and history of coronary occlusive disease has been followed by Dr. Allyson Sabal. Over the past weeks to months he has noted increasing exertional chest discomfort and shortness of breath. The patient notes that this is been going on for several weeks to his family notes that he's had general decline for months and is physical ability. They note that he gets significantly short of breath walking to the mailbox. He's had a known heart murmur and a echocardiogram in 2002 and showed moderate aortic stenosis with a valve area of 1.2 cm.he was seen by Dr. Nanetta Batty and was felt to require admission for cardiac catheterization, further evaluation and treatment.   Past Medical History   Diagnosis  Date   .  Prostate cancer    .  Complication of anesthesia  07/1997     "just wouldn't wake wakeup"   .  PONV (postoperative nausea and vomiting)    .  Coronary artery disease    .  Hypertension    .  Heart murmur    .  Angina    .  Pulmonary embolism  ~ 2011     "both lungs"   .  Legionnaire's disease  1986   .  Pneumonia      "once"   .  Exertional dyspnea    .  Kidney stones    .  Unstable angina  12/15/2011   .  CAD (coronary artery disease) with history of LAD & Diag athrectomy in 1994, now with severe 3 vesseal CAD by cath 12/14/11 for CABG  12/15/2011   .  AS (aortic stenosis) moderate to severe by cath 12/14/11 for AVR  12/15/2011   .  Chronic anticoagulation, on coumadin for pul emboli  12/15/2011   .  Carotid artery disease, bilateral  12/15/2011    Past Surgical History   Procedure  Date   .  Coronary angioplasty  1994     "roto rooter"   .  Cardiac catheterization  12/14/11   .  Cholecystectomy  2001    .  Prostate surgery- prostate cancer treated with resection  07/1997   .  Lithotripsy  2006   .  Gallstone removed- ERCP  ~ 2010     "was lodged below pancreatis"    History   Smoking status   .  Never Smoker   Smokeless tobacco   .  Never Used    History   Alcohol Use  No    History    Social History   .  Marital Status:  Married     Spouse Name:  N/A     Number of Children:  N/A   .  Years of Education:  N/A    Occupational History   .  Not on file.    Social History Main Topics   .  Smoking status:  Never Smoker   .  Smokeless tobacco:  Never Used   .  Alcohol Use:  No   .  Drug Use:  No   .  Sexually Active:  Yes    Other Topics  Concern   .  Not on file    Social History Narrative   .  No narrative on file    Allergies   Allergen  Reactions   .  Sulfonamide Derivatives  Rash     "I don't even remember having problem; dr says I did"    Prescriptions prior to admission   Medication  Sig  Dispense  Refill   .  amLODipine (NORVASC) 5 MG tablet  Take 5 mg by mouth 2 (two) times daily.     [name]  aspirin 81 MG tablet  Take 81 mg by mouth daily.     [name]  lisinopril (PRINIVIL,ZESTRIL) 10 MG tablet  Take 10 mg by mouth daily.     .  metoprolol tartrate (LOPRESSOR) 25 MG tablet  Take 25 mg by mouth 2 (two) times daily.     [name]  warfarin (COUMADIN) 10 MG tablet  Take 10 mg by mouth daily.      History reviewed. No pertinent family history.  Review of Systems: At time of consultation Cardiac Review of Systems: Y or N  Chest Pain Cove.Etienne ] Resting SOB [ n ] Exertional SOB Cove.Etienne ] Orthopnea [ y ]  Pedal Edema [ y ] Palpitations [n ] Syncope [ n ] Presyncope [ y ]  General Review of Systems: [Y] = yes [ ] =no  Constitional: recent weight change [ n]; anorexia [ ] ; fatigue [ ] ; nausea [ ] ; night sweats [ n ]; fever [n ]; or chills [ ] ;  Dental: poor dentition[n ]; Last Dentist visit: last month  Eye : blurred vision [ ] ; diplopia [ ] ; vision changes [ ] ; Amaurosis fugax[ ] ;  Resp:  cough [ ] ; wheezing[ n ]; hemoptysis[ n ]; shortness of breath[ y ]; paroxysmal nocturnal dyspnea[ n]; dyspnea on exertion[y ]; or orthopnea[ n ];  GI: gallstones[ ] , vomiting[ ] ; dysphagia[ ] ; melena[ ] ; hematochezia [ ] ; heartburn[ ] ; Hx of Colonoscopy[ ] ;  GU: kidney stones [ ] ; hematuria[ ] ; dysuria [ ] ; nocturia[ ] ; history of obstruction [ ] ;  Skin: rash, swelling[ ] ;, hair loss[ ] ; peripheral edema[ ] ; or itching[ ] ;  Musculosketetal: myalgias[ ] ; joint swelling[ ] ; joint erythema[ ] ;  joint pain[ ] ; back pain[ ] ;  Heme/Lymph: bruising[ ] ; bleeding[ ] ; anemia[ ] ;  Neuro: TIA[ ] ; headaches[ ] ; stroke[ n ]; vertigo[y ]; seizures[ ] ; paresthesias[ ] ; difficulty walking[ n ];  Psych:depression[n ]; anxiety[ n ];  Endocrine: diabetes[n ]; thyroid dysfunction[ n ];  Immunizations: Flu [ n ]; Pneumococcal[n ];  Other:  Physical Exam: At time of consultation BP 107/60  Pulse 70  Temp(Src) 97.9 F (36.6 C) (Oral)  Resp 17  Ht 5\' 9"  (1.753 m)  Wt 208 lb 5.4 oz (94.5 kg)  BMI 30.77 kg/m2  SpO2 96%  Physical Exam  Constitutional: He is oriented to person, place, and time. No distress.  HENT:  Head: Normocephalic.  Eyes: Right eye exhibits no discharge.  Neck: Normal carotid pulses and no JVD present. Carotid bruit is not present. No tracheal deviation present. No mass and no thyromegaly present.  Cardiovascular: Normal rate and regular rhythm.  Murmur heard.  Crescendo systolic murmur is present with a grade of 4/6  No diastolic murmur is present  Pulses:  Carotid pulses are 2+ on the right side, and 2+ on the left side.  Radial pulses are 2+ on the right side, and  2+ on the left side.  Femoral pulses are 2+ on the right side, and 2+ on the left side.  Popliteal pulses are 2+ on the right side, and 2+ on the left side.  Dorsalis pedis pulses are 2+ on the right side, and 2+ on the left side.  Posterior tibial pulses are 2+ on the right side, and 2+ on the left side.    Pulmonary/Chest: Effort normal. No stridor. No respiratory distress. He has no wheezes. He has no rhonchi. He has no rales.  Abdominal: He exhibits no distension and no mass. There is no tenderness. There is no rebound and no guarding.  Musculoskeletal: He exhibits no edema and no tenderness.  Lymphadenopathy:  He has no cervical adenopathy.  He has no axillary adenopathy.  Neurological: He is alert and oriented to person, place, and time.  Skin: He is not diaphoretic.  Psychiatric: Mood, memory, affect and judgment normal.  right leg venous varicosities  Patient has full upper plate , with full upper dental extraction  Lower teeth are intact without any loose or obviously infected teeth  Diagnostic Studies & Laboratory data:  Recent Radiology Findings:  Dg Chest 2 View  12/11/2011 *RADIOLOGY REPORT* Clinical Data: Chest pain. Short of breath. CHEST - 2 VIEW Comparison: 05/19/2010 Findings: Borderline cardiomegaly. Nodular interstitial changes throughout both lungs are stable. Low volumes. Pleural changes at the right base. Intact thoracic spine. No pleural effusion. IMPRESSION: Cardiomegaly and chronic interstitial changes. Original Report Authenticated By: Donavan Burnet, M.D.   Cardiac Cath:  [facility]  CARDIAC CATHETERIZATION  History obtained from chart review. Curtis Brennan is a 78 year old married Caucasian male father of 3 children with history of CAD status post LAD and diagonal branch directional atherectomy by myself in 1994. History of multiple pulmonary emboli in the past has been on Coumadin anticoagulation. He also has bilateral carotid disease left greater than right Followed by duplex ultrasound in the office. He said several weeks of exertional chest pain shortness of breath recently. Echo performed in our office several days ago revealed decrease in his aortic valve area from 1.2 down to 0.8 cm. He presents today for right and left heart cath to  define his anatomy and physiology.  PROCEDURE DESCRIPTION  The patient was brought to the second floor  [facility] cath lab in the postabsorptive state. He was  premedicated with Valium 5 mg by mouth and IV Versed and fentanyl. His right groin  was prepped and shaved in usual sterile fashion. Xylocaine 1% was used  for local anesthesia. A 6 French sheath was inserted into the right common femoral  artery using standard Seldinger technique.a 7 [place] sheath was inserted into the right common femoral vein using standard Seldinger technique. A 7 French Swan-Ganz catheter was then advanced through the right heart chambers obtaining sequential pressures, arterial and venous blood samples for Fick and thermal dilution cardiac outputs. Visipaque dye was used for the entirety of the case. Retroperiaortic, left ventriculogram pullback pressures were recorded.  HEMODYNAMICS:  AO SYSTOLIC/AO DIASTOLIC: 110/66  LV SYSTOLIC/LV DIASTOLIC: 143/18  RA pressure: 14/11  Right ventricular pressure:32/8  Pulmonary artery pressure:32/13  Pulmonary wedge pressure:15/13  Aortic valve area: 1 cm     Aortic valve gradient: 33 mm peak to peak  :  ANGIOGRAPHIC RESULTS:  1. Left main; 60 %  2. LAD; 95% proximal  3. Left circumflex; 90% mid.  4. Right coronary artery; nondominant, 60% proximal  5.LIMA was widely patent  and suitable for coronary artery bypass grafting  IMPRESSION:Curtis Brennan has. Left main 3 vessel disease and moderate aortic stenosis. He will need coronary artery bypass grafting and aortic valve replacement. Because He is already on Coumadin anticoagulation for pulmonary embol. he will need a mechanical aortic valve. Because of the critical nature of his anatomy and his unstable symptoms he will need to stay in the hospital for his surgical procedure.  Runell Gess MD, [facility]  12/14/2011  4:07 PM    ECHO: Echocardiogram is dated 12/11/2011 paper report accompanies the patient from  [facility] mild mitral regurgitation is noted aortic valve area is gone from 1.2-0.85 cm there is no aortic regurgitation appreciated aortic root size is normal maximum velocity 411 cm/s with estimated peak gradient of 68 mm Hg         Hospital Course: The patient was referred to Dr. Sheliah Plane in cardiothoracic surgical consultation. Recommendations for coronary artery bypass grafting and aortic valve replacement were given. The patient remained hospitalized until the time of the procedure. On 12/21/2011 he was taken the operating room where he underwent the following:  OPERATIVE REPORT  [id number]  PREOPERATIVE DIAGNOSES: Coronary occlusive disease with left main  obstruction and aortic stenosis.  POSTOPERATIVE DIAGNOSES: Coronary occlusive disease with left main  obstruction and aortic stenosis.  SURGICAL PROCEDURE: Aortic valve replacement with pericardial tissue  valve, [employer]esc[employer], model 3300TFX, 23 mm, serial number [id number],  and coronary artery bypass grafting x3 with the left internal mammary to  the left anterior descending coronary artery, reverse saphenous vein  graft to the circumflex coronary artery, reverse saphenous vein graft to  the posterior descending coronary artery arising off the distal  circumflex with left thigh and calf Endovein harvesting.  SURGEON: Sheliah Plane, M.D.  FIRST ASSISTANT: Lowella Dandy, PA. The patient tolerated the procedure well without obvious complication was transferred to the surgical intensive care unit. Of note the patient did require preoperative urology consultation to 2 Inability to Pl., Foley catheter. Dr. Jerilee Field saw the patient in the operating room and he felt this was most consistent with bladder neck contracture. He placed the Foley without difficulty.  Postoperative hospital course The patient has overall progressed nicely. He has maintained stable hemodynamics. He did, however have  postoperative atrial fibrillation which has been chemically cardioverted to normal sinus rhythm with amiodarone and beta blockade. He is also on Coumadin for the mechanical valve as well as his history of pulmonary embolism. All routine lines, monitors, and drainage devices have been discontinued in the standard fashion. He is tolerating gradually increasing activities using standard protocols. He does have an acute blood loss anemia which has stabilized. Incisions are healing well without evidence of infection. Renal function is within normal limits. The Foley catheter has been removed and the patient was given instructions in regard to followup  with urology. He has a moderate volume overload but is responding well to diuresis. Blood sugars have been under good control using standard protocols. Overall his status is felt to be stable for discharge on today's date if he has no further dysrhythmias following removal of his epicardial pacing wires.   Basename 12/25/11 0535  NA 136  K 4.0  CL 100  CO2 29  GLUCOSE 95  BUN 23  CALCIUM 8.8    Basename 12/25/11 0535  WBC 5.3  HGB 10.1*  HCT 29.7*  PLT 115*    Basename 12/27/11 0530 12/26/11 0600  INR 1.24 1.29  Discharge Instructions:  The patient is discharged to home with extensive instructions on wound care and progressive ambulation.  They are instructed not to drive or perform any heavy lifting until returning to see the physician in his office.  Discharge Diagnosis:  Chest pain CAD, As  Secondary Diagnosis: Patient Active Problem List  Diagnoses  . HYPERLIPIDEMIA  . HYPERTENSION  . History of prior pulm embolism  . PULMONARY HYPERTENSION  . CAD, CABG X 3 12/20/11  . AS, s/p tissue AVR 12/20/11  . Chronic anticoagulation, on coumadin for pul emboli  . Carotid artery disease, bilateral, asymptomatic  . COPD by CXR  . 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   . AF, post CABG/AVR- Amioadarone  . Cardiomyopathy, EF  45-50% pre op  . Unstable angina   Past Medical History  Diagnosis Date  . Prostate cancer   . Complication of anesthesia 07/1997    "just wouldn't wake wakeup"  . PONV (postoperative nausea and vomiting)   . Coronary artery disease   . Hypertension   . Heart murmur   . Angina   . Pulmonary embolism ~ 2011    "both lungs"  . Legionnaire's disease 1986  . Pneumonia     "once"  . Exertional dyspnea   . Kidney stones   . Unstable angina 12/15/2011  . CAD (coronary artery disease) with history of LAD & Diag athrectomy in 1994, now with severe 3 vesseal CAD by cath 12/14/11 for CABG 12/15/2011  . AS (aortic stenosis) moderate to severe by cath 12/14/11 for AVR  12/15/2011  . Chronic anticoagulation, on coumadin for pul emboli 12/15/2011  . Carotid artery disease, bilateral 12/15/2011       Nylen, Creque  Home Medication Instructions [id number]   Printed on:12/27/11 0813  Medication Information                    aspirin 81 MG tablet Take 81 mg by mouth daily.           amiodarone (PACERONE) 400 MG tablet Take 1 tablet (400 mg total) by mouth 2 (two) times daily. For 7 days then take 400 mg once daily           atorvastatin (LIPITOR) 40 MG tablet Take 1 tablet (40 mg total) by mouth daily at 6 PM.           furosemide (LASIX) 40 MG tablet Take 1 tablet (40 mg total) by mouth daily.           lisinopril (PRINIVIL,ZESTRIL) 2.5 MG tablet Take 1 tablet (2.5 mg total) by mouth daily.           metoprolol tartrate (LOPRESSOR) 25 MG tablet Take 1 tablet (25 mg total) by mouth 3 (three) times daily.           potassium chloride SA (K-DUR,KLOR-CON) 20 MEQ tablet Take 1 tablet (20 mEq total) by mouth daily.           traMADol (ULTRAM) 50 MG tablet Take 1 tablet (50 mg total) by mouth every 6 (six) hours as needed.           warfarin (COUMADIN) 5 MG tablet Take 1.5 tablets (7.5 mg total) by mouth daily. As directed by coumadin clinic             Disposition: For discharge  home  Patient's condition is Good  Gershon Crane, PA-C 12/27/2011  8:13 AM

## 2011-12-27 NOTE — Progress Notes (Signed)
EPW and chest tube sutures d/c per order and protocal; slight oozing noted from L EPW site; gauze applied; pt to be bedrest until 1000; will cont. To monitor.

## 2011-12-28 ENCOUNTER — Encounter: Payer: Self-pay | Admitting: Cardiothoracic Surgery

## 2012-01-10 ENCOUNTER — Other Ambulatory Visit: Payer: Self-pay | Admitting: Cardiothoracic Surgery

## 2012-01-10 DIAGNOSIS — I2510 Atherosclerotic heart disease of native coronary artery without angina pectoris: Secondary | ICD-10-CM

## 2012-01-18 ENCOUNTER — Ambulatory Visit
Admission: RE | Admit: 2012-01-18 | Discharge: 2012-01-18 | Disposition: A | Discharge status: Home / Self Care | Payer: Medicare Other | Source: Ambulatory Visit | Attending: Cardiothoracic Surgery | Admitting: Cardiothoracic Surgery

## 2012-01-18 ENCOUNTER — Encounter: Payer: Self-pay | Admitting: Cardiothoracic Surgery

## 2012-01-18 ENCOUNTER — Ambulatory Visit (INDEPENDENT_AMBULATORY_CARE_PROVIDER_SITE_OTHER): Payer: Self-pay | Admitting: Cardiothoracic Surgery

## 2012-01-18 VITALS — BP 114/72 | HR 72 | Resp 18 | Ht 71.0 in | Wt 205.0 lb

## 2012-01-18 DIAGNOSIS — I2510 Atherosclerotic heart disease of native coronary artery without angina pectoris: Secondary | ICD-10-CM

## 2012-01-18 DIAGNOSIS — Z951 Presence of aortocoronary bypass graft: Secondary | ICD-10-CM

## 2012-01-18 DIAGNOSIS — I359 Nonrheumatic aortic valve disorder, unspecified: Secondary | ICD-10-CM

## 2012-01-18 DIAGNOSIS — Z954 Presence of other heart-valve replacement: Secondary | ICD-10-CM

## 2012-01-18 NOTE — Progress Notes (Signed)
[address]            [place] [address]          [phone]       [facility] Medical Record #[id number] Date of Birth: 06/28/1932  Runell Gess, MD Aida Puffer, MD  Chief Complaint:   PostOp Follow Up Visit  12/20/2011  DATE OF DISCHARGE:  OPERATIVE REPORT  [id number]  PREOPERATIVE DIAGNOSES: Coronary occlusive disease with left main  obstruction and aortic stenosis.  POSTOPERATIVE DIAGNOSES: Coronary occlusive disease with left main  obstruction and aortic stenosis.  SURGICAL PROCEDURE: Aortic valve replacement with pericardial tissue  valve, [employer], model 3300TFX, 23 mm, serial number [id number],  and coronary artery bypass grafting x3 with the left internal mammary to  the left anterior descending coronary artery, reverse saphenous vein  graft to the circumflex coronary artery, reverse saphenous vein graft to  the posterior descending coronary artery arising off the distal  circumflex with left thigh and calf Endovein harvesting.  History of Present Illness:     Patient 78 year old male who had aortic valve coronary artery bypass grafting done in returns to the office today for followup visit. Prior to surgery he was on Coumadin for history of pulmonary emboli more than 3 years ago, he was continued on this postoperatively because of episodic atrial fibrillation and a history of pulmonary emboli in the past. He's had no further atrial fibrillation that he's aware of. He's increased his physical activity appropriately.  He's had no complaints of congestive heart failure or angina.         History  Smoking status  . Never Smoker   Smokeless tobacco  . Never Used       Allergies  Allergen Reactions  . Sulfonamide Derivatives Rash    "I don't even remember having problem; dr says I did"    Current Outpatient Prescriptions  Medication Sig Dispense Refill  . amiodarone (PACERONE) 400 MG  tablet Take 200 mg by mouth daily. For 7 days then take 400 mg once daily      . aspirin 81 MG tablet Take 81 mg by mouth daily.      [name] atorvastatin (LIPITOR) 40 MG tablet Take 1 tablet (40 mg total) by mouth daily at 6 PM.  30 tablet  1  . lisinopril (PRINIVIL,ZESTRIL) 2.5 MG tablet Take 1 tablet (2.5 mg total) by mouth daily.  30 tablet  1  . metoprolol tartrate (LOPRESSOR) 25 MG tablet Take 25 mg by mouth 2 (two) times daily.      [name] warfarin (COUMADIN) 2.5 MG tablet Take 2.5 mg by mouth daily.      [name] DISCONTD: metoprolol tartrate (LOPRESSOR) 25 MG tablet Take 1 tablet (25 mg total) by mouth 3 (three) times daily.  90 tablet  1  . NITROSTAT 0.4 MG SL tablet       . DISCONTD: amiodarone (PACERONE) 400 MG tablet Take 1 tablet (400 mg total) by mouth 2 (two) times daily. For 7 days then take 400 mg once daily  70 tablet  1       Physical Exam: BP 114/72  Pulse 72  Resp 18  Ht 5\' 11"  (1.803 m)  Wt 205 lb (92.987 kg)  BMI 28.59 kg/m2  SpO2 96% Regular rate and rhythm, does not appear to be in atrial fibrillation today General appearance: alert and cooperative  Neurologic: intact Heart: regular rate and rhythm, S1, S2 normal, no murmur, click, rub or gallop and normal apical impulse Lungs: clear to auscultation bilaterally and normal percussion bilaterally Abdomen: soft, non-tender; bowel sounds normal; no masses,  no organomegaly Extremities: extremities normal, atraumatic, no cyanosis or edema and Homans sign is negative, no sign of DVT Wound: Sternum is stable and well healed, the left leg endo- vein harvest sites are healing well   Diagnostic Studies & Laboratory data:         Recent Radiology Findings: Dg Chest 2 View  01/18/2012  *RADIOLOGY REPORT*  Clinical Data: 3-week follow-up appointment  CHEST - 2 VIEW  Comparison: 12/23/2011  Findings:  There is moderate cardiac enlargement.  There are coarsened interstitial markings identified bilaterally.  Small bilateral pleural  effusions and interstitial prominence is noted consistent with CHF.  This is improved from previous exam.  IMPRESSION:  1.  Improving CHF pattern.  Original Report Authenticated By: Rosealee Albee, M.D.      Recent Labs: Lab Results  Component Value Date   WBC 5.3 12/25/2011   HGB 10.1* 12/25/2011   HCT 29.7* 12/25/2011   PLT 115* 12/25/2011   GLUCOSE 95 12/25/2011   CHOL  Value: 138        ATP III CLASSIFICATION:  <200     mg/dL   Desirable  161-096  mg/dL   Borderline High  >=045    mg/dL   High        4/0/9811   TRIG 60 08/29/2008   HDL 38* 08/29/2008   LDLCALC  Value: 88        Total Cholesterol/HDL:CHD Risk Coronary Heart Disease Risk Table                     Men   Women  1/2 Average Risk   3.4   3.3  Average Risk       5.0   4.4  2 X Average Risk   9.6   7.1  3 X Average Risk  23.4   11.0        Use the calculated Patient Ratio above and the CHD Risk Table to determine the patient's CHD Risk.        ATP III CLASSIFICATION (LDL):  <100     mg/dL   Optimal  914-782  mg/dL   Near or Above                    Optimal  130-159  mg/dL   Borderline  956-213  mg/dL   High  >086     mg/dL   Very High 11/28/8467   ALT 28 09/27/2009   AST 26 09/27/2009   NA 136 12/25/2011   K 4.0 12/25/2011   CL 100 12/25/2011   CREATININE 1.06 12/25/2011   BUN 23 12/25/2011   CO2 29 12/25/2011   INR 1.24 12/27/2011   HGBA1C 6.2* 12/20/2011      Assessment / Plan:     Patient is doing well following aortic valve replacement and coronary artery bypass graft. I've encouraged him to enroll in cardiac rehabilitation program. Cautioned him about any heavy lifting for 3 months. But have allowed him to return to driving. I have again reviewed with him the need for dental antibiotic prophylaxis with his prosthetic valve. I've not made him a return appointment to see me but would be glad to see him at his or Dr. Allyson Sabal requested anytime.  [name] MD 01/18/2012 3:42 PM

## 2013-02-04 ENCOUNTER — Ambulatory Visit (INDEPENDENT_AMBULATORY_CARE_PROVIDER_SITE_OTHER): Payer: Medicare Other | Admitting: Physician Assistant

## 2013-02-04 ENCOUNTER — Encounter: Payer: Self-pay | Admitting: Physician Assistant

## 2013-02-04 VITALS — BP 162/74 | HR 72 | Ht 69.5 in | Wt 205.9 lb

## 2013-02-04 DIAGNOSIS — I359 Nonrheumatic aortic valve disorder, unspecified: Secondary | ICD-10-CM

## 2013-02-04 DIAGNOSIS — I779 Disorder of arteries and arterioles, unspecified: Secondary | ICD-10-CM

## 2013-02-04 DIAGNOSIS — I10 Essential (primary) hypertension: Secondary | ICD-10-CM

## 2013-02-04 DIAGNOSIS — E785 Hyperlipidemia, unspecified: Secondary | ICD-10-CM

## 2013-02-04 DIAGNOSIS — I4891 Unspecified atrial fibrillation: Secondary | ICD-10-CM

## 2013-02-04 DIAGNOSIS — I2510 Atherosclerotic heart disease of native coronary artery without angina pectoris: Secondary | ICD-10-CM

## 2013-02-04 DIAGNOSIS — Z951 Presence of aortocoronary bypass graft: Secondary | ICD-10-CM

## 2013-02-04 DIAGNOSIS — I350 Nonrheumatic aortic (valve) stenosis: Secondary | ICD-10-CM

## 2013-02-04 DIAGNOSIS — Z7901 Long term (current) use of anticoagulants: Secondary | ICD-10-CM

## 2013-02-04 NOTE — Patient Instructions (Signed)
Eliminate sodium from your diet.  Check blood pressure once daily for the next week. It should be under 130/80. If it is consistently elevated above the call our office and we will have you come in for blood pressure check.  Follow up with Dr. Allyson Sabal in 6 Months.

## 2013-02-04 NOTE — Assessment & Plan Note (Signed)
Pressure is elevated today. Patient states that his blood pressure usually increases when he comes to the doctor's office. I have asked him to check his blood pressure once daily for the next week and it is consistently above 130/80 he is to call our office for an appointment.  He does state he continues to use salt particularly on his tomato sandwiches with cheese eating a lot of lately. I did counsel him on trying to eliminate salt from his diet completely.

## 2013-02-04 NOTE — Assessment & Plan Note (Signed)
We'll need to recheck lipid panel in 6 months since he has discontinued Lipitor.

## 2013-02-04 NOTE — Progress Notes (Signed)
Date:  02/04/2013   ID:  Curtis Brennan, DOB 27-Jun-1932, MRN [id number]  PCP:  Aida Puffer, MD  Primary Cardiologist:  Allyson Sabal     History of Present Illness: Tell Curtis Brennan is a 79 y.o. male The patient is a very pleasant 79 year old thin appearing married Caucasian male, who is accompanied by his wife today. He has a history of coronary artery bypass grafting x3 with a LIMA to his LAD, a vein to the circumflex and to the PDA as well as a tissue AVR for aortic stenosis over the summer time. His other problems include hypertension, hyperlipidemia, intolerant to statin drugs. He does have a remote history of a pulmonary emboli and has been on Coumadin anticoagulation for the last 5 years. He has known asymptomatic moderate left internal carotid artery stenosis which we are following by duplex ultrasound. Berry increased his lisinopril again in January of this year due to hypertension. He was counseled about salt intake. A 2D echo performed April 19, 2012 showed a well-functioning aortic bioprosthesis with normal LV function, and a Myoview stress test was normal as well. His most recent lipid profile performed April 12, 2012 revealed a total cholesterol of 122, LDL of 66, and HDL 42.   Patient presents today for 6 month follow.  Reports doing quite well.  He did stop taking Lipitor due to myalgias. Other than that he currently denies nausea, vomiting, fever, chest pain, shortness of breath, orthopnea, dizziness, PND, cough, congestion, abdominal pain, hematochezia, melena, lower extremity edema, claudication.  Wt Readings from Last 3 Encounters:  02/04/13 205 lb 14.4 oz (93.396 kg)  01/18/12 205 lb (92.987 kg)  12/27/11 208 lb 8.9 oz (94.6 kg)     Past Medical History  Diagnosis Date  . Prostate cancer   . Complication of anesthesia 07/1997    "just wouldn't wake wakeup"  . PONV (postoperative nausea and vomiting)   . Coronary artery disease   . Hypertension   . Heart murmur   .  Angina   . Pulmonary embolism ~ 2011    "both lungs"  . Legionnaire's disease 1986  . Pneumonia     "once"  . Exertional dyspnea   . Kidney stones   . Unstable angina 12/15/2011  . CAD (coronary artery disease) with history of LAD & Diag athrectomy in 1994, now with severe 3 vesseal CAD by cath 12/14/11 for CABG 12/15/2011  . AS (aortic stenosis) moderate to severe by cath 12/14/11 for AVR  12/15/2011  . Chronic anticoagulation, on coumadin for pul emboli 12/15/2011  . Carotid artery disease, bilateral 12/15/2011    Current Outpatient Prescriptions  Medication Sig Dispense Refill  . aspirin 81 MG tablet Take 81 mg by mouth daily.      [name] lisinopril (PRINIVIL,ZESTRIL) 20 MG tablet Take 20 mg by mouth daily.      . metoprolol tartrate (LOPRESSOR) 25 MG tablet Take 25 mg by mouth 2 (two) times daily.      [name] NITROSTAT 0.4 MG SL tablet       . warfarin (COUMADIN) 2.5 MG tablet Take 2.5 mg by mouth daily.       No current facility-administered medications for this visit.    Allergies:    Allergies  Allergen Reactions  . Atorvastatin     myalgias  . Sulfonamide Derivatives Rash    "I don't even remember having problem; dr says I did"    Social History:  The patient  reports that he has never smoked. He has  never used smokeless tobacco. He reports that he does not drink alcohol or use illicit drugs.   Family history:  History reviewed. No pertinent family history.  ROS:  Please see the history of present illness.  All other systems reviewed and negative.   PHYSICAL EXAM: VS:  BP 162/74  Pulse 72  Ht 5' 9.5" (1.765 m)  Wt 205 lb 14.4 oz (93.396 kg)  BMI 29.98 kg/m2 Well nourished, well developed, in no acute distress HEENT: Pupils are equal round react to light accommodation extraocular movements are intact.  Neck: no JVDNo cervical lymphadenopathy. Cardiac: Regular rate and rhythm with 1/6 sys murmur. No rubs or gallops. Lungs:  clear to auscultation bilaterally, no wheezing,  rhonchi or rales Abd: soft, nontender, positive bowel sounds all quadrants, no hepatosplenomegaly Ext: 1+ ankle edema.  2+ radial and dorsalis pedis pulses. Skin: warm and dry Neuro:  Grossly normal  EKG:  Normal sinus rhythm rate 72 BPM    ASSESSMENT AND PLAN:  Problem List Items Addressed This Visit   S/P CABG x 3: LIMA to LAD, SVG to PD, SVG to OM.    HYPERTENSION     Pressure is elevated today. Patient states that his blood pressure usually increases when he comes to the doctor's office. I have asked him to check his blood pressure once daily for the next week and it is consistently above 130/80 he is to call our office for an appointment.  He does state he continues to use salt particularly on his tomato sandwiches with cheese eating a lot of lately. I did counsel him on trying to eliminate salt from his diet completely.    Relevant Medications      lisinopril (PRINIVIL,ZESTRIL) 20 MG tablet   HYPERLIPIDEMIA     We'll need to recheck lipid panel in 6 months since he has discontinued Lipitor.    Relevant Medications      lisinopril (PRINIVIL,ZESTRIL) 20 MG tablet   Chronic anticoagulation, on coumadin for pul emboli (Chronic)   Carotid artery disease, bilateral, asymptomatic (Chronic)   Relevant Medications      lisinopril (PRINIVIL,ZESTRIL) 20 MG tablet   CAD, CABG X 3 12/20/11   Relevant Medications      lisinopril (PRINIVIL,ZESTRIL) 20 MG tablet   Other Relevant Orders      EKG 12-Lead   AS, s/p tissue AVR 12/20/11   Relevant Medications      lisinopril (PRINIVIL,ZESTRIL) 20 MG tablet   AF, post CABG/AVR - Primary   Relevant Medications      lisinopril (PRINIVIL,ZESTRIL) 20 MG tablet

## 2013-02-25 ENCOUNTER — Other Ambulatory Visit: Payer: Self-pay | Admitting: *Deleted

## 2013-02-25 MED ORDER — LISINOPRIL 20 MG PO TABS: 20.0000 mg | ORAL_TABLET | Freq: Every day | ORAL | Status: DC

## 2013-02-25 NOTE — Telephone Encounter (Signed)
Rx was sent to pharmacy electronically. 

## 2013-05-20 ENCOUNTER — Other Ambulatory Visit (HOSPITAL_COMMUNITY): Payer: Self-pay | Admitting: Cardiovascular Disease

## 2013-05-27 ENCOUNTER — Ambulatory Visit (HOSPITAL_COMMUNITY)
Admission: RE | Admit: 2013-05-27 | Discharge: 2013-05-27 | Disposition: A | Discharge status: Home / Self Care | Payer: Medicare Other | Source: Ambulatory Visit | Attending: Cardiovascular Disease | Admitting: Cardiovascular Disease

## 2013-05-27 DIAGNOSIS — I6529 Occlusion and stenosis of unspecified carotid artery: Secondary | ICD-10-CM

## 2013-05-27 NOTE — Progress Notes (Signed)
Carotid Duplex Completed. Saylee Sherrill, BS, RDMS, RVT  

## 2013-06-01 ENCOUNTER — Encounter: Payer: Self-pay | Admitting: *Deleted

## 2013-06-01 ENCOUNTER — Telehealth: Payer: Self-pay | Admitting: *Deleted

## 2013-06-01 DIAGNOSIS — I6529 Occlusion and stenosis of unspecified carotid artery: Secondary | ICD-10-CM

## 2013-06-01 NOTE — Telephone Encounter (Signed)
Order placed for repeat carotid dopplers in 1 year  

## 2013-06-01 NOTE — Telephone Encounter (Signed)
Message copied by Marella Bile on Sun Jun 01, 2013  7:20 PM ------      Message from: Runell Gess      Created: Thu May 29, 2013  1:30 PM       No change from prior study. Repeat in 12 months. ------

## 2013-08-05 ENCOUNTER — Encounter: Payer: Self-pay | Admitting: Cardiovascular Disease

## 2013-08-05 ENCOUNTER — Ambulatory Visit (INDEPENDENT_AMBULATORY_CARE_PROVIDER_SITE_OTHER): Payer: Medicare Other | Admitting: Cardiovascular Disease

## 2013-08-05 VITALS — BP 130/76 | HR 80 | Ht 69.5 in | Wt 209.0 lb

## 2013-08-05 DIAGNOSIS — I10 Essential (primary) hypertension: Secondary | ICD-10-CM

## 2013-08-05 DIAGNOSIS — Z79899 Other long term (current) drug therapy: Secondary | ICD-10-CM

## 2013-08-05 DIAGNOSIS — Z952 Presence of prosthetic heart valve: Secondary | ICD-10-CM

## 2013-08-05 DIAGNOSIS — E785 Hyperlipidemia, unspecified: Secondary | ICD-10-CM

## 2013-08-05 DIAGNOSIS — I739 Peripheral vascular disease, unspecified: Secondary | ICD-10-CM

## 2013-08-05 DIAGNOSIS — I2510 Atherosclerotic heart disease of native coronary artery without angina pectoris: Secondary | ICD-10-CM

## 2013-08-05 DIAGNOSIS — Z954 Presence of other heart-valve replacement: Secondary | ICD-10-CM

## 2013-08-05 DIAGNOSIS — I779 Disorder of arteries and arterioles, unspecified: Secondary | ICD-10-CM

## 2013-08-05 MED ORDER — NITROGLYCERIN 0.4 MG SL SUBL: 0.4000 mg | SUBLINGUAL_TABLET | SUBLINGUAL | Status: DC | PRN

## 2013-08-05 MED ORDER — METOPROLOL TARTRATE 25 MG PO TABS: 25.0000 mg | ORAL_TABLET | Freq: Two times a day (BID) | ORAL | Status: DC

## 2013-08-05 MED ORDER — LISINOPRIL 20 MG PO TABS: 20.0000 mg | ORAL_TABLET | Freq: Every day | ORAL | Status: DC

## 2013-08-05 NOTE — Patient Instructions (Signed)
  Your physician wants you to follow-up with him in : 1 year with Dr Gwenlyn Found                                            and with an extender in : 6 months with [name] [facility]                    You will receive a reminder letter in the mail one month in advance. If you don't receive a letter, please call our office to schedule the follow-up appointment.   Your physician recommends that you return for lab work fasting.    Your physician has ordered the following tests: echocardiogram

## 2013-08-05 NOTE — Progress Notes (Signed)
08/05/2013 [name]   [birth year]  [id number]  Primary Physician [name], MD Primary Cardiologist: [name] MD Renae Gloss   HPI:  The patient is a very pleasant 80 year old thin appearing married Caucasian male, father of 23, grandfather to 74 and great grandfather to 62 grandchildren who is accompanied by his wife today. I last saw him 4 months ago. He has a history of coronary artery bypass grafting x3 with a LIMA to his LAD, a vein to the circumflex and to the PDA as well as a tissue AVR for aortic stenosis over the summer time. His other problems include hypertension, hyperlipidemia, intolerant to statin drugs. He does have a remote history of a pulmonary emboli and has been on Coumadin anticoagulation for the last 5 years. He has known asymptomatic moderate left internal carotid artery stenosis which we are following by duplex ultrasound. He saw [name] in the office July 05, 2012 because of hypertension and his lisinopril was adjusted. He was counseled about salt intake. A 2D echo performed April 19, 2012 showed a well-functioning aortic bioprosthesis with normal LV function, and a Myoview stress test was normal as well. His most recent lipid profile performed April 12, 2012 revealed a total cholesterol of 122, LDL of 66, and HDL 42.his primary care physician discontinued his back and medication because of side effects. He saw Tenny Craw PA-C in our office in July for six-month followup and had been doing well. He denies chest pain or shortness of breath. Carotid Dopplers performed in November showed moderate left internal carotid artery stenosis which had remained stable.     Current Outpatient Prescriptions  Medication Sig Dispense Refill  . aspirin 81 MG tablet Take 81 mg by mouth daily.      [name] lisinopril (PRINIVIL,ZESTRIL) 20 MG tablet Take 1 tablet (20 mg total) by mouth daily.  30 tablet  11  . metoprolol tartrate (LOPRESSOR) 25 MG tablet  Take 25 mg by mouth 2 (two) times daily.      [name] NITROSTAT 0.4 MG SL tablet       . warfarin (COUMADIN) 2.5 MG tablet Take 2.5 mg by mouth daily.       No current facility-administered medications for this visit.    Allergies  Allergen Reactions  . Atorvastatin     myalgias  . Sulfonamide Derivatives Rash    "I don't even remember having problem; dr says I did"    History   Social History  . Marital Status: Married    Spouse Name: N/A    Number of Children: N/A  . Years of Education: N/A   Occupational History  . Not on file.   Social History Main Topics  . Smoking status: Never Smoker   . Smokeless tobacco: Never Used  . Alcohol Use: No  . Drug Use: No  . Sexual Activity: Yes   Other Topics Concern  . Not on file   Social History Narrative  . No narrative on file     Review of Systems: General: negative for chills, fever, night sweats or weight changes.  Cardiovascular: negative for chest pain, dyspnea on exertion, edema, orthopnea, palpitations, paroxysmal nocturnal dyspnea or shortness of breath Dermatological: negative for rash Respiratory: negative for cough or wheezing Urologic: negative for hematuria Abdominal: negative for nausea, vomiting, diarrhea, bright red blood per rectum, melena, or hematemesis Neurologic: negative for visual changes, syncope, or dizziness All other systems reviewed and are otherwise negative except as noted above.  Blood pressure 130/76, pulse 80, height 5' 9.5" (1.765 m), weight 209 lb (94.802 kg).  General appearance: alert and no distress Neck: no adenopathy, no carotid bruit, no JVD, supple, symmetrical, trachea midline and thyroid not enlarged, symmetric, no tenderness/mass/nodules Lungs: clear to auscultation bilaterally Heart: regular rate and rhythm, S1, S2 normal, no murmur, click, rub or gallop Extremities: extremities normal, atraumatic, no cyanosis or edema  EKG normal sinus rhythm at 80 without ST or T wave  changes  ASSESSMENT AND PLAN:   CAD, CABG X 3 12/20/11 Status post LAD and diagonal branch correctional atherectomy by myself July of 1994. He had relook cath May 2001 and July 2001 showing patent vessels. He ultimately underwent coronary bypass grafting x3 with a LIMA to his LAD, vein to a circumflex and PDA. He also had tissue aVR for aortic stenosis. We will follow his 2-D echo ideally for his bioprosthetic valve. His last Myoview stress test performed 04/19/12 was normal. He denies chest pain or shortness of breath.  Carotid artery disease, bilateral, asymptomatic The patient has moderate left internal carotid artery stenosis which we followed in our office but he ultrasound on an annual basis. He was last checked in November and was has remained stable. He was neurologically asymptomatic.  HYPERTENSION Hypertension well controlled on current medications  HYPERLIPIDEMIA The patient has gotten taller. His Lipitor stoplights primary care physician. We'll recheck a fasting lipid and liver profile  Chronic anticoagulation, on coumadin for pul emboli His INRs are followed by his primary care physician      [name] MD [facility], [facility] 08/05/2013 10:58 AM

## 2013-08-05 NOTE — Assessment & Plan Note (Signed)
His INRs are followed by his primary care physician

## 2013-08-05 NOTE — Assessment & Plan Note (Signed)
Status post LAD and diagonal branch correctional atherectomy by myself July of 1994. He had relook cath May 2001 and July 2001 showing patent vessels. He ultimately underwent coronary bypass grafting x3 with a LIMA to his LAD, vein to a circumflex and PDA. He also had tissue aVR for aortic stenosis. We will follow his 2-D echo ideally for his bioprosthetic valve. His last Myoview stress test performed 04/19/12 was normal. He denies chest pain or shortness of breath.

## 2013-08-05 NOTE — Assessment & Plan Note (Signed)
Hypertension well controlled on current medications 

## 2013-08-05 NOTE — Assessment & Plan Note (Signed)
The patient has gotten taller. His Lipitor stoplights primary care physician. We'll recheck a fasting lipid and liver profile

## 2013-08-05 NOTE — Assessment & Plan Note (Signed)
The patient has moderate left internal carotid artery stenosis which we followed in our office but he ultrasound on an annual basis. He was last checked in November and was has remained stable. He was neurologically asymptomatic.

## 2013-08-19 ENCOUNTER — Ambulatory Visit (HOSPITAL_COMMUNITY)
Admission: RE | Admit: 2013-08-19 | Discharge: 2013-08-19 | Disposition: A | Discharge status: Home / Self Care | Payer: Medicare Other | Source: Ambulatory Visit | Attending: Cardiology | Admitting: Cardiology

## 2013-08-19 DIAGNOSIS — Z954 Presence of other heart-valve replacement: Secondary | ICD-10-CM | POA: Insufficient documentation

## 2013-08-19 DIAGNOSIS — Z952 Presence of prosthetic heart valve: Secondary | ICD-10-CM

## 2013-08-19 DIAGNOSIS — Z951 Presence of aortocoronary bypass graft: Secondary | ICD-10-CM | POA: Insufficient documentation

## 2013-08-19 DIAGNOSIS — I059 Rheumatic mitral valve disease, unspecified: Secondary | ICD-10-CM

## 2013-08-19 DIAGNOSIS — I2510 Atherosclerotic heart disease of native coronary artery without angina pectoris: Secondary | ICD-10-CM | POA: Insufficient documentation

## 2013-08-19 DIAGNOSIS — E785 Hyperlipidemia, unspecified: Secondary | ICD-10-CM | POA: Insufficient documentation

## 2013-08-19 DIAGNOSIS — I10 Essential (primary) hypertension: Secondary | ICD-10-CM | POA: Insufficient documentation

## 2013-08-19 LAB — HEPATIC FUNCTION PANEL
ALK PHOS: 48 U/L (ref 39–117)
ALT: 23 U/L (ref 0–53)
AST: 24 U/L (ref 0–37)
Albumin: 4 g/dL (ref 3.5–5.2)
BILIRUBIN DIRECT: 0.2 mg/dL (ref 0.0–0.3)
BILIRUBIN INDIRECT: 0.9 mg/dL (ref 0.0–0.9)
BILIRUBIN TOTAL: 1.1 mg/dL (ref 0.3–1.2)
Total Protein: 6.6 g/dL (ref 6.0–8.3)

## 2013-08-19 LAB — LIPID PANEL
CHOL/HDL RATIO: 4 ratio
CHOLESTEROL: 174 mg/dL (ref 0–200)
HDL: 43 mg/dL (ref >39)
LDL Cholesterol: 103 mg/dL — ABNORMAL HIGH (ref 0–99)
Triglycerides: 139 mg/dL (ref <150)
VLDL: 28 mg/dL (ref 0–40)

## 2013-08-19 NOTE — Progress Notes (Signed)
2D Echo Performed 08/19/2013    Khylee Algeo, RCS  

## 2013-08-26 ENCOUNTER — Encounter: Payer: Self-pay | Admitting: *Deleted

## 2013-09-01 ENCOUNTER — Telehealth: Payer: Self-pay | Admitting: Cardiovascular Disease

## 2013-09-01 NOTE — Telephone Encounter (Signed)
Returned call and pt verified x 2.  Pt informed message received and results recently reviewed by Dr. Gwenlyn Found.  Result given per result note.  Pt stated his bad cholesterol has always been mildly elevated and he cannot take -statins.  Stated he has tried all of them and they just cause his legs to hurt so bad.  Pt informed Dr. Gwenlyn Found and Curt Bears, his nurse, are out of the office today, but will be notified to review and let him know what to do next.     Message forwarded to K. Donne Hazel, RN to discuss w/ Dr. Gwenlyn Found.

## 2013-09-01 NOTE — Telephone Encounter (Signed)
Patient has not received his lab results from 2 weeks ago---also wants a copy mailed to him.

## 2013-09-08 NOTE — Telephone Encounter (Signed)
Per Dr Gwenlyn Found...try crestor 5mg  QOD and co Q 10.    I called patient.  He says that he has tried Crestor in the past and could not tolerate it.  He says that he has tried 4-5 cholesterol medications and he doesn't want to try anymore. I offered him zetia and he was not interested. He wants to continue his current medication and treatment plan.

## 2014-04-28 ENCOUNTER — Telehealth: Payer: Self-pay | Admitting: Cardiovascular Disease

## 2014-04-28 MED ORDER — AMOXICILLIN 500 MG PO CAPS: ORAL_CAPSULE | ORAL | Status: DC

## 2014-04-28 NOTE — Telephone Encounter (Signed)
Pt says he is going to have his tooth filled tomorrow.He wants to know if he needs an antibotic?

## 2014-04-28 NOTE — Telephone Encounter (Signed)
Forward to DR Collie Siad RN

## 2014-04-28 NOTE — Telephone Encounter (Signed)
He has a tissue aortic valve. He will need antibiotic prophylaxis for his dental procedure

## 2014-04-28 NOTE — Telephone Encounter (Signed)
Patient notified of the need to take antibiotics prior to dental visit.  He verbalized understanding and I sent the RX in electronically.

## 2014-05-07 ENCOUNTER — Telehealth (HOSPITAL_COMMUNITY): Payer: Self-pay | Admitting: *Deleted

## 2014-05-13 ENCOUNTER — Telehealth (HOSPITAL_COMMUNITY): Payer: Self-pay | Admitting: *Deleted

## 2014-05-13 ENCOUNTER — Telehealth: Payer: Self-pay | Admitting: *Deleted

## 2014-05-13 NOTE — Telephone Encounter (Signed)
Message copied by Chauncy Lean on Wed May 13, 2014  3:52 PM ------      Message from: Miquel Dunn D      Created: Wed May 13, 2014 10:49 AM       FYI      Pt has a recall in for carotids in November. He does not want to have them done because he says the results have not changed in the past 5 years.            Thanks      Ebony ------

## 2014-07-02 ENCOUNTER — Encounter (HOSPITAL_COMMUNITY): Payer: Self-pay | Admitting: Cardiovascular Disease

## 2014-07-07 ENCOUNTER — Other Ambulatory Visit (HOSPITAL_COMMUNITY): Payer: Self-pay | Admitting: Cardiovascular Disease

## 2014-07-07 DIAGNOSIS — I6529 Occlusion and stenosis of unspecified carotid artery: Secondary | ICD-10-CM

## 2014-07-20 ENCOUNTER — Ambulatory Visit (HOSPITAL_COMMUNITY)
Admission: RE | Admit: 2014-07-20 | Discharge: 2014-07-20 | Disposition: A | Discharge status: Home / Self Care | Payer: Medicare Other | Source: Ambulatory Visit | Attending: Cardiovascular Disease | Admitting: Cardiovascular Disease

## 2014-07-20 DIAGNOSIS — I779 Disorder of arteries and arterioles, unspecified: Secondary | ICD-10-CM

## 2014-07-20 DIAGNOSIS — I6523 Occlusion and stenosis of bilateral carotid arteries: Secondary | ICD-10-CM | POA: Insufficient documentation

## 2014-07-20 DIAGNOSIS — I10 Essential (primary) hypertension: Secondary | ICD-10-CM | POA: Insufficient documentation

## 2014-07-20 DIAGNOSIS — I2510 Atherosclerotic heart disease of native coronary artery without angina pectoris: Secondary | ICD-10-CM | POA: Insufficient documentation

## 2014-07-20 DIAGNOSIS — E785 Hyperlipidemia, unspecified: Secondary | ICD-10-CM | POA: Diagnosis not present

## 2014-07-20 DIAGNOSIS — I672 Cerebral atherosclerosis: Secondary | ICD-10-CM | POA: Insufficient documentation

## 2014-07-20 DIAGNOSIS — Z951 Presence of aortocoronary bypass graft: Secondary | ICD-10-CM | POA: Diagnosis not present

## 2014-07-20 DIAGNOSIS — I6529 Occlusion and stenosis of unspecified carotid artery: Secondary | ICD-10-CM

## 2014-07-20 NOTE — Progress Notes (Signed)
Carotid Duplex Completed. Xsavier Seeley, BS, RDMS, RVT  

## 2014-08-07 ENCOUNTER — Telehealth: Payer: Self-pay | Admitting: Cardiovascular Disease

## 2014-08-07 NOTE — Telephone Encounter (Signed)
Pt would like results from his doppler he had in December.

## 2014-08-07 NOTE — Telephone Encounter (Signed)
Spoke with pt, aware of carotid doppler results.

## 2014-08-10 ENCOUNTER — Encounter: Payer: Self-pay | Admitting: *Deleted

## 2014-08-31 ENCOUNTER — Other Ambulatory Visit: Payer: Self-pay | Admitting: Cardiovascular Disease

## 2014-08-31 NOTE — Telephone Encounter (Signed)
Rx(s) sent to pharmacy electronically. Message sent to [facility], Dr. Kennon Holter scheduler to contact patient for appointment.

## 2014-09-01 ENCOUNTER — Telehealth: Payer: Self-pay | Admitting: Cardiovascular Disease

## 2014-09-02 NOTE — Telephone Encounter (Signed)
Closed encounter °

## 2014-09-29 ENCOUNTER — Encounter: Payer: Self-pay | Admitting: *Deleted

## 2014-10-14 ENCOUNTER — Ambulatory Visit (INDEPENDENT_AMBULATORY_CARE_PROVIDER_SITE_OTHER): Payer: Medicare Other | Admitting: Cardiovascular Disease

## 2014-10-14 ENCOUNTER — Encounter: Payer: Self-pay | Admitting: Cardiovascular Disease

## 2014-10-14 VITALS — BP 120/72 | HR 74 | Ht 69.0 in | Wt 207.0 lb

## 2014-10-14 DIAGNOSIS — I359 Nonrheumatic aortic valve disorder, unspecified: Secondary | ICD-10-CM | POA: Diagnosis not present

## 2014-10-14 DIAGNOSIS — I10 Essential (primary) hypertension: Secondary | ICD-10-CM

## 2014-10-14 DIAGNOSIS — I779 Disorder of arteries and arterioles, unspecified: Secondary | ICD-10-CM | POA: Diagnosis not present

## 2014-10-14 DIAGNOSIS — I739 Peripheral vascular disease, unspecified: Secondary | ICD-10-CM

## 2014-10-14 NOTE — Assessment & Plan Note (Signed)
History of carotid artery disease status post duplex ultrasound 07/20/14 revealing moderate stable left ICA stenosis.

## 2014-10-14 NOTE — Assessment & Plan Note (Signed)
History of tissue AVR 12/20/11 followed by annual 2-D echocardiograms.

## 2014-10-14 NOTE — Assessment & Plan Note (Signed)
History of coronary artery disease status post bypass grafting 12/20/11 with a LIMA to the LAD, vein to circumflex and PDA along with tissue AVR for aortic stenosis. His last Myoview performed 04/19/12 was normal. He denies chest pain or shortness of breath.

## 2014-10-14 NOTE — Patient Instructions (Signed)

## 2014-10-14 NOTE — Assessment & Plan Note (Signed)
History of hypertension blood pressure today 120/72. He is on lisinopril and metoprolol. Continue current meds at current dosing

## 2014-10-14 NOTE — Progress Notes (Signed)
10/14/2014 Curtis Brennan   1931/09/08  [id number]  Primary Physician [name], MD Primary Cardiologist: [name] MD Renae Gloss   HPI:  The patient is a very pleasant 81 year old thin appearing married Caucasian male, father of 57, grandfather to 28 and great grandfather to 83 grandchildren who is accompanied by his daughter today. I last saw him 14 months ago. He has a history of coronary artery bypass grafting x3 with a LIMA to his LAD, a vein to the circumflex and to the PDA as well as a tissue AVR for aortic stenosis over the summer time. His other problems include hypertension, hyperlipidemia, intolerant to statin drugs. He does have a remote history of a pulmonary emboli and has been on Coumadin anticoagulation for the last 5 years. He has known asymptomatic moderate left internal carotid artery stenosis which we are following by duplex ultrasound. He saw Curtis Brennan in the office July 05, 2012 because of hypertension and his lisinopril was adjusted. He was counseled about salt intake. A 2D echo performed April 19, 2012 showed a well-functioning aortic bioprosthesis with normal LV function, and a Myoview stress test was normal as well. His most recent lipid profile performed April 12, 2012 revealed a total cholesterol of 122, LDL of 66, and HDL 42. He is statin intolerant. Since I saw him a year ago he's remained currently stable denying chest pain or shortness of breath.  Current Outpatient Prescriptions  Medication Sig Dispense Refill  . amoxicillin (AMOXIL) 500 MG capsule Take 4 tablets one hour prior to dental appointment 4 capsule 1  . aspirin 81 MG tablet Take 81 mg by mouth daily.    [name] lisinopril (PRINIVIL,ZESTRIL) 20 MG tablet Take 1 tablet (20 mg total) by mouth daily. <please make appointment for refills> 90 tablet 0  . metoprolol tartrate (LOPRESSOR) 25 MG tablet Take 1 tablet (25 mg total) by mouth 2 (two) times daily. <please make appointment  for refills> 180 tablet 0  . nitroGLYCERIN (NITROSTAT) 0.4 MG SL tablet Place 1 tablet (0.4 mg total) under the tongue every 5 (five) minutes as needed for chest pain. 25 tablet 11  . warfarin (COUMADIN) 2.5 MG tablet Take 2.5 mg by mouth daily.     No current facility-administered medications for this visit.    Allergies  Allergen Reactions  . Atorvastatin     myalgias  . Sulfonamide Derivatives Rash    "I don't even remember having problem; dr says I did"    History   Social History  . Marital Status: Married    Spouse Name: N/A  . Number of Children: N/A  . Years of Education: N/A   Occupational History  . Not on file.   Social History Main Topics  . Smoking status: Never Smoker   . Smokeless tobacco: Never Used  . Alcohol Use: No  . Drug Use: No  . Sexual Activity: Yes   Other Topics Concern  . Not on file   Social History Narrative     Review of Systems: General: negative for chills, fever, night sweats or weight changes.  Cardiovascular: negative for chest pain, dyspnea on exertion, edema, orthopnea, palpitations, paroxysmal nocturnal dyspnea or shortness of breath Dermatological: negative for rash Respiratory: negative for cough or wheezing Urologic: negative for hematuria Abdominal: negative for nausea, vomiting, diarrhea, bright red blood per rectum, melena, or hematemesis Neurologic: negative for visual changes, syncope, or dizziness All other systems reviewed and are otherwise negative except as noted above.  Blood pressure 120/72, pulse 74, height 5\' 9"  (1.753 m), weight 207 lb (93.895 kg).  General appearance: alert and no distress Neck: no adenopathy, no JVD, supple, symmetrical, trachea midline, thyroid not enlarged, symmetric, no tenderness/mass/nodules and soft left carotid bruit Lungs: clear to auscultation bilaterally Heart: regular rate and rhythm, S1, S2 normal, no murmur, click, rub or gallop Extremities: extremities normal, atraumatic,  no cyanosis or edema  EKG normal sinus rhythm at 74 without ST or T-wave changes. I personally reviewed this EKG  ASSESSMENT AND PLAN:   S/P CABG x 3: LIMA to LAD, SVG to PD, SVG to OM.  History of coronary artery disease status post bypass grafting 12/20/11 with a LIMA to the LAD, vein to circumflex and PDA along with tissue AVR for aortic stenosis. His last Myoview performed 04/19/12 was normal. He denies chest pain or shortness of breath.   Essential hypertension History of hypertension blood pressure today 120/72. He is on lisinopril and metoprolol. Continue current meds at current dosing   AS, s/p tissue AVR 12/20/11 History of tissue AVR 12/20/11 followed by annual 2-D echocardiograms.   Carotid artery disease, bilateral, asymptomatic History of carotid artery disease status post duplex ultrasound 07/20/14 revealing moderate stable left ICA stenosis.   History of prior pulm embolism History of pulmonary embolus and remotely on anticoagulation which is followed by his primary care physician       [name] MD [facility], [facility] 10/14/2014 2:57 PM

## 2014-10-14 NOTE — Assessment & Plan Note (Signed)
History of pulmonary embolus and remotely on anticoagulation which is followed by his primary care physician

## 2014-10-19 ENCOUNTER — Ambulatory Visit (HOSPITAL_COMMUNITY)
Admission: RE | Admit: 2014-10-19 | Discharge: 2014-10-19 | Disposition: A | Discharge status: Home / Self Care | Payer: Medicare Other | Source: Ambulatory Visit | Attending: Cardiovascular Disease | Admitting: Cardiovascular Disease

## 2014-10-19 DIAGNOSIS — I359 Nonrheumatic aortic valve disorder, unspecified: Secondary | ICD-10-CM | POA: Insufficient documentation

## 2014-10-19 NOTE — Progress Notes (Signed)
2D Echocardiogram Complete.  10/19/2014   Star Cheese, RDCS

## 2014-10-20 ENCOUNTER — Encounter: Payer: Self-pay | Admitting: *Deleted

## 2014-10-23 ENCOUNTER — Other Ambulatory Visit: Payer: Self-pay | Admitting: Cardiovascular Disease

## 2014-10-23 NOTE — Telephone Encounter (Signed)
Rx(s) sent to pharmacy electronically.  

## 2014-11-25 ENCOUNTER — Other Ambulatory Visit: Payer: Self-pay | Admitting: Cardiovascular Disease

## 2014-11-25 NOTE — Telephone Encounter (Signed)
Rx(s) sent to pharmacy electronically.  

## 2014-12-07 ENCOUNTER — Other Ambulatory Visit: Payer: Self-pay | Admitting: Cardiovascular Disease

## 2014-12-07 NOTE — Telephone Encounter (Signed)
Rx(s) sent to pharmacy electronically.  

## 2015-08-26 ENCOUNTER — Other Ambulatory Visit: Payer: Self-pay | Admitting: Cardiovascular Disease

## 2015-09-14 ENCOUNTER — Encounter: Payer: Self-pay | Admitting: Cardiovascular Disease

## 2015-09-14 ENCOUNTER — Ambulatory Visit (INDEPENDENT_AMBULATORY_CARE_PROVIDER_SITE_OTHER): Payer: Medicare Other | Admitting: Cardiovascular Disease

## 2015-09-14 VITALS — BP 152/80 | HR 72 | Ht 69.0 in | Wt 203.0 lb

## 2015-09-14 DIAGNOSIS — I350 Nonrheumatic aortic (valve) stenosis: Secondary | ICD-10-CM | POA: Diagnosis not present

## 2015-09-14 DIAGNOSIS — I779 Disorder of arteries and arterioles, unspecified: Secondary | ICD-10-CM

## 2015-09-14 DIAGNOSIS — I10 Essential (primary) hypertension: Secondary | ICD-10-CM | POA: Diagnosis not present

## 2015-09-14 DIAGNOSIS — I739 Peripheral vascular disease, unspecified: Secondary | ICD-10-CM

## 2015-09-14 NOTE — Patient Instructions (Signed)
Medication Instructions:  Your physician recommends that you continue on your current medications as directed. Please refer to the Current Medication list given to you today.   Labwork: Your physician recommends that you return for lab work in: Fasting (lipid/liver) The lab can be found on the FIRST FLOOR of out building in [address]   Testing/Procedures: Your physician has requested that you have an echocardiogram. Echocardiography is a painless test that uses sound waves to create images of your heart. It provides your doctor with information about the size and shape of your heart and how well your heart's chambers and valves are working. This procedure takes approximately one hour. There are no restrictions for this procedure.  Your physician has requested that you have a carotid duplex. This test is an ultrasound of the carotid arteries in your neck. It looks at blood flow through these arteries that supply the brain with blood. Allow one hour for this exam. There are no restrictions or special instructions.    Follow-Up: Your physician wants you to follow-up in: 12 months with Dr. Gwenlyn Found. You will receive a reminder letter in the mail two months in advance. If you don't receive a letter, please call our office to schedule the follow-up appointment.   Any Other Special Instructions Will Be Listed Below (If Applicable).     If you need a refill on your cardiac medications before your next appointment, please call your pharmacy.

## 2015-09-14 NOTE — Assessment & Plan Note (Signed)
History of aortic stenosis status post tissue aVR at the time of coronary artery bypass grafting 12/20/11 echo performed one year ago that showed normal LV function with a well-functioning functioning aortic bioprosthesis. We will recheck a 2-D echocardiogram

## 2015-09-14 NOTE — Assessment & Plan Note (Signed)
History of carotid artery disease with moderate left ICA stenosis by 2 puffs ultrasound a year ago. He is neurologically asymptomatic. We will recheck this

## 2015-09-14 NOTE — Assessment & Plan Note (Signed)
History of coronary artery disease status post coronary artery bypass grafting X 3 with a LIMA to his LAD, vein to the circumflex and PDA. A Myoview stress test performed 04/19/12 was normal. He denies chest pain or shortness of breath

## 2015-09-14 NOTE — Progress Notes (Signed)
09/14/2015 Curtis Brennan   [birth year]  [id number]  Primary Physician [name], MD Primary Cardiologist: [name] MD Renae Gloss   HPI:  The patient is a very pleasant 82 year old thin appearing married Caucasian male, father of 67, grandfather to 71 and great grandfather to 50 grandchildren who is accompanied by his daughter today. I last saw him 10/14/14. He has a history of coronary artery bypass grafting x3 with a LIMA to his LAD, a vein to the circumflex and to the PDA as well as a tissue AVR for aortic stenosis over the summer time. His other problems include hypertension, hyperlipidemia, intolerant to statin drugs. He does have a remote history of a pulmonary emboli and has been on Coumadin anticoagulation for the last 5 years. He has known asymptomatic moderate left internal carotid artery stenosis which we are following by duplex ultrasound. He saw [name] in the office July 05, 2012 because of hypertension and his lisinopril was adjusted. He was counseled about salt intake. A 2D echo performed 10/19/14 showed a well-functioning aortic bioprosthesis with normal LV function, and a Myoview stress test was normal as well.  Since I saw him a year ago he's remained currently stable denying chest pain or shortness of breath.   Current Outpatient Prescriptions  Medication Sig Dispense Refill  . amoxicillin (AMOXIL) 500 MG capsule Take 4 tablets one hour prior to dental appointment 4 capsule 1  . aspirin 81 MG tablet Take 81 mg by mouth daily.    [name] lisinopril (PRINIVIL,ZESTRIL) 20 MG tablet TAKE 1 TABLET BY MOUTH DAILY 90 tablet 0  . metoprolol tartrate (LOPRESSOR) 25 MG tablet Take 1 tablet (25 mg total) by mouth 2 (two) times daily. 180 tablet 3  . NITROSTAT 0.4 MG SL tablet TAKE 1 TABLET UNDER THE TONGUE AS NEEDEDFOR CHEST PAIN (MAY REPEAT EVERY 5 MIN X3 DOSES, CALL 911 IF NO RELIEF.) 25 tablet 3  . warfarin (COUMADIN) 2.5 MG tablet Take 2.5 mg by mouth daily.       No current facility-administered medications for this visit.    Allergies  Allergen Reactions  . Atorvastatin     myalgias  . Sulfonamide Derivatives Rash    "I don't even remember having problem; dr says I did"    Social History   Social History  . Marital Status: Married    Spouse Name: N/A  . Number of Children: N/A  . Years of Education: N/A   Occupational History  . Not on file.   Social History Main Topics  . Smoking status: Never Smoker   . Smokeless tobacco: Never Used  . Alcohol Use: No  . Drug Use: No  . Sexual Activity: Yes   Other Topics Concern  . Not on file   Social History Narrative     Review of Systems: General: negative for chills, fever, night sweats or weight changes.  Cardiovascular: negative for chest pain, dyspnea on exertion, edema, orthopnea, palpitations, paroxysmal nocturnal dyspnea or shortness of breath Dermatological: negative for rash Respiratory: negative for cough or wheezing Urologic: negative for hematuria Abdominal: negative for nausea, vomiting, diarrhea, bright red blood per rectum, melena, or hematemesis Neurologic: negative for visual changes, syncope, or dizziness All other systems reviewed and are otherwise negative except as noted above.    Blood pressure 152/80, pulse 72, height 5\' 9"  (1.753 m), weight 203 lb (92.08 kg).  General appearance: alert and no distress Neck: no adenopathy, no carotid bruit, no JVD, supple, symmetrical, trachea midline  and thyroid not enlarged, symmetric, no tenderness/mass/nodules Lungs: clear to auscultation bilaterally Heart: regular rate and rhythm, S1, S2 normal, no murmur, click, rub or gallop Extremities: extremities normal, atraumatic, no cyanosis or edema  EKG not performed today  ASSESSMENT AND PLAN:   Carotid artery disease, bilateral, asymptomatic History of carotid artery disease with moderate left ICA stenosis by 2 puffs ultrasound a year ago. He is neurologically  asymptomatic. We will recheck this  HYPERLIPIDEMIA History of hyperlipidemia not on statin therapy intolerant to statin drugs. We will recheck a lipid and liver profile  Essential hypertension History of hypertension blood pressure measured today 152/80. He is on lisinopril and Lopressor. Continue current meds at current dosing  S/P CABG x 3: LIMA to LAD, SVG to PD, SVG to OM.  History of coronary artery disease status post coronary artery bypass grafting X 3 with a LIMA to his LAD, vein to the circumflex and PDA. A Myoview stress test performed 04/19/12 was normal. He denies chest pain or shortness of breath  AS, s/p tissue AVR 12/20/11 History of aortic stenosis status post tissue aVR at the time of coronary artery bypass grafting 12/20/11 echo performed one year ago that showed normal LV function with a well-functioning functioning aortic bioprosthesis. We will recheck a 2-D echocardiogram      [name] MD [facility], [facility] 09/14/2015 10:52 AM

## 2015-09-14 NOTE — Assessment & Plan Note (Signed)
History of hyperlipidemia not on statin therapy intolerant to statin drugs. We will recheck a lipid and liver profile

## 2015-09-14 NOTE — Assessment & Plan Note (Signed)
History of hypertension blood pressure measured today 152/80. He is on lisinopril and Lopressor. Continue current meds at current dosing

## 2015-09-16 ENCOUNTER — Encounter (HOSPITAL_COMMUNITY): Payer: Medicare Other

## 2015-09-18 LAB — HEPATIC FUNCTION PANEL
ALT: 14 U/L (ref 9–46)
AST: 19 U/L (ref 10–35)
Albumin: 3.4 g/dL — ABNORMAL LOW (ref 3.6–5.1)
Alkaline Phosphatase: 41 U/L (ref 40–115)
BILIRUBIN INDIRECT: 1.1 mg/dL (ref 0.2–1.2)
BILIRUBIN TOTAL: 1.4 mg/dL — AB (ref 0.2–1.2)
Bilirubin, Direct: 0.3 mg/dL — ABNORMAL HIGH (ref <=0.2)
Total Protein: 6.2 g/dL (ref 6.1–8.1)

## 2015-09-18 LAB — LIPID PANEL
Cholesterol: 157 mg/dL (ref 125–200)
HDL: 43 mg/dL (ref >=40)
LDL Cholesterol: 100 mg/dL (ref <130)
TRIGLYCERIDES: 70 mg/dL (ref <150)
Total CHOL/HDL Ratio: 3.7 Ratio (ref <=5.0)
VLDL: 14 mg/dL (ref <30)

## 2015-09-22 ENCOUNTER — Ambulatory Visit (HOSPITAL_COMMUNITY)
Admission: RE | Admit: 2015-09-22 | Discharge: 2015-09-22 | Disposition: A | Discharge status: Home / Self Care | Payer: Medicare Other | Source: Ambulatory Visit | Attending: Cardiology | Admitting: Cardiology

## 2015-09-22 DIAGNOSIS — I10 Essential (primary) hypertension: Secondary | ICD-10-CM | POA: Diagnosis not present

## 2015-09-22 DIAGNOSIS — I779 Disorder of arteries and arterioles, unspecified: Secondary | ICD-10-CM | POA: Diagnosis not present

## 2015-09-22 DIAGNOSIS — I6523 Occlusion and stenosis of bilateral carotid arteries: Secondary | ICD-10-CM | POA: Diagnosis not present

## 2015-09-22 DIAGNOSIS — I739 Peripheral vascular disease, unspecified: Secondary | ICD-10-CM

## 2015-09-28 ENCOUNTER — Ambulatory Visit (HOSPITAL_COMMUNITY): Payer: Medicare Other | Attending: Cardiology

## 2015-09-28 ENCOUNTER — Other Ambulatory Visit: Payer: Self-pay

## 2015-09-28 DIAGNOSIS — I779 Disorder of arteries and arterioles, unspecified: Secondary | ICD-10-CM | POA: Insufficient documentation

## 2015-09-28 DIAGNOSIS — I359 Nonrheumatic aortic valve disorder, unspecified: Secondary | ICD-10-CM | POA: Diagnosis present

## 2015-09-28 DIAGNOSIS — I351 Nonrheumatic aortic (valve) insufficiency: Secondary | ICD-10-CM | POA: Diagnosis not present

## 2015-09-28 DIAGNOSIS — E785 Hyperlipidemia, unspecified: Secondary | ICD-10-CM | POA: Diagnosis not present

## 2015-09-28 DIAGNOSIS — I77810 Thoracic aortic ectasia: Secondary | ICD-10-CM | POA: Insufficient documentation

## 2015-09-28 DIAGNOSIS — I119 Hypertensive heart disease without heart failure: Secondary | ICD-10-CM | POA: Insufficient documentation

## 2015-09-28 DIAGNOSIS — I739 Peripheral vascular disease, unspecified: Secondary | ICD-10-CM

## 2015-09-28 DIAGNOSIS — I340 Nonrheumatic mitral (valve) insufficiency: Secondary | ICD-10-CM | POA: Insufficient documentation

## 2015-09-28 DIAGNOSIS — I10 Essential (primary) hypertension: Secondary | ICD-10-CM

## 2015-09-28 DIAGNOSIS — Z953 Presence of xenogenic heart valve: Secondary | ICD-10-CM | POA: Insufficient documentation

## 2015-09-29 ENCOUNTER — Telehealth: Payer: Self-pay

## 2015-09-29 DIAGNOSIS — Z952 Presence of prosthetic heart valve: Secondary | ICD-10-CM

## 2015-09-29 DIAGNOSIS — I6529 Occlusion and stenosis of unspecified carotid artery: Secondary | ICD-10-CM

## 2015-09-29 NOTE — Telephone Encounter (Signed)
-----   Message from [name], MD sent at 09/29/2015 11:04 AM EST ----- Normal LV systolic function with grade 1 diastolic dysfunction, normally functioning aortic bioprosthetic valve with trace to mild aortic insufficiency. This is a new finding of little clinical importance. Repeat 2-D echo in 12 months

## 2015-09-29 NOTE — Telephone Encounter (Signed)
-----   Message from [name], MD sent at 09/26/2015  4:16 PM EST ----- Mildly elevated Bili. LDL 100. Not at goal but statin intol.

## 2015-11-27 ENCOUNTER — Other Ambulatory Visit: Payer: Self-pay | Admitting: Cardiovascular Disease

## 2015-11-29 NOTE — Telephone Encounter (Signed)
Rx request sent to pharmacy.  

## 2016-03-01 DIAGNOSIS — H7011 Chronic mastoiditis, right ear: Secondary | ICD-10-CM | POA: Insufficient documentation

## 2016-03-01 DIAGNOSIS — R42 Dizziness and giddiness: Secondary | ICD-10-CM | POA: Insufficient documentation

## 2016-09-26 ENCOUNTER — Ambulatory Visit (HOSPITAL_COMMUNITY)
Admission: RE | Admit: 2016-09-26 | Discharge: 2016-09-26 | Disposition: A | Discharge status: Home / Self Care | Payer: Medicare Other | Source: Ambulatory Visit | Attending: Cardiovascular Disease | Admitting: Cardiovascular Disease

## 2016-09-26 ENCOUNTER — Encounter: Payer: Self-pay | Admitting: Cardiovascular Disease

## 2016-09-26 ENCOUNTER — Ambulatory Visit (INDEPENDENT_AMBULATORY_CARE_PROVIDER_SITE_OTHER): Payer: Medicare Other | Admitting: Cardiovascular Disease

## 2016-09-26 ENCOUNTER — Other Ambulatory Visit: Payer: Self-pay | Admitting: Cardiovascular Disease

## 2016-09-26 VITALS — BP 140/82 | HR 67 | Ht 69.0 in | Wt 201.0 lb

## 2016-09-26 DIAGNOSIS — I739 Peripheral vascular disease, unspecified: Secondary | ICD-10-CM

## 2016-09-26 DIAGNOSIS — I779 Disorder of arteries and arterioles, unspecified: Secondary | ICD-10-CM

## 2016-09-26 DIAGNOSIS — I2510 Atherosclerotic heart disease of native coronary artery without angina pectoris: Secondary | ICD-10-CM

## 2016-09-26 DIAGNOSIS — I6523 Occlusion and stenosis of bilateral carotid arteries: Secondary | ICD-10-CM

## 2016-09-26 DIAGNOSIS — I350 Nonrheumatic aortic (valve) stenosis: Secondary | ICD-10-CM

## 2016-09-26 DIAGNOSIS — I10 Essential (primary) hypertension: Secondary | ICD-10-CM | POA: Diagnosis not present

## 2016-09-26 DIAGNOSIS — I6529 Occlusion and stenosis of unspecified carotid artery: Secondary | ICD-10-CM

## 2016-09-26 NOTE — Assessment & Plan Note (Signed)
History of CAD status post directional coronary atherectomy by myself July 1994. He ultimately underwent coronary artery bypass grafting X 3 12/20/11 by Dr. Servando Snare with a LIMA to his LAD, vein to circumflex and PDA. Also underwent tissue AVR for aortic stenosis which we have been following by 2-D echocardiography on an annual basis. He denies chest pain or shortness of breath.

## 2016-09-26 NOTE — Assessment & Plan Note (Signed)
History of hyperlipidemia not on statin therapy and intolerant to statin drugs.

## 2016-09-26 NOTE — Patient Instructions (Signed)

## 2016-09-26 NOTE — Progress Notes (Signed)
09/26/2016 [name]   [birth year]  [id number]  Primary Physician [name], MD Primary Cardiologist: [name] MD Renae Gloss  HPI:  The patient is a very pleasant 83 year old thin appearing married Caucasian male, father of 22, grandfather to 18 and great grandfather to 24 grandchildren who is accompanied by his daughter today. I last saw him 09/14/15. He has a history of coronary artery disease status post directional coronary atherectomy by myself July 1994. He ultimately underwent coronary artery bypass grafting x3 12/20/11 with a LIMA to his LAD, a vein to the circumflex and to the PDA as well as a tissue AVR for aortic stenosis over the summer time. His other problems include hypertension, hyperlipidemia, intolerant to statin drugs. He does have a remote history of a pulmonary emboli and has been on Coumadin anticoagulation for the last 5 years. He has known asymptomatic moderate left internal carotid artery stenosis which we are following by duplex ultrasound. He saw [name] in the office July 05, 2012 because of hypertension and his lisinopril was adjusted. He was counseled about salt intake. A 2D echo performed 10/19/14 showed a well-functioning aortic bioprosthesis with normal LV function, and a Myoview stress test was normal as well.  Since I saw him a year ago he's remained currently stable denying chest pain or shortness of breath.    Current Outpatient Prescriptions  Medication Sig Dispense Refill  . amoxicillin (AMOXIL) 500 MG capsule Take 4 tablets one hour prior to dental appointment 4 capsule 1  . aspirin 81 MG tablet Take 81 mg by mouth daily.    [name] lisinopril (PRINIVIL,ZESTRIL) 20 MG tablet TAKE 1 TABLET BY MOUTH DAILY 90 tablet 3  . metoprolol tartrate (LOPRESSOR) 25 MG tablet TAKE ONE TABLET BY MOUTH TWICE DAILY 180 tablet 3  . NITROSTAT 0.4 MG SL tablet TAKE 1 TABLET UNDER THE TONGUE AS NEEDEDFOR CHEST PAIN (MAY REPEAT EVERY 5 MIN X3 DOSES,  CALL 911 IF NO RELIEF.) 25 tablet 3  . warfarin (COUMADIN) 2.5 MG tablet Take 2.5 mg by mouth daily.     No current facility-administered medications for this visit.     Allergies  Allergen Reactions  . Atorvastatin     myalgias  . Sulfonamide Derivatives Rash    "I don't even remember having problem; dr says I did"    Social History   Social History  . Marital status: Married    Spouse name: N/A  . Number of children: N/A  . Years of education: N/A   Occupational History  . Not on file.   Social History Main Topics  . Smoking status: Never Smoker  . Smokeless tobacco: Never Used  . Alcohol use No  . Drug use: No  . Sexual activity: Yes   Other Topics Concern  . Not on file   Social History Narrative  . No narrative on file     Review of Systems: General: negative for chills, fever, night sweats or weight changes.  Cardiovascular: negative for chest pain, dyspnea on exertion, edema, orthopnea, palpitations, paroxysmal nocturnal dyspnea or shortness of breath Dermatological: negative for rash Respiratory: negative for cough or wheezing Urologic: negative for hematuria Abdominal: negative for nausea, vomiting, diarrhea, bright red blood per rectum, melena, or hematemesis Neurologic: negative for visual changes, syncope, or dizziness All other systems reviewed and are otherwise negative except as noted above.    Blood pressure 140/82, pulse 67, height 5\' 9"  (1.753 m), weight 201 lb (91.2 kg).  General appearance: alert and no distress Neck: no adenopathy, no JVD, supple, symmetrical, trachea midline, thyroid not enlarged, symmetric, no tenderness/mass/nodules and Soft bilateral carotid bruits Lungs: clear to auscultation bilaterally Heart: regular rate and rhythm, S1, S2 normal, no murmur, click, rub or gallop Extremities: extremities normal, atraumatic, no cyanosis or edema  EKG sinus rhythm at 67 without ST or T-wave changes.I Personally reviewed this  EKG.  ASSESSMENT AND PLAN:   Carotid artery disease, bilateral, asymptomatic History of carotid artery disease with Doppler performed 09/26/16 revealing moderate left ICA stenosis unchanged from prior tracings. He does have moderately severe right subclavian artery stenosis with a small blood pressure differential.  HYPERLIPIDEMIA History of hyperlipidemia not on statin therapy and intolerant to statin drugs.  Essential hypertension History of hypertension blood pressure measured 140/82. He is on lisinopril and Lopressor. Continue current meds are Cardizem  CAD, CABG X 3 12/20/11 History of CAD status post directional coronary atherectomy by myself July 1994. He ultimately underwent coronary artery bypass grafting X 3 12/20/11 by Dr. Servando Snare with a LIMA to his LAD, vein to circumflex and PDA. Also underwent tissue AVR for aortic stenosis which we have been following by 2-D echocardiography on an annual basis. He denies chest pain or shortness of breath.      [name] MD FACP,FACC,FAHA, [facility] 09/26/2016 9:28 AM

## 2016-09-26 NOTE — Assessment & Plan Note (Signed)
History of hypertension blood pressure measured 140/82. He is on lisinopril and Lopressor. Continue current meds are Cardizem

## 2016-09-26 NOTE — Assessment & Plan Note (Signed)
History of carotid artery disease with Doppler performed 09/26/16 revealing moderate left ICA stenosis unchanged from prior tracings. He does have moderately severe right subclavian artery stenosis with a small blood pressure differential.

## 2016-09-28 ENCOUNTER — Ambulatory Visit (HOSPITAL_COMMUNITY): Payer: Medicare Other | Attending: Internal Medicine

## 2016-09-28 ENCOUNTER — Other Ambulatory Visit: Payer: Self-pay

## 2016-09-28 DIAGNOSIS — I501 Left ventricular failure: Secondary | ICD-10-CM | POA: Diagnosis not present

## 2016-09-28 DIAGNOSIS — I340 Nonrheumatic mitral (valve) insufficiency: Secondary | ICD-10-CM | POA: Insufficient documentation

## 2016-09-28 DIAGNOSIS — Z952 Presence of prosthetic heart valve: Secondary | ICD-10-CM | POA: Diagnosis not present

## 2016-09-28 DIAGNOSIS — I359 Nonrheumatic aortic valve disorder, unspecified: Secondary | ICD-10-CM | POA: Diagnosis not present

## 2016-09-28 DIAGNOSIS — I351 Nonrheumatic aortic (valve) insufficiency: Secondary | ICD-10-CM | POA: Diagnosis not present

## 2016-10-02 ENCOUNTER — Other Ambulatory Visit: Payer: Self-pay | Admitting: Cardiovascular Disease

## 2016-10-02 DIAGNOSIS — I359 Nonrheumatic aortic valve disorder, unspecified: Secondary | ICD-10-CM

## 2016-10-02 DIAGNOSIS — Z952 Presence of prosthetic heart valve: Secondary | ICD-10-CM

## 2016-11-13 ENCOUNTER — Other Ambulatory Visit: Payer: Self-pay | Admitting: Cardiovascular Disease

## 2016-11-14 ENCOUNTER — Other Ambulatory Visit: Payer: Self-pay | Admitting: Cardiovascular Disease

## 2016-11-14 MED ORDER — LISINOPRIL 20 MG PO TABS: 20.0000 mg | ORAL_TABLET | Freq: Every day | ORAL | 3 refills | Status: DC

## 2016-11-14 MED ORDER — METOPROLOL TARTRATE 25 MG PO TABS: 25.0000 mg | ORAL_TABLET | Freq: Two times a day (BID) | ORAL | 3 refills | Status: DC

## 2016-11-14 NOTE — Telephone Encounter (Signed)
Rx request sent to pharmacy.  

## 2016-11-14 NOTE — Telephone Encounter (Signed)
New Message    *STAT* If patient is at the pharmacy, call can be transferred to refill team.   1. Which medications need to be refilled? (please list name of each medication and dose if known) metoprolol tartrate (LOPRESSOR) 25 MG tablet lisinopril (PRINIVIL,ZESTRIL) 20 MG tablet  2. Which pharmacy/location (including street and city if local pharmacy) is medication to be sent to? [facility] [place]  3. Do they need a 30 day or 90 day supply? 90 day for both

## 2017-05-10 ENCOUNTER — Other Ambulatory Visit (HOSPITAL_COMMUNITY): Payer: Self-pay | Admitting: Family Medicine

## 2017-05-10 ENCOUNTER — Ambulatory Visit (HOSPITAL_COMMUNITY)
Admission: RE | Admit: 2017-05-10 | Discharge: 2017-05-10 | Disposition: A | Discharge status: Home / Self Care | Payer: Medicare Other | Source: Ambulatory Visit | Attending: Vascular Surgery | Admitting: Vascular Surgery

## 2017-05-10 DIAGNOSIS — I824Z2 Acute embolism and thrombosis of unspecified deep veins of left distal lower extremity: Secondary | ICD-10-CM | POA: Diagnosis not present

## 2017-05-10 DIAGNOSIS — Z09 Encounter for follow-up examination after completed treatment for conditions other than malignant neoplasm: Secondary | ICD-10-CM | POA: Insufficient documentation

## 2017-05-10 DIAGNOSIS — Z86718 Personal history of other venous thrombosis and embolism: Secondary | ICD-10-CM | POA: Diagnosis not present

## 2017-05-10 DIAGNOSIS — I82492 Acute embolism and thrombosis of other specified deep vein of left lower extremity: Secondary | ICD-10-CM | POA: Diagnosis present

## 2017-06-12 ENCOUNTER — Encounter: Payer: Self-pay | Admitting: Cardiovascular Disease

## 2017-06-12 ENCOUNTER — Ambulatory Visit: Payer: Medicare Other | Admitting: Cardiovascular Disease

## 2017-06-12 VITALS — BP 146/74 | HR 83 | Ht 69.0 in | Wt 195.0 lb

## 2017-06-12 DIAGNOSIS — I10 Essential (primary) hypertension: Secondary | ICD-10-CM | POA: Diagnosis not present

## 2017-06-12 DIAGNOSIS — I6523 Occlusion and stenosis of bilateral carotid arteries: Secondary | ICD-10-CM

## 2017-06-12 DIAGNOSIS — I2510 Atherosclerotic heart disease of native coronary artery without angina pectoris: Secondary | ICD-10-CM | POA: Diagnosis not present

## 2017-06-12 NOTE — Assessment & Plan Note (Signed)
History of carotid artery disease status post Doppler study 09/26/16 revealing moderate left ICA stenosis. We will repeat this on an annual basis. He does have high-grade right subclavian artery stenosis as well with fairly equal blood pressures.

## 2017-06-12 NOTE — Assessment & Plan Note (Signed)
History of coronary artery disease status post directional atherectomy by myself July 1994 ultimately requiring bypass grafting 35/29/13 with a LIMA to his LAD, vein to the circumflex and PDA as well as a tissue AVR at that time. His most recent 2-D echocardiogram performed 09/28/16 revealed normal LV systolic function with a well functioning aortic bioprosthesis and a peak gradient 20 mmHg.

## 2017-06-12 NOTE — Patient Instructions (Signed)
Medication Instructions: Your physician recommends that you continue on your current medications as directed. Please refer to the Current Medication list given to you today.   Labwork: I will request lab work from Dr. Irven Shelling office.  Testing/Procedures: Your physician has requested that you have a carotid duplex in March 2019. This test is an ultrasound of the carotid arteries in your neck. It looks at blood flow through these arteries that supply the brain with blood. Allow one hour for this exam. There are no restrictions or special instructions.  Your physician has requested that you have an echocardiogram in March 2019. Echocardiography is a painless test that uses sound waves to create images of your heart. It provides your doctor with information about the size and shape of your heart and how well your heart's chambers and valves are working. This procedure takes approximately one hour. There are no restrictions for this procedure.  Follow-Up: We request that you follow-up in: 6 weeks with an extender and in 12 months with Dr Andria Rhein will receive a reminder letter in the mail two months in advance. If you don't receive a letter, please call our office to schedule the follow-up appointment.  If you need a refill on your cardiac medications before your next appointment, please call your pharmacy.

## 2017-06-12 NOTE — Progress Notes (Signed)
06/12/2017 Curtis Brennan   Jun 13, 1932  [id number]  Primary Physician [name], MD Primary Cardiologist: [name] MD [name], [place]  HPI:  Curtis Brennan is a 83 y.o.  thin appearing married Caucasian male, father of 64, grandfather to 4 and great grandfather to 39 grandchildren who is accompanied by his daughter today. I last saw him 09/14/15. He has a history of coronary artery disease status post directional coronary atherectomy by myself July 1994. He ultimately underwent coronary artery bypass grafting x3 12/20/11 with a LIMA to his LAD, a vein to the circumflex and to the PDA as well as a tissue AVR for aortic stenosis over the summer time. His other problems include hypertension, hyperlipidemia, intolerant to statin drugs. He does have a remote history of a pulmonary emboli and has been on Coumadin anticoagulation for the last 5 years. He has known asymptomatic moderate left internal carotid artery stenosis which we are following by duplex ultrasound. He saw [name] in the office July 05, 2012 because of hypertension and his lisinopril was adjusted. He was counseled about salt intake. A 2D echo performed 10/19/14 showed a well-functioning aortic bioprosthesis with normal LV function, and a Myoview stress test was normal as well. Since I saw him a year ago he's remained currently stable denying chest pain or shortness of breath. He has developed asymmetric left lower extremity edema over the last month with recent duplex ruling out DVT. He was put on oral diuretic recently. He denies shortness of breath. The echo performed 09/28/16 revealed normal LV systolic function with a well-functioning aortic bioprosthesis.     Current Meds  Medication Sig  . amoxicillin (AMOXIL) 500 MG capsule Take 4 tablets one hour prior to dental appointment  . aspirin 81 MG tablet Take 81 mg by mouth daily.  . furosemide (LASIX) 40 MG tablet Take 0.5 tablets by mouth every other day.    . lisinopril (PRINIVIL,ZESTRIL) 20 MG tablet Take 1 tablet (20 mg total) by mouth daily.  . metoprolol tartrate (LOPRESSOR) 25 MG tablet Take 1 tablet (25 mg total) by mouth 2 (two) times daily.  [name] NITROSTAT 0.4 MG SL tablet TAKE 1 TABLET UNDER THE TONGUE AS NEEDEDFOR CHEST PAIN (MAY REPEAT EVERY 5 MIN X3 DOSES, CALL 911 IF NO RELIEF.)  . warfarin (COUMADIN) 5 MG tablet Take 1 tablet by mouth. As directed.     Allergies  Allergen Reactions  . Atorvastatin     myalgias  . Sulfonamide Derivatives Rash    "I don't even remember having problem; dr says I did"    Social History   Socioeconomic History  . Marital status: Married    Spouse name: Not on file  . Number of children: Not on file  . Years of education: Not on file  . Highest education level: Not on file  Social Needs  . Financial resource strain: Not on file  . Food insecurity - worry: Not on file  . Food insecurity - inability: Not on file  . Transportation needs - medical: Not on file  . Transportation needs - non-medical: Not on file  Occupational History  . Not on file  Tobacco Use  . Smoking status: Never Smoker  . Smokeless tobacco: Never Used  Substance and Sexual Activity  . Alcohol use: No  . Drug use: No  . Sexual activity: Yes  Other Topics Concern  . Not on file  Social History Narrative  . Not on file  Review of Systems: General: negative for chills, fever, night sweats or weight changes.  Cardiovascular: negative for chest pain, dyspnea on exertion, edema, orthopnea, palpitations, paroxysmal nocturnal dyspnea or shortness of breath Dermatological: negative for rash Respiratory: negative for cough or wheezing Urologic: negative for hematuria Abdominal: negative for nausea, vomiting, diarrhea, bright red blood per rectum, melena, or hematemesis Neurologic: negative for visual changes, syncope, or dizziness All other systems reviewed and are otherwise negative except as noted above.    Blood  pressure (!) 146/74, pulse 83, height 5\' 9"  (1.753 m), weight 195 lb (88.5 kg).  General appearance: alert and no distress Neck: no adenopathy, no carotid bruit, no JVD, supple, symmetrical, trachea midline and thyroid not enlarged, symmetric, no tenderness/mass/nodules Lungs: clear to auscultation bilaterally Heart: regular rate and rhythm, S1, S2 normal, no murmur, click, rub or gallop Extremities: 1+ pitting left lower extremity edema. Pulses: 2+ and symmetric Skin: Skin color, texture, turgor normal. No rashes or lesions Neurologic: Alert and oriented X 3, normal strength and tone. Normal symmetric reflexes. Normal coordination and gait  EKG sinus rhythm at 83 without ST or T-wave changes.  I reviewed this EKG.  ASSESSMENT AND PLAN:   Carotid artery disease, bilateral, asymptomatic History of carotid artery disease status post Doppler study 09/26/16 revealing moderate left ICA stenosis. We will repeat this on an annual basis. He does have high-grade right subclavian artery stenosis as well with fairly equal blood pressures.  HYPERLIPIDEMIA History of hyperlipidemia not on statin therapy followed by his PCP. He is intolerant to statin therapy.  Essential hypertension History of essential hypertension of blood pressure measures 146/74. He is on lisinopril and Lopressor. Continue current meds at current dosing.  CAD, CABG X 3 12/20/11 History of coronary artery disease status post directional atherectomy by myself July 1994 ultimately requiring bypass grafting 35/29/13 with a LIMA to his LAD, vein to the circumflex and PDA as well as a tissue AVR at that time. His most recent 2-D echocardiogram performed 09/28/16 revealed normal LV systolic function with a well functioning aortic bioprosthesis and a peak gradient 20 mmHg.      [name] MD FACP,FACC,FAHA, [facility] 06/12/2017 2:13 PM

## 2017-06-12 NOTE — Assessment & Plan Note (Signed)
History of hyperlipidemia not on statin therapy followed by his PCP. He is intolerant to statin therapy.

## 2017-06-12 NOTE — Assessment & Plan Note (Signed)
History of essential hypertension of blood pressure measures 146/74. He is on lisinopril and Lopressor. Continue current meds at current dosing.

## 2017-07-26 ENCOUNTER — Ambulatory Visit: Payer: Medicare Other | Admitting: Cardiology

## 2017-07-26 ENCOUNTER — Encounter: Payer: Self-pay | Admitting: Cardiology

## 2017-07-26 VITALS — BP 161/85 | HR 73 | Ht 69.0 in | Wt 198.8 lb

## 2017-07-26 DIAGNOSIS — I10 Essential (primary) hypertension: Secondary | ICD-10-CM

## 2017-07-26 DIAGNOSIS — Z7901 Long term (current) use of anticoagulants: Secondary | ICD-10-CM | POA: Diagnosis not present

## 2017-07-26 DIAGNOSIS — E785 Hyperlipidemia, unspecified: Secondary | ICD-10-CM

## 2017-07-26 DIAGNOSIS — I480 Paroxysmal atrial fibrillation: Secondary | ICD-10-CM

## 2017-07-26 DIAGNOSIS — I6523 Occlusion and stenosis of bilateral carotid arteries: Secondary | ICD-10-CM

## 2017-07-26 DIAGNOSIS — I350 Nonrheumatic aortic (valve) stenosis: Secondary | ICD-10-CM | POA: Diagnosis not present

## 2017-07-26 DIAGNOSIS — Z951 Presence of aortocoronary bypass graft: Secondary | ICD-10-CM

## 2017-07-26 MED ORDER — METOPROLOL TARTRATE 25 MG PO TABS: 37.5000 mg | ORAL_TABLET | Freq: Two times a day (BID) | ORAL | 3 refills | Status: DC

## 2017-07-26 NOTE — Assessment & Plan Note (Signed)
Statin intolerant 

## 2017-07-26 NOTE — Assessment & Plan Note (Signed)
Chronic Coumadin anticoagulation for history of pulmonary emboli

## 2017-07-26 NOTE — Assessment & Plan Note (Signed)
Due for follow up dopplers

## 2017-07-26 NOTE — Assessment & Plan Note (Signed)
No recurrance

## 2017-07-26 NOTE — Assessment & Plan Note (Signed)
Last echo March 2018

## 2017-07-26 NOTE — Progress Notes (Signed)
07/26/2017 Curtis Brennan   10/20/1931  [id number]  Primary Physician [name], MD Primary Cardiologist: Dr Gwenlyn Found  HPI:  84 y/o followed for many years by Dr Gwenlyn Found with a history of remote PCI in 1994, followed by CABGx 3 with tissue AVR in 2013. A Myoview done March 2016 was low risk and an echo done March 2018 showed good LVF and valve function. He is on chronic Coumadin for a history of PE. He had some problems with unilateral Lt leg edema in the Fall of 2018.  Its a little confusing but he had a venous doppler by Dr Oneida Alar 05/10/17 which was negative for DVT. He was then referred by his PCP to Dr Virgina Jock at [facility] for (?) for follow up. Dr Virgina Jock discussed Mr Arnolld's case with Dr Gwenlyn Found. Ultimately the pt's Lopressor was increased to 37.5 mg BID and he was prescribed Lasix 20 mg but he never took this on outside advise. He saw dr Gwenlyn Found in Nov and was doing well. He is due for carotid dopplers and an echo in March. Today the pt tells me he si doing well. His Lt LE edema has improved, not clear why. He denies any tachycardia or palpitations. He denies orthopnea or PND. He thinks he feels better since his Lopressor was increased.   Current Outpatient Medications  Medication Sig Dispense Refill  . amoxicillin (AMOXIL) 500 MG capsule Take 4 tablets one hour prior to dental appointment 4 capsule 1  . aspirin 81 MG tablet Take 81 mg by mouth daily.    [name] lisinopril (PRINIVIL,ZESTRIL) 20 MG tablet Take 1 tablet (20 mg total) by mouth daily. 90 tablet 3  . NITROSTAT 0.4 MG SL tablet TAKE 1 TABLET UNDER THE TONGUE AS NEEDEDFOR CHEST PAIN (MAY REPEAT EVERY 5 MIN X3 DOSES, CALL 911 IF NO RELIEF.) 25 tablet 3  . warfarin (COUMADIN) 5 MG tablet Take 1 tablet by mouth. As directed.    . metoprolol tartrate (LOPRESSOR) 25 MG tablet Take 1.5 tablets (37.5 mg total) by mouth 2 (two) times daily. 270 tablet 3   No current facility-administered medications for this visit.      Allergies  Allergen Reactions  . Atorvastatin     myalgias  . Sulfonamide Derivatives Rash    "I don't even remember having problem; dr says I did"    Past Medical History:  Diagnosis Date  . Angina   . AS (aortic stenosis) moderate to severe by cath 12/14/11 for AVR  12/15/2011  . CAD (coronary artery disease) with history of LAD & Diag athrectomy in 1994, now with severe 3 vesseal CAD by cath 12/14/11 for CABG 12/15/2011  . Carotid artery disease, bilateral 12/15/2011  . Chronic anticoagulation, on coumadin for pul emboli 12/15/2011  . Complication of anesthesia 07/1997   "just wouldn't wake wakeup"  . Coronary artery disease   . Exertional dyspnea   . Heart murmur   . Hypertension   . Kidney stones   . Legionnaire's disease ([place]) 1986  . Pneumonia    "once"  . PONV (postoperative nausea and vomiting)   . Prostate cancer ([place])   . Pulmonary embolism ([place]) ~ 2011   "both lungs"  . Unstable angina ([place]) 12/15/2011    Social History   Socioeconomic History  . Marital status: Married    Spouse name: Not on file  . Number of children: Not on file  . Years of education: Not on file  . Highest education level: Not on  file  Social Needs  . Financial resource strain: Not on file  . Food insecurity - worry: Not on file  . Food insecurity - inability: Not on file  . Transportation needs - medical: Not on file  . Transportation needs - non-medical: Not on file  Occupational History  . Not on file  Tobacco Use  . Smoking status: Never Smoker  . Smokeless tobacco: Never Used  Substance and Sexual Activity  . Alcohol use: No  . Drug use: No  . Sexual activity: Yes  Other Topics Concern  . Not on file  Social History Narrative  . Not on file     History reviewed. No pertinent family history.   Review of Systems: General: negative for chills, fever, night sweats or weight changes.  Cardiovascular: negative for chest pain, dyspnea on exertion, edema, orthopnea,  palpitations, paroxysmal nocturnal dyspnea or shortness of breath Dermatological: negative for rash Respiratory: negative for cough or wheezing Urologic: negative for hematuria Abdominal: negative for nausea, vomiting, diarrhea, bright red blood per rectum, melena, or hematemesis Neurologic: negative for visual changes, syncope, or dizziness All other systems reviewed and are otherwise negative except as noted above.    Blood pressure (!) 161/85, pulse 73, height 5\' 9"  (1.753 m), weight 198 lb 12.8 oz (90.2 kg).  General appearance: alert, cooperative and no distress Neck: no JVD and transmitted murmur Lungs: few crackles Lt base Heart: regular rate and rhythm and soft systolic murmur AOV Extremities: 1+ bilateral pitting LE edema Skin: Skin color, texture, turgor normal. No rashes or lesions Neurologic: Grossly normal   ASSESSMENT AND PLAN:   Hx of CABG  CABG X 3 12/20/11, LIMA to LAD, SVG to PD, SVG to OM.  Myoview low risk March 2018  AS, s/p tissue AVR 12/20/11 Last echo March 2018  Carotid artery disease, bilateral, asymptomatic Due for follow up dopplers  PAF s/p CABG/AVR No recurrance  Chronic anticoagulation, on coumadin for pul emboli Chronic Coumadin anticoagulation for history of pulmonary emboli  Essential hypertension Repeat B/P 142/72  Dyslipidemia Statin intolerant    PLAN  Cardiac stable. He does have some residual LE edema and I recommended he take the Lasix 20 mg but he declines at this time. He feels this amount of edema is "normal" for him. He is scheduled for an echo and carotid dopplers and will follow up with Dr Gwenlyn Found after this.   Kerin Ransom PA-C 07/26/2017 2:08 PM

## 2017-07-26 NOTE — Assessment & Plan Note (Signed)
CABG X 3 12/20/11, LIMA to LAD, SVG to PD, SVG to OM.  Myoview low risk March 2018

## 2017-07-26 NOTE — Patient Instructions (Signed)
Medication Instructions:  Your physician recommends that you continue on your current medications as directed. Please refer to the Current Medication list given to you today.  Follow-Up: Your physician recommends that you schedule a follow-up appointment in: after testing in March/April with Dr. Gwenlyn Found.   Any Other Special Instructions Will Be Listed Below (If Applicable).     If you need a refill on your cardiac medications before your next appointment, please call your pharmacy.

## 2017-07-26 NOTE — Assessment & Plan Note (Signed)
Repeat B/P 142/72

## 2017-10-10 ENCOUNTER — Ambulatory Visit (HOSPITAL_COMMUNITY): Payer: Medicare Other | Attending: Cardiology

## 2017-10-10 ENCOUNTER — Other Ambulatory Visit: Payer: Self-pay

## 2017-10-10 DIAGNOSIS — I10 Essential (primary) hypertension: Secondary | ICD-10-CM | POA: Diagnosis not present

## 2017-10-10 DIAGNOSIS — I359 Nonrheumatic aortic valve disorder, unspecified: Secondary | ICD-10-CM | POA: Insufficient documentation

## 2017-10-10 DIAGNOSIS — Z952 Presence of prosthetic heart valve: Secondary | ICD-10-CM | POA: Insufficient documentation

## 2017-10-10 DIAGNOSIS — I2510 Atherosclerotic heart disease of native coronary artery without angina pectoris: Secondary | ICD-10-CM | POA: Insufficient documentation

## 2017-10-10 DIAGNOSIS — R011 Cardiac murmur, unspecified: Secondary | ICD-10-CM | POA: Insufficient documentation

## 2017-10-10 DIAGNOSIS — Z86711 Personal history of pulmonary embolism: Secondary | ICD-10-CM | POA: Diagnosis not present

## 2017-10-15 ENCOUNTER — Other Ambulatory Visit: Payer: Self-pay | Admitting: Cardiovascular Disease

## 2017-10-15 DIAGNOSIS — I6523 Occlusion and stenosis of bilateral carotid arteries: Secondary | ICD-10-CM

## 2017-10-16 ENCOUNTER — Ambulatory Visit (HOSPITAL_COMMUNITY)
Admission: RE | Admit: 2017-10-16 | Discharge: 2017-10-16 | Disposition: A | Discharge status: Home / Self Care | Payer: Medicare Other | Source: Ambulatory Visit | Attending: Cardiovascular Disease | Admitting: Cardiovascular Disease

## 2017-10-16 DIAGNOSIS — I6523 Occlusion and stenosis of bilateral carotid arteries: Secondary | ICD-10-CM | POA: Diagnosis not present

## 2017-10-16 DIAGNOSIS — E785 Hyperlipidemia, unspecified: Secondary | ICD-10-CM | POA: Insufficient documentation

## 2017-10-16 DIAGNOSIS — I10 Essential (primary) hypertension: Secondary | ICD-10-CM | POA: Insufficient documentation

## 2017-10-17 ENCOUNTER — Other Ambulatory Visit: Payer: Self-pay | Admitting: Cardiovascular Disease

## 2017-10-17 DIAGNOSIS — I350 Nonrheumatic aortic (valve) stenosis: Secondary | ICD-10-CM

## 2017-10-24 ENCOUNTER — Other Ambulatory Visit: Payer: Self-pay

## 2017-10-24 DIAGNOSIS — I6523 Occlusion and stenosis of bilateral carotid arteries: Secondary | ICD-10-CM

## 2017-10-29 ENCOUNTER — Other Ambulatory Visit: Payer: Self-pay | Admitting: Cardiovascular Disease

## 2017-10-29 NOTE — Telephone Encounter (Signed)
Rx request sent to pharmacy.  

## 2017-11-19 LAB — LIPID PANEL
Cholesterol: 164 (ref 0–200)
HDL: 40 (ref 35–70)
LDL CALC: 102
Triglycerides: 122 (ref 40–160)

## 2017-11-19 LAB — HEPATIC FUNCTION PANEL
ALK PHOS: 47 (ref 25–125)
ALT: 16 (ref 10–40)
AST: 22 (ref 14–40)
Bilirubin, Total: 1.5

## 2017-11-19 LAB — BASIC METABOLIC PANEL
BUN: 13 (ref 4–21)
Creatinine: 1.1 (ref <=1.3)
GLUCOSE: 125
Potassium: 4.4 (ref 3.4–5.3)
SODIUM: 136 — AB (ref 137–147)

## 2017-11-19 LAB — CBC AND DIFFERENTIAL
HCT: 41 (ref 41–53)
Hemoglobin: 14.6 (ref 13.5–17.5)
NEUTROS ABS: 3775
PLATELETS: 134 — AB (ref 150–399)
WBC: 5.4

## 2017-11-19 LAB — TSH: TSH: 1.7 (ref <=5.90)

## 2017-11-19 LAB — VITAMIN B12: Vitamin B-12: 469

## 2017-11-19 LAB — HEMOGLOBIN A1C: Hgb A1c MFr Bld: 6.9 — AB (ref 4.0–6.0)

## 2017-11-19 LAB — VITAMIN D 25 HYDROXY (VIT D DEFICIENCY, FRACTURES): VIT D 25 HYDROXY: 29

## 2017-11-30 ENCOUNTER — Ambulatory Visit: Payer: Medicare Other | Admitting: Cardiovascular Disease

## 2017-11-30 ENCOUNTER — Encounter: Payer: Self-pay | Admitting: Cardiovascular Disease

## 2017-11-30 DIAGNOSIS — E785 Hyperlipidemia, unspecified: Secondary | ICD-10-CM | POA: Diagnosis not present

## 2017-11-30 DIAGNOSIS — I6523 Occlusion and stenosis of bilateral carotid arteries: Secondary | ICD-10-CM

## 2017-11-30 DIAGNOSIS — I10 Essential (primary) hypertension: Secondary | ICD-10-CM

## 2017-11-30 DIAGNOSIS — I350 Nonrheumatic aortic (valve) stenosis: Secondary | ICD-10-CM

## 2017-11-30 DIAGNOSIS — Z951 Presence of aortocoronary bypass graft: Secondary | ICD-10-CM

## 2017-11-30 DIAGNOSIS — R600 Localized edema: Secondary | ICD-10-CM

## 2017-11-30 NOTE — Assessment & Plan Note (Signed)
Bilateral lower extremity edema 1 or 2+ mostly in the ankle position.  He admits to dietary indiscretion with regards to salt.  This is not really bothering him and at this point I am not going to place him on a diuretic.

## 2017-11-30 NOTE — Assessment & Plan Note (Signed)
History of dyslipidemia intolerant to statin therapy.  We will obtain his most recent lab work from his PCP

## 2017-11-30 NOTE — Assessment & Plan Note (Signed)
His history of tissue AVR at the time of his bypass surgery which we followed by 2D echocardiogram recently performed 10/10/2017 which revealed normal LV systolic function with a well-functioning aortic bioprosthesis.  Repeat 12 months

## 2017-11-30 NOTE — Progress Notes (Signed)
11/30/2017 Curtis Brennan   Sep 06, 1931  [id number]  Primary Physician [name], MD Primary Cardiologist: [name] MD Curtis Brennan, [place]  HPI:  Curtis Brennan is a 84 y.o.  thin appearing married Caucasian male, father of 1, grandfather to 17 and great grandfather to 20 grandchildren who is accompanied by his daughter today. I last saw 06/12/2017. He has a history of coronary arterydisease status post directional coronary atherectomy by myself July 1994. He ultimately underwent coronary arterybypass grafting x35/29/13with a LIMA to his LAD, a vein to the circumflex and to the PDA as well as a tissue AVR for aortic stenosis over the summer time. His other problems include hypertension, hyperlipidemia, intolerant to statin drugs. He does have a remote history of a pulmonary emboli and has been on Coumadin anticoagulation for the last 5 years. He has known asymptomatic moderate left internal carotid artery stenosis which we are following by duplex ultrasound. He saw [name] in the office July 05, 2012 because of hypertension and his lisinopril was adjusted. He was counseled about salt intake. A 2D echo performed 10/19/14 showed a well-functioning aortic bioprosthesis with normal LV function, and a Myoview stress test was normal as well. Since I saw him a year ago he's remained currently stable denying chest pain or shortness of breath. He denies shortness of breath. The echo performed 10/10/2017 revealed normal LV systolic function with a well-functioning aortic bioprosthesis.  He does have some mild bilateral ankle edema.     Current Meds  Medication Sig  . amoxicillin (AMOXIL) 500 MG capsule Take 4 tablets one hour prior to dental appointment  . aspirin 81 MG tablet Take 81 mg by mouth daily.  [name] lisinopril (PRINIVIL,ZESTRIL) 20 MG tablet TAKE 1 TABLET BY MOUTH DAILY  . metoprolol tartrate (LOPRESSOR) 25 MG tablet Take 1.5 tablets (37.5 mg total) by mouth 2 (two) times  daily.  [name] NITROSTAT 0.4 MG SL tablet TAKE 1 TABLET UNDER THE TONGUE AS NEEDEDFOR CHEST PAIN (MAY REPEAT EVERY 5 MIN X3 DOSES, CALL 911 IF NO RELIEF.)  . warfarin (COUMADIN) 5 MG tablet Take 1 tablet by mouth. As directed.     Allergies  Allergen Reactions  . Atorvastatin     myalgias  . Sulfonamide Derivatives Rash    "I don't even remember having problem; dr says I did"    Social History   Socioeconomic History  . Marital status: Married    Spouse name: Not on file  . Number of children: Not on file  . Years of education: Not on file  . Highest education level: Not on file  Occupational History  . Not on file  Social Needs  . Financial resource strain: Not on file  . Food insecurity:    Worry: Not on file    Inability: Not on file  . Transportation needs:    Medical: Not on file    Non-medical: Not on file  Tobacco Use  . Smoking status: Never Smoker  . Smokeless tobacco: Never Used  Substance and Sexual Activity  . Alcohol use: No  . Drug use: No  . Sexual activity: Yes  Lifestyle  . Physical activity:    Days per week: Not on file    Minutes per session: Not on file  . Stress: Not on file  Relationships  . Social connections:    Talks on phone: Not on file    Gets together: Not on file    Attends religious service: Not  on file    Active member of club or organization: Not on file    Attends meetings of clubs or organizations: Not on file    Relationship status: Not on file  . Intimate partner violence:    Fear of current or ex partner: Not on file    Emotionally abused: Not on file    Physically abused: Not on file    Forced sexual activity: Not on file  Other Topics Concern  . Not on file  Social History Narrative  . Not on file     Review of Systems: General: negative for chills, fever, night sweats or weight changes.  Cardiovascular: negative for chest pain, dyspnea on exertion, edema, orthopnea, palpitations, paroxysmal nocturnal dyspnea or  shortness of breath Dermatological: negative for rash Respiratory: negative for cough or wheezing Urologic: negative for hematuria Abdominal: negative for nausea, vomiting, diarrhea, bright red blood per rectum, melena, or hematemesis Neurologic: negative for visual changes, syncope, or dizziness All other systems reviewed and are otherwise negative except as noted above.    Blood pressure (!) 161/82, pulse 77, height 5\' 9"  (1.753 m), weight 202 lb (91.6 kg).  General appearance: alert and no distress Neck: no adenopathy, no JVD, supple, symmetrical, trachea midline, thyroid not enlarged, symmetric, no tenderness/mass/nodules and Bilateral carotid bruits Lungs: clear to auscultation bilaterally Heart: Soft outflow tract murmur consistent with aortic stenosis Extremities: 1-2+ bilateral lower extremity edema Pulses: 2+ and symmetric Skin: Skin color, texture, turgor normal. No rashes or lesions Neurologic: Alert and oriented X 3, normal strength and tone. Normal symmetric reflexes. Normal coordination and gait  EKG not performed today  ASSESSMENT AND PLAN:   Carotid artery disease, bilateral, asymptomatic History of carotid artery disease with Doppler studies performed 10/17/2017 revealing moderately severe left ICA stenosis which we are following on an annual basis by duplex ultrasound.  Dyslipidemia History of dyslipidemia intolerant to statin therapy.  We will obtain his most recent lab work from his PCP  Essential hypertension History of essential hypertension her blood pressure measured at 161/82.  He is on lisinopril and metoprolol.  Does admit to dietary indiscretion with regards to salt.  History of prior pulm embolism History of remote pulmonary embolism on Coumadin anticoagulation followed by his PCP  Hx of CABG History of coronary artery bypass grafting times 35/29/13 with a LIMA to his LAD, vein graft to a PDA and OM.  He had a low risk Myoview March 2018.  He denies  chest pain or shortness of breath.  AS, s/p tissue AVR 12/20/11 His history of tissue AVR at the time of his bypass surgery which we followed by 2D echocardiogram recently performed 10/10/2017 which revealed normal LV systolic function with a well-functioning aortic bioprosthesis.  Repeat 12 months  Bilateral lower extremity edema Bilateral lower extremity edema 1 or 2+ mostly in the ankle position.  He admits to dietary indiscretion with regards to salt.  This is not really bothering him and at this point I am not going to place him on a diuretic.      [name] MD FACP,FACC,FAHA, [facility] 11/30/2017 11:16 AM

## 2017-11-30 NOTE — Assessment & Plan Note (Signed)
History of remote pulmonary embolism on Coumadin anticoagulation followed by his PCP

## 2017-11-30 NOTE — Patient Instructions (Signed)
Medication Instructions: Your physician recommends that you continue on your current medications as directed. Please refer to the Current Medication list given to you today.  Testing/Procedures: Your physician has requested that you have an echocardiogram (April 2020). Echocardiography is a painless test that uses sound waves to create images of your heart. It provides your doctor with information about the size and shape of your heart and how well your heart's chambers and valves are working. This procedure takes approximately one hour. There are no restrictions for this procedure.  Your physician has requested that you have a carotid duplex (March 2020). This test is an ultrasound of the carotid arteries in your neck. It looks at blood flow through these arteries that supply the brain with blood. Allow one hour for this exam. There are no restrictions or special instructions.  Follow-Up: Your physician wants you to follow-up in: 1 year with Dr. Gwenlyn Found. You will receive a reminder letter in the mail two months in advance. If you don't receive a letter, please call our office to schedule the follow-up appointment.  If you need a refill on your cardiac medications before your next appointment, please call your pharmacy.

## 2017-11-30 NOTE — Assessment & Plan Note (Signed)
History of essential hypertension her blood pressure measured at 161/82.  He is on lisinopril and metoprolol.  Does admit to dietary indiscretion with regards to salt.

## 2017-11-30 NOTE — Assessment & Plan Note (Signed)
History of carotid artery disease with Doppler studies performed 10/17/2017 revealing moderately severe left ICA stenosis which we are following on an annual basis by duplex ultrasound.

## 2017-11-30 NOTE — Assessment & Plan Note (Signed)
History of coronary artery bypass grafting times 35/29/13 with a LIMA to his LAD, vein graft to a PDA and OM.  He had a low risk Myoview March 2018.  He denies chest pain or shortness of breath.

## 2018-04-28 ENCOUNTER — Observation Stay (HOSPITAL_COMMUNITY)
Admission: EM | Admit: 2018-04-28 | Discharge: 2018-04-29 | Disposition: A | Discharge status: Home / Self Care | Payer: Medicare Other | Attending: Oncology | Admitting: Oncology

## 2018-04-28 ENCOUNTER — Encounter (HOSPITAL_COMMUNITY): Payer: Self-pay | Admitting: Emergency Medicine

## 2018-04-28 ENCOUNTER — Inpatient Hospital Stay (HOSPITAL_COMMUNITY): Payer: Medicare Other

## 2018-04-28 ENCOUNTER — Emergency Department (HOSPITAL_COMMUNITY): Payer: Medicare Other

## 2018-04-28 DIAGNOSIS — Z951 Presence of aortocoronary bypass graft: Secondary | ICD-10-CM | POA: Insufficient documentation

## 2018-04-28 DIAGNOSIS — Z7901 Long term (current) use of anticoagulants: Secondary | ICD-10-CM | POA: Diagnosis not present

## 2018-04-28 DIAGNOSIS — G459 Transient cerebral ischemic attack, unspecified: Principal | ICD-10-CM | POA: Diagnosis present

## 2018-04-28 DIAGNOSIS — R413 Other amnesia: Secondary | ICD-10-CM

## 2018-04-28 DIAGNOSIS — I2510 Atherosclerotic heart disease of native coronary artery without angina pectoris: Secondary | ICD-10-CM | POA: Diagnosis not present

## 2018-04-28 DIAGNOSIS — N179 Acute kidney failure, unspecified: Secondary | ICD-10-CM

## 2018-04-28 DIAGNOSIS — Z7982 Long term (current) use of aspirin: Secondary | ICD-10-CM | POA: Insufficient documentation

## 2018-04-28 DIAGNOSIS — Z952 Presence of prosthetic heart valve: Secondary | ICD-10-CM

## 2018-04-28 DIAGNOSIS — Z86711 Personal history of pulmonary embolism: Secondary | ICD-10-CM

## 2018-04-28 DIAGNOSIS — Z8546 Personal history of malignant neoplasm of prostate: Secondary | ICD-10-CM | POA: Insufficient documentation

## 2018-04-28 DIAGNOSIS — E782 Mixed hyperlipidemia: Secondary | ICD-10-CM

## 2018-04-28 DIAGNOSIS — I25810 Atherosclerosis of coronary artery bypass graft(s) without angina pectoris: Secondary | ICD-10-CM

## 2018-04-28 DIAGNOSIS — J449 Chronic obstructive pulmonary disease, unspecified: Secondary | ICD-10-CM | POA: Insufficient documentation

## 2018-04-28 DIAGNOSIS — R4182 Altered mental status, unspecified: Secondary | ICD-10-CM | POA: Diagnosis present

## 2018-04-28 DIAGNOSIS — I10 Essential (primary) hypertension: Secondary | ICD-10-CM | POA: Diagnosis not present

## 2018-04-28 DIAGNOSIS — Z79899 Other long term (current) drug therapy: Secondary | ICD-10-CM | POA: Diagnosis not present

## 2018-04-28 DIAGNOSIS — E785 Hyperlipidemia, unspecified: Secondary | ICD-10-CM

## 2018-04-28 LAB — CBC
HEMATOCRIT: 47.1 % (ref 39.0–52.0)
HEMOGLOBIN: 15.7 g/dL (ref 13.0–17.0)
MCH: 32.5 pg (ref 26.0–34.0)
MCHC: 33.3 g/dL (ref 30.0–36.0)
MCV: 97.5 fL (ref 78.0–100.0)
Platelets: 155 10*3/uL (ref 150–400)
RBC: 4.83 MIL/uL (ref 4.22–5.81)
RDW: 12.4 % (ref 11.5–15.5)
WBC: 6.8 10*3/uL (ref 4.0–10.5)

## 2018-04-28 LAB — APTT: aPTT: 35 seconds (ref 24–36)

## 2018-04-28 LAB — DIFFERENTIAL
Abs Immature Granulocytes: 0.1 10*3/uL (ref 0.0–0.1)
BASOS ABS: 0.1 10*3/uL (ref 0.0–0.1)
Basophils Relative: 1 %
Eosinophils Absolute: 0.1 10*3/uL (ref 0.0–0.7)
Eosinophils Relative: 2 %
Immature Granulocytes: 1 %
LYMPHS ABS: 1.4 10*3/uL (ref 0.7–4.0)
Lymphocytes Relative: 21 %
MONOS PCT: 9 %
Monocytes Absolute: 0.6 10*3/uL (ref 0.1–1.0)
NEUTROS PCT: 66 %
Neutro Abs: 4.5 10*3/uL (ref 1.7–7.7)

## 2018-04-28 LAB — COMPREHENSIVE METABOLIC PANEL
ALK PHOS: 46 U/L (ref 38–126)
ALT: 22 U/L (ref 0–44)
AST: 31 U/L (ref 15–41)
Albumin: 3.8 g/dL (ref 3.5–5.0)
Anion gap: 9 (ref 5–15)
BILIRUBIN TOTAL: 1.5 mg/dL — AB (ref 0.3–1.2)
BUN: 26 mg/dL — AB (ref 8–23)
CALCIUM: 9.1 mg/dL (ref 8.9–10.3)
CO2: 24 mmol/L (ref 22–32)
CREATININE: 1.67 mg/dL — AB (ref 0.61–1.24)
Chloride: 101 mmol/L (ref 98–111)
GFR calc Af Amer: 42 mL/min — ABNORMAL LOW (ref >60)
GFR, EST NON AFRICAN AMERICAN: 36 mL/min — AB (ref >60)
Glucose, Bld: 110 mg/dL — ABNORMAL HIGH (ref 70–99)
Potassium: 4.5 mmol/L (ref 3.5–5.1)
Sodium: 134 mmol/L — ABNORMAL LOW (ref 135–145)
TOTAL PROTEIN: 6.6 g/dL (ref 6.5–8.1)

## 2018-04-28 LAB — I-STAT CHEM 8, ED
BUN: 30 mg/dL — AB (ref 8–23)
CREATININE: 1.6 mg/dL — AB (ref 0.61–1.24)
Calcium, Ion: 1.14 mmol/L — ABNORMAL LOW (ref 1.15–1.40)
Chloride: 101 mmol/L (ref 98–111)
Glucose, Bld: 104 mg/dL — ABNORMAL HIGH (ref 70–99)
HCT: 45 % (ref 39.0–52.0)
Hemoglobin: 15.3 g/dL (ref 13.0–17.0)
Potassium: 4.5 mmol/L (ref 3.5–5.1)
Sodium: 136 mmol/L (ref 135–145)
TCO2: 25 mmol/L (ref 22–32)

## 2018-04-28 LAB — PROTIME-INR
INR: 1.98
Prothrombin Time: 22.4 seconds — ABNORMAL HIGH (ref 11.4–15.2)

## 2018-04-28 LAB — I-STAT TROPONIN, ED: TROPONIN I, POC: 0.08 ng/mL (ref 0.00–0.08)

## 2018-04-28 LAB — GLUCOSE, CAPILLARY: Glucose-Capillary: 104 mg/dL — ABNORMAL HIGH (ref 70–99)

## 2018-04-28 MED ORDER — STROKE: EARLY STAGES OF RECOVERY BOOK
Freq: Once | Status: AC
  Administered 2018-04-28: 1
  Filled 2018-04-28: qty 1

## 2018-04-28 MED ORDER — WARFARIN SODIUM 3 MG PO TABS
3.0000 mg | ORAL_TABLET | Freq: Once | ORAL | Status: DC
  Filled 2018-04-28: qty 1

## 2018-04-28 MED ORDER — SENNOSIDES-DOCUSATE SODIUM 8.6-50 MG PO TABS: 1.0000 | ORAL_TABLET | Freq: Every evening | ORAL | Status: DC | PRN

## 2018-04-28 MED ORDER — ACETAMINOPHEN 650 MG RE SUPP: 650.0000 mg | RECTAL | Status: DC | PRN

## 2018-04-28 MED ORDER — ACETAMINOPHEN 325 MG PO TABS: 650.0000 mg | ORAL_TABLET | ORAL | Status: DC | PRN

## 2018-04-28 MED ORDER — WARFARIN - PHARMACIST DOSING INPATIENT: Freq: Every day | Status: DC

## 2018-04-28 MED ORDER — SODIUM CHLORIDE 0.9 % IV BOLUS
500.0000 mL | Freq: Once | INTRAVENOUS | Status: AC
  Administered 2018-04-28: 500 mL via INTRAVENOUS

## 2018-04-28 MED ORDER — ACETAMINOPHEN 160 MG/5ML PO SOLN: 650.0000 mg | ORAL | Status: DC | PRN

## 2018-04-28 MED ORDER — ASPIRIN EC 81 MG PO TBEC
81.0000 mg | DELAYED_RELEASE_TABLET | Freq: Every day | ORAL | Status: DC
  Administered 2018-04-28 – 2018-04-29 (×2): 81 mg via ORAL
  Filled 2018-04-28 (×2): qty 1

## 2018-04-28 MED ORDER — WARFARIN SODIUM 3 MG PO TABS
3.0000 mg | ORAL_TABLET | ORAL | Status: AC
  Administered 2018-04-28: 3 mg via ORAL
  Filled 2018-04-28: qty 1

## 2018-04-28 NOTE — H&P (Signed)
Date: 04/28/2018               Patient Name:  Curtis Brennan MRN: [id number]  DOB: 1931-08-30 Age / Sex: 84 y.o., male   PCP: [name], MD         Medical Service: Internal Medicine Teaching Service         Attending Physician: Dr. Annia Belt, MD    First Contact: Dr. Laural Golden, [place] Pager: [phone]  Second Contact: Dr. Kalman Shan Pager: [phone]       After Hours (After 5p/  First Contact Pager: [phone]  weekends / holidays): Second Contact Pager: [phone]   Chief Complaint: Memory loss  History of Present Illness: Curtis Brennan is an 84 yo male with a PMHx of HTN, HLD, COPD, prior pulmonary emboli, CAD s/p directional coronary atherectomy, CABG, AVR for AS, chronic anticoagulation on coumadin, asymptomatic moderate left internal carotid artery stenosis and prostate cancer presenting with acute onset of memory loss. He said he was resting in his recliner when he suddenly could not remember any of his family members names. He was able to speak but for ~10 minutes could not recall anyone's names. He said he recognized everyone but could not think of their names. His wife told him to go lay down and his symptoms resolved in ten minutes. His family denied any slurred speech but noticed very slight facial droop. He noticed a headache leading up to the episode. He has also noticed blurred vision for the past few months as well as generalized weakness. His older brother had a stroke but he denied any other family history of stroke. He has never had symptoms like this before or memory loss in the past. He reported he has been taking all of his medications as prescribed.   In the ED, he was mildly hypertensive, saturating well on room air, alert and oriented x4 and back to his baseline according to family. CT head showed no acute abnormality, chronic microvascular ischemic change and atherosclerosis.   Meds:  Current Meds  Medication Sig  . amoxicillin (AMOXIL) 500 MG capsule  Take 4 tablets one hour prior to dental appointment (Patient taking differently: Take 2,000 mg by mouth See admin instructions. Take 4 capsules (2,000 mg) by mouth one hour prior to dental appointments.)  . aspirin EC 81 MG tablet Take 81 mg by mouth at bedtime.  [name] lisinopril (PRINIVIL,ZESTRIL) 20 MG tablet TAKE 1 TABLET BY MOUTH DAILY (Patient taking differently: Take 20 mg by mouth at bedtime. )  . metoprolol tartrate (LOPRESSOR) 25 MG tablet Take 1.5 tablets (37.5 mg total) by mouth 2 (two) times daily. (Patient taking differently: Take 25 mg by mouth 2 (two) times daily. )  . NITROSTAT 0.4 MG SL tablet TAKE 1 TABLET UNDER THE TONGUE AS NEEDEDFOR CHEST PAIN (MAY REPEAT EVERY 5 MIN X3 DOSES, CALL 911 IF NO RELIEF.) (Patient taking differently: Place 0.4 mg under the tongue every 5 (five) minutes as needed for chest pain. )  . silver sulfADIAZINE (SILVADENE) 1 % cream Apply 1 application topically daily as needed (wound care).  . vitamin B-12 (CYANOCOBALAMIN) 1000 MCG tablet Take 1,000 mcg by mouth daily.  [name] warfarin (COUMADIN) 5 MG tablet Take 2.5-5 mg by mouth See admin instructions. Take 1/2 tablet (2.5 mg) by mouth Sunday, Tuesday, Thursday evening, take 1 tablet (5 mg) Monday, Wednesday, Friday, Saturday evening.     Allergies: Allergies as of 04/28/2018 - Review Complete 04/28/2018  Allergen Reaction Noted  .  Atorvastatin Other (See Comments) 02/04/2013  . Sulfa antibiotics Rash 10/28/2008  . Sulfonamide derivatives Rash 10/28/2008   Past Medical History:  Diagnosis Date  . Angina   . AS (aortic stenosis) moderate to severe by cath 12/14/11 for AVR  12/15/2011  . CAD (coronary artery disease) with history of LAD & Diag athrectomy in 1994, now with severe 3 vesseal CAD by cath 12/14/11 for CABG 12/15/2011  . Carotid artery disease, bilateral 12/15/2011  . Chronic anticoagulation, on coumadin for pul emboli 12/15/2011  . Complication of anesthesia 07/1997   "just wouldn't wake wakeup"  .  Coronary artery disease   . Exertional dyspnea   . Heart murmur   . Hypertension   . Kidney stones   . Legionnaire's disease ([place]) 1986  . Pneumonia    "once"  . PONV (postoperative nausea and vomiting)   . Prostate cancer ([place])   . Pulmonary embolism ([place]) ~ 2011   "both lungs"  . Unstable angina ([place]) 12/15/2011    Family History:  No family history on file.  Social History:  Social History   Socioeconomic History  . Marital status: Married    Spouse name: Not on file  . Number of children: Not on file  . Years of education: Not on file  . Highest education level: Not on file  Occupational History  . Not on file  Social Needs  . Financial resource strain: Not on file  . Food insecurity:    Worry: Not on file    Inability: Not on file  . Transportation needs:    Medical: Not on file    Non-medical: Not on file  Tobacco Use  . Smoking status: Never Smoker  . Smokeless tobacco: Never Used  Substance and Sexual Activity  . Alcohol use: No  . Drug use: No  . Sexual activity: Yes  Lifestyle  . Physical activity:    Days per week: Not on file    Minutes per session: Not on file  . Stress: Not on file  Relationships  . Social connections:    Talks on phone: Not on file    Gets together: Not on file    Attends religious service: Not on file    Active member of club or organization: Not on file    Attends meetings of clubs or organizations: Not on file    Relationship status: Not on file  . Intimate partner violence:    Fear of current or ex partner: Not on file    Emotionally abused: Not on file    Physically abused: Not on file    Forced sexual activity: Not on file  Other Topics Concern  . Not on file  Social History Narrative  . Not on file    Review of Systems: A complete ROS was negative except as per HPI.   Physical Exam: Blood pressure 120/62, pulse 82, temperature 97.7 F (36.5 C), temperature source Oral, resp. rate 20, height 5\' 9"  (1.753 m),  weight 85.3 kg, SpO2 95 %.  General- seen laying in bed in NAD Heart- RRR, bilateral carotid bruits, no murmurs Lungs- CTA bilaterally, normal effort Neuro- alert and oriented x3, bilateral upper and lower extremity reflexes intact, CN III-X intact, bilateral upper and lower extremity strength intact, finger to nose test intact, reflexes intact bilaterally Abdomen- non distended, non tender, bowel sounds present  Extremities- mild bilateral +1 pitting edema  EKG: personally reviewed my interpretation is sinus rhythm, no ST segment elevations  CXR: n/a  Assessment & Plan by Problem: Active Problems:   TIA (transient ischemic attack)  Curtis Brennan is an 84 yo male with a PMHx of HTN, HLD, COPD, prior pulmonary emboli, CAD s/p directional coronary atherectomy, CABG, AVR for AS, chronic anticoagulation on coumadin, asymptomatic moderate left internal carotid artery stenosis and prostate cancer presenting with acute onset of memory loss.   TIA - acute onset of memory loss, resolved in ~10 min; preceding headache today; blurred vision and generalized weakness for a couple months - CT head showed no acute abnormality, chronic microvascular ischemic change, atherosclerosis  - f/u MRI, MRA of head w/o and neck with contrast  - f/u Hgb A1c, fasting lipid panel - f/u echo - PT/OT/Speech consult  - Stroke swallow screen - ASA  - Neuro on board, appreciate recommendations - consider getting vascular on board for left ICA stenosis  AKI - Cr on admission 1.67, Cr from 6 years ago 1.06; BUN 26 - f/u bmet   Hx of CABG 11/2011 CAD - CAD s/p directional coronary atherectomy; ultimately underwent CABG x3 in 2013 with a LIMA to his LAD and tissue AVR for aortic stenosis - Doppler studies performed 10/17/17 revealing moderately severe left ICA stenosis 60-79%, right ICA 1-39% stenosis; followed annually  - f/u vascular carotid US  - chronic anticoagulation therapy on coumadin; INR 1.98 - cardiac  monitoring   HLD - Intolerant to statin therapy; severe muscle cramps - last lipid panel from 2017 wnl - f/u lipid panel   HTN - hold home medications lisinopril and metoprolol; permissive HTN   Hx of prior PE  - on coumadin anticoagulation followed by PCP for the past five years   Diet: Heart Healthy once passes stroke swallow evaluation DVT Prophylaxis: Coumadin Full code  Dispo: Admit patient to Inpatient with expected length of stay greater than 2 midnights.  SignedMike Craze, DO 04/28/2018, 6:39 PM  Pager: [phone]

## 2018-04-28 NOTE — ED Triage Notes (Signed)
Per [facility]: Patient to ED from home for possible TIA - at 1230, patient reported by family to have sudden onset confusion, memory loss, facial droop?, and inability to recognize family. This episode lasted approximately 10-15 minutes and patient does recall losing his memory. Upon EMS arrival, patient at baseline. He reports mild headache with symptoms that has since resolved. Patient A&O x 4, no extremity weakness, facial droop, speech or vision changes. EMS VS: 143/72, HR 76 NSR, RR 18, 97% RA, CBG 102. 20g. PIV LAC.

## 2018-04-28 NOTE — Progress Notes (Signed)
ANTICOAGULATION CONSULT NOTE - Follow Up Consult  Pharmacy Consult for Warfarin Indication: PE history   Allergies  Allergen Reactions  . Atorvastatin Other (See Comments)    myalgias  . Sulfa Antibiotics Rash    Pt does not recall a rash from any medication  . Sulfonamide Derivatives Rash    Pt does not recall a rash from any medication    Patient Measurements: Height: 5\' 9"  (175.3 cm) Weight: 188 lb (85.3 kg) IBW/kg (Calculated) : 70.7   Vital Signs: Temp: 97.7 F (36.5 C) (10/06 1412) Temp Source: Oral (10/06 1412) BP: 120/62 (10/06 1800) Pulse Rate: 82 (10/06 1800)  Labs: Recent Labs    04/28/18 1422 04/28/18 1432  HGB 15.7 15.3  HCT 47.1 45.0  PLT 155  --   APTT 35  --   LABPROT 22.4*  --   INR 1.98  --   CREATININE 1.67* 1.60*    Estimated Creatinine Clearance: 37.2 mL/min (A) (by C-G formula based on SCr of 1.6 mg/dL (H)).  Assessment: 84 year old male to continue warfarin as prior to admission for PE history Admitted with TIA INR on admission = 1.98 Dose prior to admission = 2.5 mg TRSun, 5 mg other days Last dose 10/5  Goal of Therapy:  INR 2-3 Monitor platelets by anticoagulation protocol: Yes   Plan:  Warfarin 3 mg po x 1 tonight  Daily INR  Thank you Anette Guarneri, PharmD [phone]  04/28/2018,6:21 PM

## 2018-04-28 NOTE — ED Notes (Signed)
Patient transported to CT 

## 2018-04-28 NOTE — Consult Note (Addendum)
NEURO HOSPITALIST  CONSULT   Requesting Physician: Dr. Reather Converse    Chief Complaint: confusion, memory loss, facial droop  History obtained from:  Patient / family HPI:                                                                                                                                         Curtis Brennan is an 84 y.o. male with PMH of HTN, CAD, PE ( coumadin), aortic stenosis presents to [facility] ed for confusion, memory loss, facial droop.  Per daughters and patient: he woke up this morning in his usual state of health. Took an afternoon nap and then woke up to have lunch. When he went to have lunch he could not remember his daughters names. He was able to talk, and answer questions, but he could not remember their names. he recognized who they were and knew they existed, but could not remember their names. They are unsure if there was any facial droop, but one daughter states that she noticed some slurred speech. She states that it could have been him just talking funny because he was upset that he could not remember their names. This entire episode lasted 10 minutes. The only thing that persist is a HA at the top of his head which he describes as ' the top of his head is sore". No prior stroke history. Denies any CP, SOB, weakness, numbness, vision changes. Patient on coumadin and ASA daily.   ED course:  CT head: no acute abnormality BP: 141/80, BG:104, Creatinine: 1.60, BUN: 30  Date last known well: Date: 04/28/2018 Time last known well: Time: 12:30 tPA Given: No: on coumadin Modified Rankin: Rankin Score=1 NIHSS:0    Past Medical History:  Diagnosis Date  . Angina   . AS (aortic stenosis) moderate to severe by cath 12/14/11 for AVR  12/15/2011  . CAD (coronary artery disease) with history of LAD & Diag athrectomy in 1994, now with severe 3 vesseal CAD by cath 12/14/11 for CABG 12/15/2011  . Carotid artery disease, bilateral  12/15/2011  . Chronic anticoagulation, on coumadin for pul emboli 12/15/2011  . Complication of anesthesia 07/1997   "just wouldn't wake wakeup"  . Coronary artery disease   . Exertional dyspnea   . Heart murmur   . Hypertension   . Kidney stones   . Legionnaire's disease ([place]) 1986  . Pneumonia    "once"  . PONV (postoperative nausea and vomiting)   . Prostate cancer ([place])   . Pulmonary embolism ([place]) ~ 2011   "both lungs"  . Unstable angina ([place]) 12/15/2011    Past Surgical History:  Procedure Laterality Date  . AORTIC VALVE REPLACEMENT  12/20/2011   Procedure: AORTIC VALVE REPLACEMENT (AVR);  Surgeon: Grace Isaac, MD;  Location: [place];  Service: Open Heart Surgery;  Laterality: N/A;  . CARDIAC CATHETERIZATION  12/14/11  . CHOLECYSTECTOMY  2001  . CORONARY ANGIOPLASTY  1994   "roto rooter"  . CORONARY ARTERY BYPASS GRAFT  12/20/2011   Procedure: CORONARY ARTERY BYPASS GRAFTING (CABG);  Surgeon: Grace Isaac, MD;  Location: [place];  Service: Open Heart Surgery;  Laterality: N/A;  . CYSTOSCOPY  12/20/2011   Procedure: CYSTOSCOPY;  Surgeon: Fredricka Bonine, MD;  Location: [place];  Service: Urology;  Laterality: N/A;  . gallstone removed  ~ 2010   "was lodged below pancreatis"  . LEFT HEART CATHETERIZATION WITH CORONARY ANGIOGRAM N/A 12/14/2011   Procedure: LEFT HEART CATHETERIZATION WITH CORONARY ANGIOGRAM;  Surgeon: [name], MD;  Location: [facility] CATH LAB;  Service: Cardiovascular;  Laterality: N/A;  . LITHOTRIPSY  2006  . PROSTATE SURGERY  07/1997  . RIGHT HEART CATHETERIZATION  12/14/2011   Procedure: RIGHT HEART CATH;  Surgeon: [name], MD;  Location: [facility] CATH LAB;  Service: Cardiovascular;;    No family history on file.       Social History:  reports that he has never smoked. He has never used smokeless tobacco. He reports that he does not drink alcohol or use drugs.  Allergies:  Allergies  Allergen Reactions  . Atorvastatin Other (See Comments)     myalgias  . Sulfa Antibiotics Rash    Pt does not recall a rash from any medication  . Sulfonamide Derivatives Rash    Pt does not recall a rash from any medication    Medications:                                                                                                                           Current Facility-Administered Medications  Medication Dose Route Frequency Provider Last Rate Last Dose  . sodium chloride 0.9 % bolus 500 mL  500 mL Intravenous Once Elnora Morrison, MD 491.8 mL/hr at 04/28/18 1553 500 mL at 04/28/18 1553   Current Outpatient Medications  Medication Sig Dispense Refill  . amoxicillin (AMOXIL) 500 MG capsule Take 4 tablets one hour prior to dental appointment (Patient taking differently: Take 2,000 mg by mouth See admin instructions. Take 4 capsules (2,000 mg) by mouth one hour prior to dental appointments.) 4 capsule 1  . aspirin EC 81 MG tablet Take 81 mg by mouth at bedtime.    [name] lisinopril (PRINIVIL,ZESTRIL) 20 MG tablet TAKE 1 TABLET BY MOUTH DAILY (Patient taking differently: Take 20 mg by mouth at bedtime. ) 90 tablet 3  . metoprolol tartrate (LOPRESSOR) 25 MG tablet Take 1.5 tablets (37.5 mg total) by mouth 2 (two) times daily. (Patient taking differently: Take 25 mg by mouth 2 (two) times daily. ) 270 tablet 3  . NITROSTAT 0.4 MG SL tablet TAKE  1 TABLET UNDER THE TONGUE AS NEEDEDFOR CHEST PAIN (MAY REPEAT EVERY 5 MIN X3 DOSES, CALL 911 IF NO RELIEF.) (Patient taking differently: Place 0.4 mg under the tongue every 5 (five) minutes as needed for chest pain. ) 25 tablet 3  . silver sulfADIAZINE (SILVADENE) 1 % cream Apply 1 application topically daily as needed (wound care).    . vitamin B-12 (CYANOCOBALAMIN) 1000 MCG tablet Take 1,000 mcg by mouth daily.    [name] warfarin (COUMADIN) 5 MG tablet Take 2.5-5 mg by mouth See admin instructions. Take 1/2 tablet (2.5 mg) by mouth Sunday, Tuesday, Thursday evening, take 1 tablet (5 mg) Monday, Wednesday, Friday,  Saturday evening.       ROS:                                                                                                                                       ROS was performed and is negative except as noted in HPI    General Examination:                                                                                                      Blood pressure (!) 141/80, pulse 71, temperature 97.7 F (36.5 C), temperature source Oral, resp. rate 14, height 5\' 9"  (1.753 m), weight 85.3 kg, SpO2 98 %.  HEENT-  Normocephalic, no lesions, without obvious abnormality.  Normal external eye and conjunctiva.  Cardiovascular- S1-S2 audible, pulses palpable throughout   Lungs-no rhonchi or wheezing noted, no excessive working breathing.  Saturations within normal limits on RA Abdomen- All 4 quadrants palpated and nontender Extremities- Warm, dry and intact Musculoskeletal-no joint tenderness, deformity or swelling Skin-warm and dry, no hyperpigmentation, vitiligo, or suspicious lesions  Neurological Examination Mental Status: Alert, oriented, thought content appropriate.  Speech fluent without evidence of aphasia.  Able to follow  commands without difficulty. Cranial Nerves: II:  Visual fields grossly normal,  III,IV, VI: ptosis not present, extra-ocular motions intact bilaterally, pupils equal, round, reactive to light and accommodation V,VII: smile symmetric, facial light touch sensation normal bilaterally VIII:  bilateral hearing aides IX,X: uvula rises symmetrically XI: bilateral shoulder shrug XII: midline tongue extension Motor: Right : Upper extremity   5/5 Left:     Upper extremity   5/5 Lower extremity   5/5  Lower extremity   5/5 Tone and bulk:normal tone throughout; no atrophy noted Sensory: Pinprick and light touch intact throughout, bilaterally Deep Tendon Reflexes: 2+ and symmetric biceps Plantars: Right: downgoing  Left: downgoing Cerebellar: normal finger-to-nose, normal  rapid alternating movements and normal heel-to-shin test Gait: deferred   Lab Results: Basic Metabolic Panel: Recent Labs  Lab 04/28/18 1422 04/28/18 1432  NA 134* 136  K 4.5 4.5  CL 101 101  CO2 24  --   GLUCOSE 110* 104*  BUN 26* 30*  CREATININE 1.67* 1.60*  CALCIUM 9.1  --     CBC: Recent Labs  Lab 04/28/18 1422 04/28/18 1432  WBC 6.8  --   NEUTROABS 4.5  --   HGB 15.7 15.3  HCT 47.1 45.0  MCV 97.5  --   PLT 155  --     Imaging: Ct Head Wo Contrast  Result Date: 04/28/2018 CLINICAL DATA:  Acute onset confusion, memory loss and facial droop at 12:30 p.m. today. EXAM: CT HEAD WITHOUT CONTRAST TECHNIQUE: Contiguous axial images were obtained from the base of the skull through the vertex without intravenous contrast. COMPARISON:  Head CT scan 07/09/2009. FINDINGS: Brain: No evidence of acute infarction, hemorrhage, hydrocephalus, extra-axial collection or mass lesion/mass effect. Hypoattenuation in the periventricular and subcortical deep white matter compatible with chronic microvascular ischemic change shows some progression since the prior exam. Vascular: Extensive atherosclerosis noted. Skull: Intact. Sinuses/Orbits: Negative. Other: None. IMPRESSION: No acute abnormality. Chronic microvascular ischemic change. Atherosclerosis. Electronically Signed   By: Inge Rise M.D.   On: 04/28/2018 14:58    Laurey Morale, MSN, NP-C [facility] [phone]  04/28/2018, 4:18 PM   Attending physician note to follow with Assessment and plan . I have seen the patient and reviewed the above note.  I do not think that his problem was one of memory or confusion, given that he knew who everyone was, but simply could not come up with the names.  He did not try to talk during this timeframe and know what else was around him to see if he was having difficulties.   Assessment: 84 y.o. male with PMH of HTN, CAD, PE (coumadin), aortic stenosis presents to [facility] ED for  what sounds most consistent with anatomic aphasia.  Unfortunately without him talking during that timeframe, it is difficult to be certain but given his description of something that definitely seemed unusual in the fact that was limited to coming up with specific words makes me think that this is most likely TIA.  He does have a history of carotid stenosis on the left, and I do think that this event would lend credence to the idea that this is actually a symptomatic stenosis.  Certainly if this is worsened since his last study, then I think this would need to be considered.   Recommendations: --MRI Brain  --MRA of the head and carotid dopplers --Echocardiogram --Continue anticoagulation and aspirin as at home -- High intensity statin if LDL > 70  -- HgbA1c, fasting lipid panel -- PT consult, OT consult, Speech consult --Telemetry monitoring --Frequent neuro checks --Stroke swallow screen  --please page stroke NP  Or  PA  Or MD from 8am -4 pm  as this patient from this time will be  followed by the stroke.   You can look them up on [email or web address]  Password [email or web address]  Roland Rack, MD [facility] [phone]  If 7pm- 7am, please page neurology on call as listed in [place].

## 2018-04-28 NOTE — ED Provider Notes (Signed)
[facility] EMERGENCY DEPARTMENT Provider Note   CSN: [id number] Arrival date & time: 04/28/18  1405     History   Chief Complaint Chief Complaint  Patient presents with  . Transient Ischemic Attack    HPI Curtis Brennan is a 84 y.o. male.  Patient with history of aortic valve replacement, CABG, CAD, high blood pressure, pulmonary embolism on Coumadin presents after neurologic symptoms.  Patient is with family they noticed he did not know their names or family members or typical things that he would know.  This lasted about 15 minutes with mild general confusion.  Symptoms and signs resolved.     Past Medical History:  Diagnosis Date  . Angina   . AS (aortic stenosis) moderate to severe by cath 12/14/11 for AVR  12/15/2011  . CAD (coronary artery disease) with history of LAD & Diag athrectomy in 1994, now with severe 3 vesseal CAD by cath 12/14/11 for CABG 12/15/2011  . Carotid artery disease, bilateral 12/15/2011  . Chronic anticoagulation, on coumadin for pul emboli 12/15/2011  . Complication of anesthesia 07/1997   "just wouldn't wake wakeup"  . Coronary artery disease   . Exertional dyspnea   . Heart murmur   . Hypertension   . Kidney stones   . Legionnaire's disease ([place]) 1986  . Pneumonia    "once"  . PONV (postoperative nausea and vomiting)   . Prostate cancer ([place])   . Pulmonary embolism ([place]) ~ 2011   "both lungs"  . Unstable angina ([place]) 12/15/2011    Patient Active Problem List   Diagnosis Date Noted  . Bilateral lower extremity edema 11/30/2017  . PAF s/p CABG/AVR 12/26/2011  . Unstable angina ([place]) 12/26/2011  . COPD by CXR 12/16/2011  . Hx of CABG 12/15/2011  . AS, s/p tissue AVR 12/20/11 12/15/2011  . Chronic anticoagulation, on coumadin for pul emboli 12/15/2011  . Carotid artery disease, bilateral, asymptomatic 12/15/2011  . Dyslipidemia 10/28/2008  . Essential hypertension 10/28/2008  . History of prior pulm embolism 10/28/2008    . PULMONARY HYPERTENSION 10/28/2008    Past Surgical History:  Procedure Laterality Date  . AORTIC VALVE REPLACEMENT  12/20/2011   Procedure: AORTIC VALVE REPLACEMENT (AVR);  Surgeon: Grace Isaac, MD;  Location: [place];  Service: Open Heart Surgery;  Laterality: N/A;  . CARDIAC CATHETERIZATION  12/14/11  . CHOLECYSTECTOMY  2001  . CORONARY ANGIOPLASTY  1994   "roto rooter"  . CORONARY ARTERY BYPASS GRAFT  12/20/2011   Procedure: CORONARY ARTERY BYPASS GRAFTING (CABG);  Surgeon: Grace Isaac, MD;  Location: [place];  Service: Open Heart Surgery;  Laterality: N/A;  . CYSTOSCOPY  12/20/2011   Procedure: CYSTOSCOPY;  Surgeon: Fredricka Bonine, MD;  Location: [place];  Service: Urology;  Laterality: N/A;  . gallstone removed  ~ 2010   "was lodged below pancreatis"  . LEFT HEART CATHETERIZATION WITH CORONARY ANGIOGRAM N/A 12/14/2011   Procedure: LEFT HEART CATHETERIZATION WITH CORONARY ANGIOGRAM;  Surgeon: [name], MD;  Location[facility] CATH LAB;  Service: Cardiovascular;  Laterality: N/A;  . LITHOTRIPSY  2006  . PROSTATE SURGERY  07/1997  . RIGHT HEART CATHETERIZATION  12/14/2011   Procedure: RIGHT HEART CATH;  Surgeon: [name], MD;  Location: [facility] CATH LAB;  Service: Cardiovascular;;        Home Medications    Prior to Admission medications   Medication Sig Start Date End Date Taking? Authorizing Provider  amoxicillin (AMOXIL) 500 MG capsule Take 4  tablets one hour prior to dental appointment Patient taking differently: Take 2,000 mg by mouth See admin instructions. Take 4 capsules (2,000 mg) by mouth one hour prior to dental appointments. 04/28/14  Yes [name], MD  aspirin EC 81 MG tablet Take 81 mg by mouth at bedtime.   Yes [provider]  lisinopril (PRINIVIL,ZESTRIL) 20 MG tablet TAKE 1 TABLET BY MOUTH DAILY Patient taking differently: Take 20 mg by mouth at bedtime.  10/29/17  Yes [name], MD  metoprolol tartrate (LOPRESSOR) 25 MG  tablet Take 1.5 tablets (37.5 mg total) by mouth 2 (two) times daily. Patient taking differently: Take 25 mg by mouth 2 (two) times daily.  07/26/17 07/21/18 Yes Kilroy, Luke K, PA-C  NITROSTAT 0.4 MG SL tablet TAKE 1 TABLET UNDER THE TONGUE AS NEEDEDFOR CHEST PAIN (MAY REPEAT EVERY 5 MIN X3 DOSES, CALL 911 IF NO RELIEF.) Patient taking differently: Place 0.4 mg under the tongue every 5 (five) minutes as needed for chest pain.  10/23/14  Yes [name], MD  silver sulfADIAZINE (SILVADENE) 1 % cream Apply 1 application topically daily as needed (wound care).   Yes [provider]  vitamin B-12 (CYANOCOBALAMIN) 1000 MCG tablet Take 1,000 mcg by mouth daily.   Yes [provider]  warfarin (COUMADIN) 5 MG tablet Take 2.5-5 mg by mouth See admin instructions. Take 1/2 tablet (2.5 mg) by mouth Sunday, Tuesday, Thursday evening, take 1 tablet (5 mg) Monday, Wednesday, Friday, Saturday evening. 05/03/17  Yes [provider]    Family History No family history on file.  Social History Social History   Tobacco Use  . Smoking status: Never Smoker  . Smokeless tobacco: Never Used  Substance Use Topics  . Alcohol use: No  . Drug use: No     Allergies   Atorvastatin; Sulfa antibiotics; and Sulfonamide derivatives   Review of Systems Review of Systems  Constitutional: Negative for chills and fever.  HENT: Negative for congestion.   Eyes: Negative for visual disturbance.  Respiratory: Negative for shortness of breath.   Cardiovascular: Negative for chest pain.  Gastrointestinal: Negative for abdominal pain and vomiting.  Genitourinary: Negative for dysuria and flank pain.  Musculoskeletal: Negative for back pain, neck pain and neck stiffness.  Skin: Negative for rash.  Neurological: Positive for speech difficulty and headaches. Negative for dizziness, weakness and light-headedness.  Psychiatric/Behavioral: Positive for confusion.     Physical Exam Updated  Vital Signs BP (!) 179/67   Pulse 69   Temp 97.7 F (36.5 C) (Oral)   Resp 20   Ht 5\' 9"  (1.753 m)   Wt 85.3 kg   SpO2 99%   BMI 27.76 kg/m   Physical Exam  Constitutional: He is oriented to person, place, and time. He appears well-developed and well-nourished.  HENT:  Head: Normocephalic and atraumatic.  Dry mm  Eyes: Conjunctivae are normal. Right eye exhibits no discharge. Left eye exhibits no discharge.  Neck: Normal range of motion. Neck supple. No tracheal deviation present.  Cardiovascular: Normal rate and regular rhythm.  Pulmonary/Chest: Effort normal and breath sounds normal.  Abdominal: Soft. He exhibits no distension. There is no tenderness. There is no guarding.  Musculoskeletal: He exhibits no edema.  Neurological: He is alert and oriented to person, place, and time. GCS eye subscore is 4. GCS verbal subscore is 5. GCS motor subscore is 6.  5+ strength in UE and LE with f/e at major joints. Sensation to palpation intact in UE  and LE. CNs 2-12 grossly intact.  EOMFI.  PERRL.   Finger nose and coordination intact bilateral.   Visual fields intact to finger testing. No nystagmus   Skin: Skin is warm. No rash noted.  Psychiatric: He has a normal mood and affect.  Nursing note and vitals reviewed.    ED Treatments / Results  Labs (all labs ordered are listed, but only abnormal results are displayed) Labs Reviewed  PROTIME-INR - Abnormal; Notable for the following components:      Result Value   Prothrombin Time 22.4 (*)    All other components within normal limits  COMPREHENSIVE METABOLIC PANEL - Abnormal; Notable for the following components:   Sodium 134 (*)    Glucose, Bld 110 (*)    BUN 26 (*)    Creatinine, Ser 1.67 (*)    Total Bilirubin 1.5 (*)    GFR calc non Af Amer 36 (*)    GFR calc Af Amer 42 (*)    All other components within normal limits  I-STAT CHEM 8, ED - Abnormal; Notable for the following components:   BUN 30 (*)    Creatinine, Ser  1.60 (*)    Glucose, Bld 104 (*)    Calcium, Ion 1.14 (*)    All other components within normal limits  APTT  CBC  DIFFERENTIAL  I-STAT TROPONIN, ED  CBG MONITORING, ED    EKG EKG Interpretation  Date/Time:  Sunday April 28 2018 14:11:22 EDT Ventricular Rate:  72 PR Interval:    QRS Duration: 103 QT Interval:  402 QTC Calculation: 440 R Axis:   58 Text Interpretation:  Sinus rhythm Confirmed by Elnora Morrison [id number]) on 04/28/2018 4:00:44 PM   Radiology Ct Head Wo Contrast  Result Date: 04/28/2018 CLINICAL DATA:  Acute onset confusion, memory loss and facial droop at 12:30 p.m. today. EXAM: CT HEAD WITHOUT CONTRAST TECHNIQUE: Contiguous axial images were obtained from the base of the skull through the vertex without intravenous contrast. COMPARISON:  Head CT scan 07/09/2009. FINDINGS: Brain: No evidence of acute infarction, hemorrhage, hydrocephalus, extra-axial collection or mass lesion/mass effect. Hypoattenuation in the periventricular and subcortical deep white matter compatible with chronic microvascular ischemic change shows some progression since the prior exam. Vascular: Extensive atherosclerosis noted. Skull: Intact. Sinuses/Orbits: Negative. Other: None. IMPRESSION: No acute abnormality. Chronic microvascular ischemic change. Atherosclerosis. Electronically Signed   By: Inge Rise M.D.   On: 04/28/2018 14:58    Procedures Procedures (including critical care time)  Medications Ordered in ED Medications  sodium chloride 0.9 % bolus 500 mL (500 mLs Intravenous New Bag/Given 04/28/18 1553)     Initial Impression / Assessment and Plan / ED Course  I have reviewed the triage vital signs and the nursing notes.  Pertinent labs & imaging results that were available during my care of the patient were reviewed by me and considered in my medical decision making (see chart for details).    Patient presents with clinically TIA symptoms and signs lasting 15 minutes.   Patient at baseline currently family in the room for discussion.  CT scan of the head no acute findings.  Blood work reviewed showing acute renal failure likely prerenal as patient has not been keeping up with his oral fluids.  IV fluid bolus ordered.  Discussed with neurology recommends observation.  Updated family and paged hospitalist for admission/observation.  The patients results and plan were reviewed and discussed.   Any x-rays performed were independently reviewed by myself.   Differential diagnosis  were considered with the presenting HPI.  Medications  sodium chloride 0.9 % bolus 500 mL (500 mLs Intravenous New Bag/Given 04/28/18 1553)    Vitals:   04/28/18 1430 04/28/18 1437 04/28/18 1530 04/28/18 1630  BP: (!) 165/71  (!) 141/80 (!) 179/67  Pulse: 73  71 69  Resp: (!) 22  14 20   Temp:      TempSrc:      SpO2: 98%  98% 99%  Weight:  85.3 kg    Height:  5\' 9"  (1.753 m)      Final diagnoses:  Memory loss  TIA (transient ischemic attack)  Acute renal failure, unspecified acute renal failure type [facility])    Admission/ observation were discussed with the admitting physician, patient and/or family and they are comfortable with the plan.    Final Clinical Impressions(s) / ED Diagnoses   Final diagnoses:  Memory loss  TIA (transient ischemic attack)  Acute renal failure, unspecified acute renal failure type [facility])    ED Discharge Orders    None       Elnora Morrison, MD 04/28/18 1711

## 2018-04-28 NOTE — Progress Notes (Signed)
Patient B/P high. Paged MD. Order is do nothing now; if systolic greater than 974, give them a call. Explained this to patient and he understood. Warfarin given as well as ASA 81 mg. Bed alarm on, call bell within reach. Safety maintained.

## 2018-04-29 ENCOUNTER — Inpatient Hospital Stay (HOSPITAL_BASED_OUTPATIENT_CLINIC_OR_DEPARTMENT_OTHER): Payer: Medicare Other

## 2018-04-29 ENCOUNTER — Inpatient Hospital Stay (HOSPITAL_COMMUNITY): Payer: Medicare Other

## 2018-04-29 DIAGNOSIS — E782 Mixed hyperlipidemia: Secondary | ICD-10-CM

## 2018-04-29 DIAGNOSIS — E785 Hyperlipidemia, unspecified: Secondary | ICD-10-CM

## 2018-04-29 DIAGNOSIS — Z952 Presence of prosthetic heart valve: Secondary | ICD-10-CM | POA: Diagnosis not present

## 2018-04-29 DIAGNOSIS — I25810 Atherosclerosis of coronary artery bypass graft(s) without angina pectoris: Secondary | ICD-10-CM

## 2018-04-29 DIAGNOSIS — I10 Essential (primary) hypertension: Secondary | ICD-10-CM

## 2018-04-29 DIAGNOSIS — Z7901 Long term (current) use of anticoagulants: Secondary | ICD-10-CM | POA: Diagnosis not present

## 2018-04-29 DIAGNOSIS — Z86711 Personal history of pulmonary embolism: Secondary | ICD-10-CM

## 2018-04-29 DIAGNOSIS — G459 Transient cerebral ischemic attack, unspecified: Secondary | ICD-10-CM

## 2018-04-29 DIAGNOSIS — I5030 Unspecified diastolic (congestive) heart failure: Secondary | ICD-10-CM | POA: Diagnosis not present

## 2018-04-29 LAB — ECHOCARDIOGRAM COMPLETE
HEIGHTINCHES: 69 in
Weight: 2980.62 oz

## 2018-04-29 LAB — PROTIME-INR
INR: 2.05
Prothrombin Time: 23 seconds — ABNORMAL HIGH (ref 11.4–15.2)

## 2018-04-29 LAB — LIPID PANEL
CHOL/HDL RATIO: 4.9 ratio
CHOLESTEROL: 153 mg/dL (ref 0–200)
HDL: 31 mg/dL — ABNORMAL LOW (ref >40)
LDL Cholesterol: 104 mg/dL — ABNORMAL HIGH (ref 0–99)
TRIGLYCERIDES: 91 mg/dL (ref <150)
VLDL: 18 mg/dL (ref 0–40)

## 2018-04-29 LAB — HEMOGLOBIN A1C
Hgb A1c MFr Bld: 6.3 % — ABNORMAL HIGH (ref 4.8–5.6)
MEAN PLASMA GLUCOSE: 134.11 mg/dL

## 2018-04-29 MED ORDER — WARFARIN SODIUM 5 MG PO TABS: 5.0000 mg | ORAL_TABLET | Freq: Once | ORAL | Status: DC

## 2018-04-29 NOTE — Care Management Note (Signed)
Case Management Note  Patient Details  Name: Curtis Brennan MRN: [id number] Date of Birth: [birth year]  Subjective/Objective:    Pt in with TIA. He is from home with spouse. Pt denies issues with transportation and no issues obtaining his medications.                 Action/Plan: Pt discharging home with self care. No changes to his medications. Pt has transportation home.  Expected Discharge Date:  04/29/18               Expected Discharge Plan:  Home/Self Care  In-House Referral:     Discharge planning Services     Post Acute Care Choice:    Choice offered to:     DME Arranged:    DME Agency:     HH Arranged:    HH Agency:     Status of Service:  Completed, signed off  If discussed at [employer] of Stay Meetings, dates discussed:    Additional Comments:  Pollie Friar, RN 04/29/2018, 3:43 PM

## 2018-04-29 NOTE — Progress Notes (Addendum)
STROKE TEAM PROGRESS NOTE   INTERVAL HISTORY His daughter is at the bedside.  He is looking forward to going home as he did not have a stroke. Understands plan to complete stroke workup prior to d/c. Remains without neuro deficits.  Vitals:   04/29/18 0202 04/29/18 0402 04/29/18 0439 04/29/18 0724  BP: 140/84 (!) 85/51 (!) 153/76 (!) 156/79  Pulse: 83 64 72 69  Resp:    18  Temp:    97.7 F (36.5 C)  TempSrc:    Oral  SpO2: 100% 95% 97% 100%  Weight:      Height:        CBC:  Recent Labs  Lab 04/28/18 1422 04/28/18 1432  WBC 6.8  --   NEUTROABS 4.5  --   HGB 15.7 15.3  HCT 47.1 45.0  MCV 97.5  --   PLT 155  --     Basic Metabolic Panel:  Recent Labs  Lab 04/28/18 1422 04/28/18 1432  NA 134* 136  K 4.5 4.5  CL 101 101  CO2 24  --   GLUCOSE 110* 104*  BUN 26* 30*  CREATININE 1.67* 1.60*  CALCIUM 9.1  --    Lipid Panel:     Component Value Date/Time   CHOL 153 04/29/2018 0642   TRIG 91 04/29/2018 0642   HDL 31 (L) 04/29/2018 0642   CHOLHDL 4.9 04/29/2018 0642   VLDL 18 04/29/2018 0642   LDLCALC 104 (H) 04/29/2018 0642   HgbA1c:  Lab Results  Component Value Date   HGBA1C 6.3 (H) 04/29/2018    IMAGING Ct Head Wo Contrast  Result Date: 04/28/2018 CLINICAL DATA:  Acute onset confusion, memory loss and facial droop at 12:30 p.m. today. EXAM: CT HEAD WITHOUT CONTRAST TECHNIQUE: Contiguous axial images were obtained from the base of the skull through the vertex without intravenous contrast. COMPARISON:  Head CT scan 07/09/2009. FINDINGS: Brain: No evidence of acute infarction, hemorrhage, hydrocephalus, extra-axial collection or mass lesion/mass effect. Hypoattenuation in the periventricular and subcortical deep white matter compatible with chronic microvascular ischemic change shows some progression since the prior exam. Vascular: Extensive atherosclerosis noted. Skull: Intact. Sinuses/Orbits: Negative. Other: None. IMPRESSION: No acute abnormality. Chronic  microvascular ischemic change. Atherosclerosis. Electronically Signed   By: Inge Rise M.D.   On: 04/28/2018 14:58   Mr Brain Wo Contrast  Result Date: 04/28/2018 CLINICAL DATA:  Acute onset confusion, memory loss and facial droop. Episode lasted 10 minutes. EXAM: MRI HEAD WITHOUT CONTRAST MRA HEAD WITHOUT CONTRAST TECHNIQUE: Multiplanar, multiecho pulse sequences of the brain and surrounding structures were obtained without intravenous contrast. Angiographic images of the head were obtained using MRA technique without contrast. COMPARISON:  Head CT 04/28/2018 FINDINGS: MRI HEAD FINDINGS Brain: There is no evidence of acute infarct, mass, midline shift, or extra-axial fluid collection. Chronic microhemorrhages are noted in the posterior right frontal lobe and left occipital lobe. Patchy cerebral white matter T2 hyperintensities are nonspecific but compatible with moderate chronic small vessel ischemic disease. There is moderate cerebral atrophy. A chronic lacunar infarct versus dilated perivascular space is noted in the upper right pons. Vascular: Major intracranial vascular flow voids are preserved. Skull and upper cervical spine: Unremarkable bone marrow signal. Sinuses/Orbits: Unremarkable orbits. Clear paranasal sinuses. Small bilateral mastoid effusions. Other: None. MRA HEAD FINDINGS The visualized distal vertebral arteries are patent to the basilar with the left being dominant. PICA origins were not imaged. Patent AICA and SCA origins are identified bilaterally. The basilar artery is widely patent. Posterior  communicating arteries are not identified and may be diminutive or absent. There is a severe stenosis at the right P1-P2 junction. With mild proximal supraclinoid stenosis bilaterally. ACAs and MCAs are patent without evidence of proximal branch occlusion or significant A1 or M1 stenosis. There are severe proximal M2 stenosis bilaterally. No aneurysm is identified. The internal carotid arteries  are patent from skull base to carotid termini. IMPRESSION: 1. No acute intracranial abnormality. 2. Moderate chronic small vessel ischemic disease. 3. Patent circle of Willis without major branch occlusion. 4. Severe proximal right PCA and bilateral proximal M2 stenoses. Electronically Signed   By: Logan Bores M.D.   On: 04/28/2018 22:08   Mr Jodene Nam Head Wo Contrast  Result Date: 04/28/2018 CLINICAL DATA:  Acute onset confusion, memory loss and facial droop. Episode lasted 10 minutes. EXAM: MRI HEAD WITHOUT CONTRAST MRA HEAD WITHOUT CONTRAST TECHNIQUE: Multiplanar, multiecho pulse sequences of the brain and surrounding structures were obtained without intravenous contrast. Angiographic images of the head were obtained using MRA technique without contrast. COMPARISON:  Head CT 04/28/2018 FINDINGS: MRI HEAD FINDINGS Brain: There is no evidence of acute infarct, mass, midline shift, or extra-axial fluid collection. Chronic microhemorrhages are noted in the posterior right frontal lobe and left occipital lobe. Patchy cerebral white matter T2 hyperintensities are nonspecific but compatible with moderate chronic small vessel ischemic disease. There is moderate cerebral atrophy. A chronic lacunar infarct versus dilated perivascular space is noted in the upper right pons. Vascular: Major intracranial vascular flow voids are preserved. Skull and upper cervical spine: Unremarkable bone marrow signal. Sinuses/Orbits: Unremarkable orbits. Clear paranasal sinuses. Small bilateral mastoid effusions. Other: None. MRA HEAD FINDINGS The visualized distal vertebral arteries are patent to the basilar with the left being dominant. PICA origins were not imaged. Patent AICA and SCA origins are identified bilaterally. The basilar artery is widely patent. Posterior communicating arteries are not identified and may be diminutive or absent. There is a severe stenosis at the right P1-P2 junction. With mild proximal supraclinoid stenosis  bilaterally. ACAs and MCAs are patent without evidence of proximal branch occlusion or significant A1 or M1 stenosis. There are severe proximal M2 stenosis bilaterally. No aneurysm is identified. The internal carotid arteries are patent from skull base to carotid termini. IMPRESSION: 1. No acute intracranial abnormality. 2. Moderate chronic small vessel ischemic disease. 3. Patent circle of Willis without major branch occlusion. 4. Severe proximal right PCA and bilateral proximal M2 stenoses. Electronically Signed   By: Logan Bores M.D.   On: 04/28/2018 22:08    PHYSICAL EXAM HEENT-  Normocephalic, no lesions, without obvious abnormality. Normal external eye and conjunctiva.  Extremities- Warm, dry and intact  Neurological Examination Mental Status: Alert, oriented, thought content appropriate.  Speech fluent without evidence of aphasia.  Able to follow  complex commands without difficulty. Cranial Nerves: II:  Visual fields grossly normal - able to count fingers all fields III,IV, VI: ptosis not present, extra-ocular motions intact bilaterally, pupils equal, round, reactive to light and accommodation V,VII: smile symmetric, facial light touch sensation normal bilaterally VIII:  bilateral hearing aides IX,X: uvula rises symmetrically XI: bilateral shoulder shrug XII: midline tongue extension Motor: Right :  Upper extremity   5/5  Left:     Upper extremity   5/5 Lower extremity   5/5              Lower extremity   5/5 Tone and bulk:normal tone throughout; no atrophy noted Sensory: light touch intact throughout, bilaterally Plantars: Right:  downgoing                                Left: downgoing Cerebellar: normal finger-to-nose, normal rapid alternating movements Gait: deferred   ASSESSMENT/PLAN Mr. Braylon Grenda is a 84 y.o. male with history of HTN, CAD s/p CABG, PE on warfarin, aortic stenosis presenting with increased confusion and HA following a nap.   Episodic worsening  confusion without TIA/stroke  CT head No acute stroke. Small vessel disease.   MRI  No acute stroke. Mod Small vessel disease. Severe proximarl R PCA and B M2 stenosis  Carotid Doppler  Right ICA: 1-39% stenosis. Left ICA: 60-79%   2D Echo  pending   LDL 104  HgbA1c 6.3  Warfarin for VTE prophylaxis  aspirin 81 mg daily and warfarin daily prior to admission, now on aspirin 81 mg daily and warfarin daily. Continue at d/c  Therapy recommendations:  No therapy needs  Disposition:  Return home with wife  Hx Bilateral Pulmonary Emboli  On chronic warfarin   INR 2.05  Hypertension  Overall Stable, one documented BP 85/51 . BP goal normotensive  Hyperlipidemia  Home meds:  No statin  LDL 104, goal < 70  intolerant to lipitor - states Dr. Gwenlyn Found has tried all the statins, including pravachol   Defer to DR. Berry for PCSK9 consideration  Other Stroke Risk Factors  Advanced age  35 / ETOH level not performed   Coronary artery disease s/p CABG  Aortic Stenosis s/p AVR  Carotid stenosis, L 60-79% followed yearly by [facility] stable this admission  CAD  Other Active Problems  CKD, Cr 1.6  Ok for d/c from stroke standpoint once 2D completed. No neuro follow-up indicated Stroke Team will sign off  Hospital day # [place], MSN, APRN, ANVP-BC, AGPCNP-BC Advanced Practice Stroke Nurse [place] for Schedule & Pager information 04/29/2018 12:22 PM   ATTENDING NOTE: I reviewed above note and agree with the assessment and plan. Pt was seen and examined.   84 year old male with history of CAD status post CABG, left carotid stenosis, PE on Coumadin, hypertension, prostate cancer admitted for name anomia for 10 minutes after a nap with mild headache.  CT negative for acute findings, MRI negative for acute infarct.  MRI showed right P2 and bilateral M2 stenosis.  2D echo pending.  Carotid Doppler showed left ICA 60 to 79% stenosis.  LDL 104  and A1c 6.3.  INR today 2.05.  On detailed history taking, patient name anomia seems related to, at least partly, stress and anxiety.  Although TIA cannot be completely ruled out, but less likely.  His left ICA 60 to 70% stenosis has been stable since early 2018, he has been following with Dr. Gwenlyn Found for carotid stenosis.  At this time we feel it was asymptomatic, he does need to continue follow-up with Dr. Gwenlyn Found for monitoring.  Given intolerance to statin, recommend PCSK9 inhibitors for further management.  Continue home aspirin and Coumadin with INR goal 2-3.  Neurology will sign off. Please call with questions.  No neurology follow-up as needed at this time. Thanks for the consult.   Rosalin Hawking, MD PhD Stroke Neurology 04/29/2018 6:31 PM     To contact Stroke Continuity provider, please refer to [email or web address] After hours, contact General Neurology

## 2018-04-29 NOTE — Progress Notes (Signed)
ANTICOAGULATION CONSULT NOTE - Follow Up Consult  Pharmacy Consult for Warfarin Indication: PE history   Allergies  Allergen Reactions  . Atorvastatin Other (See Comments)    myalgias  . Sulfa Antibiotics Rash    Pt does not recall a rash from any medication  . Sulfonamide Derivatives Rash    Pt does not recall a rash from any medication    Patient Measurements: Height: 5\' 9"  (175.3 cm) Weight: 186 lb 4.6 oz (84.5 kg) IBW/kg (Calculated) : 70.7   Vital Signs: Temp: 97.7 F (36.5 C) (10/07 0724) Temp Source: Oral (10/07 0724) BP: 156/79 (10/07 0724) Pulse Rate: 69 (10/07 0724)  Labs: Recent Labs    04/28/18 1422 04/28/18 1432 04/29/18 0642  HGB 15.7 15.3  --   HCT 47.1 45.0  --   PLT 155  --   --   APTT 35  --   --   LABPROT 22.4*  --  23.0*  INR 1.98  --  2.05  CREATININE 1.67* 1.60*  --     Estimated Creatinine Clearance: 34.4 mL/min (A) (by C-G formula based on SCr of 1.6 mg/dL (H)).  Assessment: 84 year old male to continue warfarin as prior to admission for PE history Admitted with TIA. INR on admission = 1.98, last PTA dose 10/5  Dose prior to admission = 2.5 mg TRSun, 5 mg other days  INR therapeutic at 2.05, H/H stable and no bleeding reported.  Goal of Therapy:  INR 2-3 Monitor platelets by anticoagulation protocol: Yes   Plan:  Warfarin 5mg  PO x 1 tonight Daily INR, CBC, s/s bleeding  Bertis Ruddy, PharmD Clinical Pharmacist Please check AMION for all [place] numbers 04/29/2018 9:29 AM   04/29/2018,9:28 AM

## 2018-04-29 NOTE — Progress Notes (Signed)
Pt being discharged from hospital per orders from MD. Pt educated on discharge instructions. Pt verbalized understanding of instructions. All questions and concerns were addressed. Pt's IV was removed prior to discharge. Pt exited hospital via wheelchair accompanied by staff. 

## 2018-04-29 NOTE — Progress Notes (Signed)
Medicine attending: I examined this patient today together with resident physician Dr Harlow Ohms and I concur with her evaluation and management plan which we discussed together. Patient admitted yesterday with a TIA.  Presumed source embolic plaque from disease left carotid artery.  Additional evidence of stenotic posterior cerebral vessels but no evidence for acute infarct on MRI. He remained stable overnight.  No focal neurologic deficits. We will consider adding Pravachol as a alternative lipid lowering drug given previous intolerance to atorvastatin. Echocardiogram done.  Report pending. He is otherwise stable for discharge.  We will arrange follow-up with his cardiologist who has followed him for many years (Dr. Quay Burow).  Of note his baseline LDL is only borderline elevated.  Total cholesterol normal.

## 2018-04-29 NOTE — Progress Notes (Signed)
*  Preliminary Results* Carotid artery duplex has been completed.  Right ICA: 1-39% stenosis Left ICA: 60-79%   Vertebral arteries are patent with antegrade flow.  04/29/2018 12:31 PM  Khadejah Son Dawna Part

## 2018-04-29 NOTE — Care Management CC44 (Signed)
Condition Code 44 Documentation Completed  Patient Details  Name: Curtis Brennan MRN: [id number] Date of Birth: 12-25-1931   Condition Code 44 given:  Yes Patient signature on Condition Code 44 notice:  Yes Documentation of 2 MD's agreement:  Yes Code 44 added to claim:  Yes    Pollie Friar, RN 04/29/2018, 3:43 PM

## 2018-04-29 NOTE — Progress Notes (Addendum)
   Subjective: Curtis Brennan feeling well this morning. He slept well overnight and denied any overnight symptoms of headache, memory loss, confusion, or changes in his vision.  Objective:  Vital signs in last 24 hours: Vitals:   04/29/18 0002 04/29/18 0202 04/29/18 0402 04/29/18 0439  BP: (!) 146/82 140/84 (!) 85/51 (!) 153/76  Pulse: 73 83 64 72  Resp:      Temp:      TempSrc:      SpO2: 95% 100% 95% 97%  Weight:      Height:       General- seen lying in bed in NAD Heart- RRR, no murmurs appreciated  Lungs- CTA bilaterally, normal effort Extremities- no edema Neuro- alert and oriented x3, CN III-X intact, reflexes intact bilaterally, strength 5/5 bilaterally, finger to nose test intact   Assessment/Plan:  Active Problems:   TIA (transient ischemic attack)  Mr. Bouillon is an 84 yo male with a PMHx of HTN, HLD, COPD, prior pulmonary emboli, CAD s/p directional coronary atherectomy, CABG, AVR for AS, chronic anticoagulation on coumadin, asymptomatic moderate left internal carotid artery stenosis and prostate cancer presenting with acute onset of memory loss.   TIA - acute onset of memory loss, resolved in ~10 min; preceding headache today; blurred vision and generalized weakness for a couple months - CT head showed no acute abnormality, chronic microvascular ischemic change, atherosclerosis  - MRA showed severe proximal right PCA and bilateral proximal M2 stenoses; no acute abnormality and moderate chronic small vessel ischemic disease  - Hgb A1c 6.3; 6.2 6 years ago  - HDL 31, LDL 104; intolerant to Lipitor - f/u echo  - No PT follow up recommended; f/u OT/Speech consult  - ASA  - Neuro on board, appreciate recommendations - consulted cardiology with plans to follow up outpatient in the lipid clinic   AKI - Cr on admission 1.67, Cr from 6 years ago 1.06; BUN 26  Hx of CABG 11/2011 CAD - CAD s/p directional coronary atherectomy; ultimately underwent CABG x3 in 2013 with a  LIMA to his LAD and tissue AVR for aortic stenosis - Doppler studies performed 10/17/17 revealing moderately severe left ICA stenosis 60-79%, right ICA 1-39% stenosis; followed annually   - chronic anticoagulation therapy on coumadin; INR 2.05 - cardiac monitoring - cardiology consulted, will help schedule an outpatient f/u for lipid clinic    HLD - Intolerant to statin therapy; severe muscle cramps - last lipid panel from 2017 wnl - lipid panel HDL 31, LDL 104; neuro recommending pravachol  - cards recommended outpatient follow up with lipid clinic   HTN - hold home medications lisinopril and metoprolol; permissive HTN   Hx of prior PE  - on coumadin anticoagulation followed by PCP for the past five years   Dispo: Anticipated discharge approximately today.  Mike Craze, DO 04/29/2018, 7:11 AM Pager: [phone]

## 2018-04-29 NOTE — Progress Notes (Signed)
  Echocardiogram 2D Echocardiogram has been performed.  Zayon Trulson L Androw 04/29/2018, 12:07 PM

## 2018-04-29 NOTE — Discharge Instructions (Signed)
Mr. Koman,  Please continue taking your medications as prescribed. Please make sure to follow up with your PCP and Cardiologist as soon as possible.   Thank you for allowing [place] to be a part of your care!

## 2018-04-29 NOTE — Progress Notes (Signed)
OT Cancellation Note and Discharge  Patient Details Name: Curtis Brennan MRN: [id number] DOB: 04-21-1932   Cancelled Treatment:    Reason Eval/Treat Not Completed: Other (comment). Received text from evaluating PT that pt is back to his baseline without issues with basic ADLs. Eval not completed. [place], OTR/L Acute Rehab Services Pager [phone] Office [phone]     Almon Register 04/29/2018, 9:11 AM

## 2018-04-29 NOTE — Evaluation (Signed)
Physical Therapy Evaluation Patient Details Name: Curtis Brennan MRN: [id number] DOB: August 02, 1931 Today's Date: 04/29/2018   History of Present Illness  Patient is a 84 y/o male who presents with episode of confusion, memory loss, facial droop. Head CT and MRI-unremarkable. COncern for TIA. PMh includes HTN, CAD, PE ( coumadin), aortic stenosis   Clinical Impression  Patient presents at baseline with no symptoms or deficits. Tolerated transfers, gait training and stair training with supervision for safety. Pt with 2/4 DOE, HR up to 111 bpm during session. Pt independent PTA and helps care for wife. Reports no falls. Has a supportive family that lives near by. Pt does not require skilled therapy services as pt functioning at baseline. All education completed. Discharge from therapy.    Follow Up Recommendations No PT follow up;Supervision - Intermittent    Equipment Recommendations  None recommended by PT    Recommendations for Other Services       Precautions / Restrictions Precautions Precautions: None Restrictions Weight Bearing Restrictions: No      Mobility  Bed Mobility Overal bed mobility: Modified Independent                Transfers Overall transfer level: Needs assistance Equipment used: None Transfers: Sit to/from Stand Sit to Stand: Supervision         General transfer comment: Supervision for safety.   Ambulation/Gait Ambulation/Gait assistance: Supervision Gait Distance (Feet): 250 Feet Assistive device: None Gait Pattern/deviations: Step-through pattern;Decreased stride length;Drifts right/left     General Gait Details: Slow, mostly steady gait with mild drifing noted but no overt LOB. 2/4 DOE,. HR up to 111 bpm.  Stairs Stairs: Yes Stairs assistance: Supervision Stair Management: Alternating pattern;One rail Right Number of Stairs: 3(+ 2 steps x2 bouts) General stair comments: Cues for safety.   Wheelchair Mobility    Modified Rankin  (Stroke Patients Only) Modified Rankin (Stroke Patients Only) Pre-Morbid Rankin Score: Slight disability Modified Rankin: Slight disability     Balance Overall balance assessment: Needs assistance Sitting-balance support: Feet supported;No upper extremity supported Sitting balance-Leahy Scale: Good Sitting balance - Comments: Able to reach outside BoS and adjust socks without difficulty.    Standing balance support: During functional activity Standing balance-Leahy Scale: Good                               Pertinent Vitals/Pain Pain Assessment: No/denies pain    Home Living Family/patient expects to be discharged to:: Private residence Living Arrangements: Spouse/significant other Available Help at Discharge: Family;Available 24 hours/day Type of Home: House Home Access: Stairs to enter Entrance Stairs-Rails: Right;Left;Can reach both Entrance Stairs-Number of Steps: 3 Home Layout: One level Home Equipment: Clinical cytogeneticist - 2 wheels;Cane - single point;Wheelchair - manual;Bedside commode      Prior Function Level of Independence: Independent         Comments: Drives. No falls reported. 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 Helps care for wife.     Hand Dominance        Extremity/Trunk Assessment   Upper Extremity Assessment Upper Extremity Assessment: Defer to OT evaluation    Lower Extremity Assessment Lower Extremity Assessment: Overall WFL for tasks assessed       Communication   Communication: No difficulties  Cognition Arousal/Alertness: Awake/alert Behavior During Therapy: WFL for tasks assessed/performed Overall Cognitive Status: Within Functional Limits for tasks assessed  General Comments General comments (skin integrity, edema, etc.): Daughter prsent during session.    Exercises     Assessment/Plan    PT Assessment Patent does not need any further  PT services  PT Problem List         PT Treatment Interventions      PT Goals (Current goals can be found in the Care Plan section)  Acute Rehab PT Goals Patient Stated Goal: to get out of here PT Goal Formulation: All assessment and education complete, DC therapy    Frequency     Barriers to discharge        Co-evaluation               AM-PAC PT "6 Clicks" Daily Activity  Outcome Measure Difficulty turning over in bed (including adjusting bedclothes, sheets and blankets)?: None Difficulty moving from lying on back to sitting on the side of the bed? : A Little Difficulty sitting down on and standing up from a chair with arms (e.g., wheelchair, bedside commode, etc,.)?: None Help needed moving to and from a bed to chair (including a wheelchair)?: None Help needed walking in hospital room?: None Help needed climbing 3-5 steps with a railing? : A Little 6 Click Score: 22    End of Session Equipment Utilized During Treatment: Gait belt Activity Tolerance: Patient tolerated treatment well Patient left: in bed;with call bell/phone within reach;with bed alarm set;with family/visitor present Nurse Communication: Mobility status PT Visit Diagnosis: Difficulty in walking, not elsewhere classified (R26.2)    Time: 8527-7824 PT Time Calculation (min) (ACUTE ONLY): 15 min   Charges:   PT Evaluation $PT Eval Low Complexity: 1 Low          Wray Kearns, PT, DPT Acute Rehabilitation Services Pager [phone] Office [phone]      [place] 04/29/2018, 9:05 AM

## 2018-04-29 NOTE — Care Management Obs Status (Signed)
[place] NOTIFICATION   Patient Details  Name: Curtis Brennan MRN: [id number] Date of Birth: 1931-12-31   Medicare Observation Status Notification Given:  Yes    Pollie Friar, RN 04/29/2018, 3:43 PM

## 2018-04-29 NOTE — Discharge Summary (Signed)
Name: Sundance Moise MRN: [id number] DOB: Jun 25, 1932 84 y.o. PCP: [name], MD  Date of Admission: 04/28/2018  2:05 PM Date of Discharge: 04/29/2018 Attending Physician: Annia Belt, MD  Discharge Diagnosis: 1. TIA  Discharge Medications: Allergies as of 04/29/2018      Reactions   Atorvastatin Other (See Comments)   myalgias   Sulfa Antibiotics Rash   Pt does not recall a rash from any medication   Sulfonamide Derivatives Rash   Pt does not recall a rash from any medication      Medication List    TAKE these medications   amoxicillin 500 MG capsule Commonly known as:  AMOXIL Take 4 tablets one hour prior to dental appointment What changed:    how much to take  how to take this  when to take this  additional instructions   aspirin EC 81 MG tablet Take 81 mg by mouth at bedtime.   lisinopril 20 MG tablet Commonly known as:  PRINIVIL,ZESTRIL TAKE 1 TABLET BY MOUTH DAILY What changed:  when to take this   metoprolol tartrate 25 MG tablet Commonly known as:  LOPRESSOR Take 1.5 tablets (37.5 mg total) by mouth 2 (two) times daily. What changed:  how much to take   NITROSTAT 0.4 MG SL tablet Generic drug:  nitroGLYCERIN TAKE 1 TABLET UNDER THE TONGUE AS NEEDEDFOR CHEST PAIN (MAY REPEAT EVERY 5 MIN X3 DOSES, CALL 911 IF NO RELIEF.) What changed:  See the new instructions.   silver sulfADIAZINE 1 % cream Commonly known as:  SILVADENE Apply 1 application topically daily as needed (wound care).   vitamin B-12 1000 MCG tablet Commonly known as:  CYANOCOBALAMIN Take 1,000 mcg by mouth daily.   warfarin 5 MG tablet Commonly known as:  COUMADIN Take 2.5-5 mg by mouth See admin instructions. Take 1/2 tablet (2.5 mg) by mouth Sunday, Tuesday, Thursday evening, take 1 tablet (5 mg) Monday, Wednesday, Friday, Saturday evening.       Disposition and follow-up:   Mr.Burnham Bolio was discharged from [facility] in Stable condition.   At the hospital follow up visit please address:  1.  Statin therapy- Patient intolerant to statin therapy with lipitor and pravachol; cardiology recommended following up with the lipid clinic for medical management   2.  Labs / imaging needed at time of follow-up: none  3.  Pending labs/ test needing follow-up: echo, vascular US carotid duplex bilateral   Follow-up Appointments: Follow-up Information    [name], MD Follow up.   Specialty:  Family Medicine Contact information: [address] [place] [address] [phone]        [name], MD Follow up.   Specialties:  Cardiology, Radiology Contact information: [address] [place] [address] [place] by problem list: 1. TIA-  Mr. Polidori presented with an acute onset of memory loss,resolved in ~10 min. He had preceding headachetoday;blurred vision and generalized weakness for a couple months. He could not remember the names of any of his family members although he was able to recognize them. CT head showed no acute abnormality, chronic microvascular ischemic change, atherosclerosis. MRA showed severe proximal right PCA and bilateral proximal M2 stenoses; no acute abnormality and moderate chronic small vessel ischemic disease. Hgb A1c 6.3; 6.2 6 years ago. HDL 31, LDL 104; intolerant to Lipitor and pravochol. Neurology recommended considering other statin therapies. Cardiology was consulted and  recommended outpatient follow up with their lipid clinic for medical management possibly with PCSK9 inhibitors. Echo and vascular US of carotids was pending at the time of discharge. Discharged with plans to continue all home medications and recommendation to closely follow up with PCP and cardiology.   Discharge Vitals:   BP (!) 156/79 (BP Location: Left Arm)   Pulse 69   Temp 97.7 F (36.5 C) (Oral)   Resp 18   Ht 5\' 9"  (1.753 m)   Wt 84.5 kg   SpO2 100%   BMI 27.51  kg/m   Pertinent Labs, Studies, and Procedures:   CBC Latest Ref Rng & Units 04/28/2018 04/28/2018 12/25/2011  WBC 4.0 - 10.5 K/uL - 6.8 5.3  Hemoglobin 13.0 - 17.0 g/dL 15.3 15.7 10.1(L)  Hematocrit 39.0 - 52.0 % 45.0 47.1 29.7(L)  Platelets 150 - 400 K/uL - 155 115(L)    CMP Latest Ref Rng & Units 04/28/2018 04/28/2018 09/17/2015  Glucose 70 - 99 mg/dL 104(H) 110(H) -  BUN 8 - 23 mg/dL 30(H) 26(H) -  Creatinine 0.61 - 1.24 mg/dL 1.60(H) 1.67(H) -  Sodium 135 - 145 mmol/L 136 134(L) -  Potassium 3.5 - 5.1 mmol/L 4.5 4.5 -  Chloride 98 - 111 mmol/L 101 101 -  CO2 22 - 32 mmol/L - 24 -  Calcium 8.9 - 10.3 mg/dL - 9.1 -  Total Protein 6.5 - 8.1 g/dL - 6.6 6.2  Total Bilirubin 0.3 - 1.2 mg/dL - 1.5(H) 1.4(H)  Alkaline Phos 38 - 126 U/L - 46 41  AST 15 - 41 U/L - 31 19  ALT 0 - 44 U/L - 22 14   Ct Head Wo Contrast  Result Date: 04/28/2018 CLINICAL DATA:  Acute onset confusion, memory loss and facial droop at 12:30 p.m. today. EXAM: CT HEAD WITHOUT CONTRAST TECHNIQUE: Contiguous axial images were obtained from the base of the skull through the vertex without intravenous contrast. COMPARISON:  Head CT scan 07/09/2009. FINDINGS: Brain: No evidence of acute infarction, hemorrhage, hydrocephalus, extra-axial collection or mass lesion/mass effect. Hypoattenuation in the periventricular and subcortical deep white matter compatible with chronic microvascular ischemic change shows some progression since the prior exam. Vascular: Extensive atherosclerosis noted. Skull: Intact. Sinuses/Orbits: Negative. Other: None. IMPRESSION: No acute abnormality. Chronic microvascular ischemic change. Atherosclerosis. Electronically Signed   By: Inge Rise M.D.   On: 04/28/2018 14:58   Mr Brain Wo Contrast  Result Date: 04/28/2018 CLINICAL DATA:  Acute onset confusion, memory loss and facial droop. Episode lasted 10 minutes. EXAM: MRI HEAD WITHOUT CONTRAST MRA HEAD WITHOUT CONTRAST TECHNIQUE: Multiplanar,  multiecho pulse sequences of the brain and surrounding structures were obtained without intravenous contrast. Angiographic images of the head were obtained using MRA technique without contrast. COMPARISON:  Head CT 04/28/2018 FINDINGS: MRI HEAD FINDINGS Brain: There is no evidence of acute infarct, mass, midline shift, or extra-axial fluid collection. Chronic microhemorrhages are noted in the posterior right frontal lobe and left occipital lobe. Patchy cerebral white matter T2 hyperintensities are nonspecific but compatible with moderate chronic small vessel ischemic disease. There is moderate cerebral atrophy. A chronic lacunar infarct versus dilated perivascular space is noted in the upper right pons. Vascular: Major intracranial vascular flow voids are preserved. Skull and upper cervical spine: Unremarkable bone marrow signal. Sinuses/Orbits: Unremarkable orbits. Clear paranasal sinuses. Small bilateral mastoid effusions. Other: None. MRA HEAD FINDINGS The visualized distal vertebral arteries are patent to the basilar with the left being dominant. PICA origins were not imaged. Patent AICA and  SCA origins are identified bilaterally. The basilar artery is widely patent. Posterior communicating arteries are not identified and may be diminutive or absent. There is a severe stenosis at the right P1-P2 junction. With mild proximal supraclinoid stenosis bilaterally. ACAs and MCAs are patent without evidence of proximal branch occlusion or significant A1 or M1 stenosis. There are severe proximal M2 stenosis bilaterally. No aneurysm is identified. The internal carotid arteries are patent from skull base to carotid termini. IMPRESSION: 1. No acute intracranial abnormality. 2. Moderate chronic small vessel ischemic disease. 3. Patent circle of Willis without major branch occlusion. 4. Severe proximal right PCA and bilateral proximal M2 stenoses. Electronically Signed   By: Logan Bores M.D.   On: 04/28/2018 22:08   Mr Jodene Nam  Head Wo Contrast  Result Date: 04/28/2018 CLINICAL DATA:  Acute onset confusion, memory loss and facial droop. Episode lasted 10 minutes. EXAM: MRI HEAD WITHOUT CONTRAST MRA HEAD WITHOUT CONTRAST TECHNIQUE: Multiplanar, multiecho pulse sequences of the brain and surrounding structures were obtained without intravenous contrast. Angiographic images of the head were obtained using MRA technique without contrast. COMPARISON:  Head CT 04/28/2018 FINDINGS: MRI HEAD FINDINGS Brain: There is no evidence of acute infarct, mass, midline shift, or extra-axial fluid collection. Chronic microhemorrhages are noted in the posterior right frontal lobe and left occipital lobe. Patchy cerebral white matter T2 hyperintensities are nonspecific but compatible with moderate chronic small vessel ischemic disease. There is moderate cerebral atrophy. A chronic lacunar infarct versus dilated perivascular space is noted in the upper right pons. Vascular: Major intracranial vascular flow voids are preserved. Skull and upper cervical spine: Unremarkable bone marrow signal. Sinuses/Orbits: Unremarkable orbits. Clear paranasal sinuses. Small bilateral mastoid effusions. Other: None. MRA HEAD FINDINGS The visualized distal vertebral arteries are patent to the basilar with the left being dominant. PICA origins were not imaged. Patent AICA and SCA origins are identified bilaterally. The basilar artery is widely patent. Posterior communicating arteries are not identified and may be diminutive or absent. There is a severe stenosis at the right P1-P2 junction. With mild proximal supraclinoid stenosis bilaterally. ACAs and MCAs are patent without evidence of proximal branch occlusion or significant A1 or M1 stenosis. There are severe proximal M2 stenosis bilaterally. No aneurysm is identified. The internal carotid arteries are patent from skull base to carotid termini. IMPRESSION: 1. No acute intracranial abnormality. 2. Moderate chronic small vessel  ischemic disease. 3. Patent circle of Willis without major branch occlusion. 4. Severe proximal right PCA and bilateral proximal M2 stenoses. Electronically Signed   By: Logan Bores M.D.   On: 04/28/2018 22:08     Discharge Instructions: Discharge Instructions    Diet - low sodium heart healthy   Complete by:  As directed    Increase activity slowly   Complete by:  As directed       Signed: Taisha Pennebaker, Charlsie Quest, DO 04/29/2018, 2:42 PM   Pager: [phone]

## 2018-04-29 NOTE — Progress Notes (Signed)
SLP Cancellation Note  Patient Details Name: Curtis Brennan MRN: [id number] DOB: [birth year]   Cancelled treatment:       Reason Eval/Treat Not Completed: SLP screened chart, no needs identified, will sign off   Juan Quam Laurice 04/29/2018, 12:41 PM

## 2018-05-01 ENCOUNTER — Other Ambulatory Visit: Payer: Self-pay | Admitting: *Deleted

## 2018-05-01 DIAGNOSIS — I6523 Occlusion and stenosis of bilateral carotid arteries: Secondary | ICD-10-CM

## 2018-05-03 ENCOUNTER — Telehealth: Payer: Self-pay | Admitting: Cardiovascular Disease

## 2018-05-03 NOTE — Telephone Encounter (Signed)
Called to schedule LIPID Clinic appointment that was orordered by Ellen Henri, PA----patient will think about it and call back

## 2018-05-06 NOTE — Progress Notes (Signed)
Subjective:    Patient ID: Curtis Brennan, male    DOB: February 16, 1932, 84 y.o.   MRN: [id number]  HPI:  Curtis Brennan is here to establish as a new pt.  He is a pleasant 84 year old male. PMH: HTN, HLD, CAD, CABG, AVR, Chronic Anticoagulation for pulmonary emboli. 04/28/18- Hospital Admission for TIA "TIA-  Curtis Brennan presented with an acute onset of memory loss,resolved in ~10 min. He had preceding headachetoday;blurred vision and generalized weakness for a couple months. He could not remember the names of any of his family members although he was able to recognize them. CT head showed no acute abnormality, chronic microvascular ischemic change, atherosclerosis. MRA showed severe proximal right PCA and bilateral proximal M2 stenoses; no acute abnormality and moderate chronic small vessel ischemic disease. Hgb A1c 6.3; 6.2 6 years ago. HDL 31, LDL 104; intolerant to Lipitor and pravochol. Neurology recommended considering other statin therapies. Cardiology was consulted and recommended outpatient follow up with their lipid clinic for medical management possibly with PCSK9 inhibitors. Echo and vascular US of carotids was pending at the time of discharge. Discharged with plans to continue all home medications and recommendation to closely follow up with PCP and cardiology." He reports that his INR was being checked every 2-3 months at previous PCP and reports easy bruising. He reports being followed by Dr. Berry/Cardiology annually, last OV 11/30/17 He states "Dr. Kennon Holter nurse called and told me R neck was fine my L neck hasn't changed in years and to see him in a year". He reports that his brother passed away at age 32 from CVA and his mother passed away at age 84 from brain cancer. When he was reaching into bag to produce pill bottles, fine tremor of upper extremities noted, asked when that started he stated "about 6-8 months ago". Reviewed all recent hospital imaging, notes, labs Discussed the  recommendation to be seen by Cardiology, Neurology, Lipid Clinic He declined referral to Neurology at this time   Patient Care Team    Relationship Specialty Notifications Start End  Mina Marble D, NP PCP - General Family Medicine  05/07/18   [name], MD PCP - Cardiology Cardiology Admissions 07/20/17   Grace Isaac, MD Consulting Physician Cardiothoracic Surgery  12/15/11   [name], MD Attending Physician Cardiology  12/15/11     Patient Active Problem List   Diagnosis Date Noted  . Healthcare maintenance 05/07/2018  . S/P AVR (aortic valve replacement)   . Coronary artery disease involving coronary bypass graft of native heart without angina pectoris   . Hx of pulmonary embolus   . Hyperlipidemia   . TIA (transient ischemic attack) 04/28/2018  . Bilateral lower extremity edema 11/30/2017  . PAF s/p CABG/AVR 12/26/2011  . Unstable angina ([place]) 12/26/2011  . COPD by CXR 12/16/2011  . Hx of CABG 12/15/2011  . AS, s/p tissue AVR 12/20/11 12/15/2011  . Chronic anticoagulation, on coumadin for pul emboli 12/15/2011  . Carotid artery disease, bilateral, asymptomatic 12/15/2011  . Dyslipidemia 10/28/2008  . Benign essential HTN 10/28/2008  . History of prior pulm embolism 10/28/2008  . PULMONARY HYPERTENSION 10/28/2008     Past Medical History:  Diagnosis Date  . Angina   . AS (aortic stenosis) moderate to severe by cath 12/14/11 for AVR  12/15/2011  . CAD (coronary artery disease) with history of LAD & Diag athrectomy in 1994, now with severe 3 vesseal CAD by cath 12/14/11 for CABG 12/15/2011  . Carotid  artery disease, bilateral 12/15/2011  . Chronic anticoagulation, on coumadin for pul emboli 12/15/2011  . Complication of anesthesia 07/1997   "just wouldn't wake wakeup"  . Coronary artery disease   . Exertional dyspnea   . Heart murmur   . Hypertension   . Kidney stones   . Legionnaire's disease ([place]) 1986  . Pneumonia    "once"  . PONV (postoperative  nausea and vomiting)   . Prostate cancer ([place])   . Pulmonary embolism ([place]) ~ 2011   "both lungs"  . Unstable angina ([place]) 12/15/2011     Past Surgical History:  Procedure Laterality Date  . AORTIC VALVE REPLACEMENT  12/20/2011   Procedure: AORTIC VALVE REPLACEMENT (AVR);  Surgeon: Grace Isaac, MD;  Location: [facility];  Service: Open Heart Surgery;  Laterality: N/A;  . CARDIAC CATHETERIZATION  12/14/11  . CHOLECYSTECTOMY  2001  . CORONARY ANGIOPLASTY  1994   "roto rooter"  . CORONARY ARTERY BYPASS GRAFT  12/20/2011   Procedure: CORONARY ARTERY BYPASS GRAFTING (CABG);  Surgeon: Grace Isaac, MD;  Location: [facility] [place];  Service: Open Heart Surgery;  Laterality: N/A;  . CYSTOSCOPY  12/20/2011   Procedure: CYSTOSCOPY;  Surgeon: Fredricka Bonine, MD;  Location: [facility];  Service: Urology;  Laterality: N/A;  . gallstone removed  ~ 2010   "was lodged below pancreatis"  . LEFT HEART CATHETERIZATION WITH CORONARY ANGIOGRAM N/A 12/14/2011   Procedure: LEFT HEART CATHETERIZATION WITH CORONARY ANGIOGRAM;  Surgeon: [name], MD;  Location: [facility] CATH LAB;  Service: Cardiovascular;  Laterality: N/A;  . LITHOTRIPSY  2006  . MASTOIDECTOMY Left   . PROSTATE SURGERY  07/1997  . RIGHT HEART CATHETERIZATION  12/14/2011   Procedure: RIGHT HEART CATH;  Surgeon: [name], MD;  Location: [facility] CATH LAB;  Service: Cardiovascular;;  . TYMPANIC MEMBRANE REPAIR Right      Family History  Problem Relation Age of Onset  . Cancer Mother        brain  . Cancer Sister        breast and pancreatic  . Alcohol abuse Brother   . Hypertension Brother   . Stroke Brother      Social History   Substance and Sexual Activity  Drug Use No     Social History   Substance and Sexual Activity  Alcohol Use No     Social History   Tobacco Use  Smoking Status Never Smoker  Smokeless Tobacco Never Used     Outpatient Encounter Medications as of 05/07/2018  Medication Sig  . aspirin EC 81  MG tablet Take 81 mg by mouth at bedtime.  [name] lisinopril (PRINIVIL,ZESTRIL) 20 MG tablet TAKE 1 TABLET BY MOUTH DAILY (Patient taking differently: Take 20 mg by mouth at bedtime. )  . metoprolol tartrate (LOPRESSOR) 25 MG tablet Take 1.5 tablets (37.5 mg total) by mouth 2 (two) times daily. (Patient taking differently: Take 25 mg by mouth 2 (two) times daily. )  . vitamin B-12 (CYANOCOBALAMIN) 1000 MCG tablet Take 1,000 mcg by mouth daily.  [name] warfarin (COUMADIN) 5 MG tablet Take 2.5-5 mg by mouth See admin instructions. Take 1/2 tablet (2.5 mg) by mouth Sunday, Tuesday, Thursday evening, take 1 tablet (5 mg) Monday, Wednesday, Friday, Saturday evening.  . [DISCONTINUED] amoxicillin (AMOXIL) 500 MG capsule Take 4 tablets one hour prior to dental appointment (Patient taking differently: Take 2,000 mg by mouth See admin instructions. Take 4 capsules (2,000 mg) by mouth one hour prior to dental appointments.)  . [  DISCONTINUED] NITROSTAT 0.4 MG SL tablet TAKE 1 TABLET UNDER THE TONGUE AS NEEDEDFOR CHEST PAIN (MAY REPEAT EVERY 5 MIN X3 DOSES, CALL 911 IF NO RELIEF.) (Patient taking differently: Place 0.4 mg under the tongue every 5 (five) minutes as needed for chest pain. )  . [DISCONTINUED] silver sulfADIAZINE (SILVADENE) 1 % cream Apply 1 application topically daily as needed (wound care).   No facility-administered encounter medications on file as of 05/07/2018.     Allergies: Atorvastatin; Sulfa antibiotics; and Sulfonamide derivatives  Body mass index is 28.34 kg/m.  Blood pressure (!) 160/75, pulse 66, height 5\' 9"  (1.753 m), weight 191 lb 14.4 oz (87 kg), SpO2 100 %.  Review of Systems  Constitutional: Positive for fatigue. Negative for activity change, appetite change, chills, diaphoresis, fever and unexpected weight change.  Eyes: Negative for visual disturbance.  Respiratory: Negative for cough, chest tightness, shortness of breath, wheezing and stridor.   Cardiovascular: Negative for  chest pain, palpitations and leg swelling.  Gastrointestinal: Negative for abdominal distention, abdominal pain, blood in stool, constipation, diarrhea, nausea and vomiting.  Endocrine: Negative for cold intolerance, heat intolerance, polydipsia, polyphagia and polyuria.  Genitourinary: Positive for urgency. Negative for difficulty urinating and flank pain.       Nocturia   Neurological: Positive for tremors. Negative for dizziness and headaches.  Hematological: Bruises/bleeds easily.  Psychiatric/Behavioral: Negative for hallucinations, self-injury, sleep disturbance and suicidal ideas. The patient is not nervous/anxious and is not hyperactive.        Objective:   Physical Exam  Constitutional: He is oriented to person, place, and time. He appears well-developed and well-nourished. No distress.  HENT:  Head: Normocephalic and atraumatic.  Right Ear: External ear normal.  Left Ear: External ear normal.  Nose: Nose normal.  Mouth/Throat: Oropharynx is clear and moist.  Cardiovascular: Normal rate, regular rhythm and intact distal pulses.  Murmur heard. Pulmonary/Chest: Effort normal and breath sounds normal. No stridor. No respiratory distress. He has no wheezes. He has no rales. He exhibits no tenderness.  Neurological: He is alert and oriented to person, place, and time. He displays tremor.  Skin: Skin is warm and dry. Capillary refill takes less than 2 seconds. No rash noted. He is not diaphoretic. No erythema. No pallor.  Psychiatric: He has a normal mood and affect. His speech is normal and behavior is normal. Judgment and thought content normal. He exhibits abnormal recent memory.  Nursing note reviewed.     Assessment & Plan:   1. Healthcare maintenance   2. S/P AVR (aortic valve replacement)   3. Benign essential HTN   4. Bilateral carotid artery stenosis   5. TIA (transient ischemic attack)   6. Dyslipidemia   7. Hx of pulmonary embolus     Benign essential HTN BP  above goal 160/75, HR 66 Currently on Lisino[ril 20mg  QD , Metoprolol 25mg  BID instead of ordered 37.5mg  BID Followed by Cards/Dr. Gwenlyn Found  Carotid artery disease, bilateral, asymptomatic Followed by Cards/Dr. Gwenlyn Found Has not needed Nitro in years  TIA (transient ischemic attack) Hospitalized 04/28/18 CT Head WO- No acute abnormality MR Brain WO- No acute intracranial abnormality Declined referral to Neurology  Healthcare maintenance Continue all medications as directed. Please keep your appt with Lipid Clinic this Thursday May 09, 2018. Referral placed to Coumadin Clinic- to check monthly INR and manage Coumadin dosage. We do not have INR machine in our office. Please follow-up with Dr. Market researcher. Strongly recommend that you be seen by Neurology, re: recent hospitalization for  TIA and family history of stroke, brain cancer, and new onset of upper extremity tremor. We can further discuss this at your follow-up in 6 weeks.  Dyslipidemia Statin intolerant Lipid Clinic appt 05/09/18  Hx of pulmonary embolus Warfarin therapy Previous PCP managed  We cannot check INR in our clinic- referred to Coumadin Clinic for monthly INR levels and Warfarin management  Pt was in the office today for 45+ minutes, I spent >75%  Of time  in face to face counseling of various medical concerns and in coordination of care   FOLLOW-UP:  Return in about 6 weeks (around 06/18/2018) for Regular Follow Up, HTN, Hypercholestermia.

## 2018-05-07 ENCOUNTER — Encounter: Payer: Self-pay | Admitting: Adult Health

## 2018-05-07 ENCOUNTER — Ambulatory Visit (INDEPENDENT_AMBULATORY_CARE_PROVIDER_SITE_OTHER): Payer: Medicare Other | Admitting: Adult Health

## 2018-05-07 VITALS — BP 160/75 | HR 66 | Ht 69.0 in | Wt 191.9 lb

## 2018-05-07 DIAGNOSIS — Z86711 Personal history of pulmonary embolism: Secondary | ICD-10-CM

## 2018-05-07 DIAGNOSIS — Z0000 Encounter for general adult medical examination without abnormal findings: Secondary | ICD-10-CM | POA: Diagnosis not present

## 2018-05-07 DIAGNOSIS — Z952 Presence of prosthetic heart valve: Secondary | ICD-10-CM | POA: Diagnosis not present

## 2018-05-07 DIAGNOSIS — I6523 Occlusion and stenosis of bilateral carotid arteries: Secondary | ICD-10-CM | POA: Diagnosis not present

## 2018-05-07 DIAGNOSIS — E785 Hyperlipidemia, unspecified: Secondary | ICD-10-CM

## 2018-05-07 DIAGNOSIS — G459 Transient cerebral ischemic attack, unspecified: Secondary | ICD-10-CM

## 2018-05-07 DIAGNOSIS — I10 Essential (primary) hypertension: Secondary | ICD-10-CM | POA: Diagnosis not present

## 2018-05-07 NOTE — Assessment & Plan Note (Signed)
Warfarin therapy Previous PCP managed  We cannot check INR in our clinic- referred to Coumadin Clinic for monthly INR levels and Warfarin management

## 2018-05-07 NOTE — Assessment & Plan Note (Signed)
Followed by Cards/Dr. Gwenlyn Found Has not needed Nitro in years

## 2018-05-07 NOTE — Assessment & Plan Note (Signed)
Continue all medications as directed. Please keep your appt with Lipid Clinic this Thursday May 09, 2018. Referral placed to Coumadin Clinic- to check monthly INR and manage Coumadin dosage. We do not have INR machine in our office. Please follow-up with Dr. Market researcher. Strongly recommend that you be seen by Neurology, re: recent hospitalization for TIA and family history of stroke, brain cancer, and new onset of upper extremity tremor. We can further discuss this at your follow-up in 6 weeks.

## 2018-05-07 NOTE — Assessment & Plan Note (Signed)
Hospitalized 04/28/18 CT Head WO- No acute abnormality MR Brain WO- No acute intracranial abnormality Declined referral to Neurology

## 2018-05-07 NOTE — Assessment & Plan Note (Signed)
Statin intolerant Lipid Clinic appt 05/09/18

## 2018-05-07 NOTE — Patient Instructions (Addendum)
Transient Ischemic Attack A transient ischemic attack (TIA) is a "warning stroke" that causes stroke-like symptoms. A TIA does not cause lasting damage to the brain. The symptoms of a TIA can happen fast and do not last long. It is important to know the symptoms of a TIA and what to do. This can help prevent stroke or death. Follow these instructions at home:  Take medicines only as told by your doctor. Make sure you understand all of the instructions.  You may need to take aspirin or warfarin medicine. Warfarin needs to be taken exactly as told. ? Taking too much or too little warfarin is dangerous. Blood tests must be done as often as told by your doctor. A PT blood test measures how long it takes for blood to clot. Your PT is used to calculate another value called an INR. Your PT and INR help your doctor adjust your warfarin dosage. He or she will make sure you are taking the right amount. ? Food can cause problems with warfarin and affect the results of your blood tests. This is true for foods high in vitamin K. Eat the same amount of foods high in vitamin K each day. Foods high in vitamin K include spinach, kale, broccoli, cabbage, collard and turnip greens, Brussels sprouts, peas, cauliflower, seaweed, and parsley. Other foods high in vitamin K include beef and pork liver, green tea, and soybean oil. Eat the same amount of foods high in vitamin K each day. Avoid big changes in your diet. Tell your doctor before changing your diet. Talk to a food specialist (dietitian) if you have questions. ? Many medicines can cause problems with warfarin and affect your PT and INR. Tell your doctor about all medicines you take. This includes vitamins and dietary pills (supplements). Do not take or stop taking any prescribed or over-the-counter medicines unless your doctor tells you to. ? Warfarin can cause more bruising or bleeding. Hold pressure over any cuts for longer than normal. Talk to your doctor about other  side effects of warfarin. ? Avoid sports or activities that may cause injury or bleeding. ? Be careful when you shave, floss, or use sharp objects. ? Avoid or drink very little alcohol while taking warfarin. Tell your doctor if you change how much alcohol you drink. ? Tell your dentist and other doctors that you take warfarin before any procedures.  Follow your diet program as told, if you are given one.  Keep a healthy weight.  Stay active. Try to get at least 30 minutes of activity on all or most days.  Do not use any tobacco products, including cigarettes, chewing tobacco, or electronic cigarettes. If you need help quitting, ask your doctor.  Limit alcohol intake to no more than 1 drink per day for nonpregnant women and 2 drinks per day for men. One drink equals 12 ounces of beer, 5 ounces of wine, or 1 ounces of hard liquor.  Do not abuse drugs.  Keep your home safe so you do not fall. You can do this by: ? Putting grab bars in the bedroom and bathroom. ? Raising toilet seats. ? Putting a seat in the shower.  Keep all follow-up visits as told by your doctor. This is important. Contact a doctor if:  Your personality changes.  You have trouble swallowing.  You have double vision.  You are dizzy.  You have a fever. Get help right away if: These symptoms may be an emergency. Do not wait to see if  the symptoms will go away. Get medical help right away. Call your local emergency services (911 in the [place] Do not drive yourself to the hospital.  You have sudden weakness or lose feeling (go numb), especially on one side of the body. This can affect your: ? Face. ? Arm. ? Leg.  You have sudden trouble walking.  You have sudden trouble moving your arms or legs.  You have sudden confusion.  You have trouble talking.  You have trouble understanding.  You have sudden trouble seeing in one or both eyes.  You lose your balance.  Your movements are not smooth.  You  have a sudden, very bad headache with no known cause.  You have new chest pain.  Your heartbeat is unsteady.  You are partly or totally unaware of what is going on around you.  This information is not intended to replace advice given to you by your health care provider. Make sure you discuss any questions you have with your health care provider. Document Released: 04/18/2008 Document Revised: 03/13/2016 Document Reviewed: 10/15/2013 Elsevier Interactive Patient Education  2018 [employer]. Hypertension Hypertension, commonly called high blood pressure, is when the force of blood pumping through the arteries is too strong. The arteries are the blood vessels that carry blood from the heart throughout the body. Hypertension forces the heart to work harder to pump blood and may cause arteries to become narrow or stiff. Having untreated or uncontrolled hypertension can cause heart attacks, strokes, kidney disease, and other problems. A blood pressure reading consists of a higher number over a lower number. Ideally, your blood pressure should be below 120/80. The first ("top") number is called the systolic pressure. It is a measure of the pressure in your arteries as your heart beats. The second ("bottom") number is called the diastolic pressure. It is a measure of the pressure in your arteries as the heart relaxes. What are the causes? The cause of this condition is not known. What increases the risk? Some risk factors for high blood pressure are under your control. Others are not. Factors you can change  Smoking.  Having type 2 diabetes mellitus, high cholesterol, or both.  Not getting enough exercise or physical activity.  Being overweight.  Having too much fat, sugar, calories, or salt (sodium) in your diet.  Drinking too much alcohol. Factors that are difficult or impossible to change  Having chronic kidney disease.  Having a family history of high blood pressure.  Age. Risk  increases with age.  Race. You may be at higher risk if you are African-American.  Gender. Men are at higher risk than women before age 70. After age 44, women are at higher risk than men.  Having obstructive sleep apnea.  Stress. What are the signs or symptoms? Extremely high blood pressure (hypertensive crisis) may cause:  Headache.  Anxiety.  Shortness of breath.  Nosebleed.  Nausea and vomiting.  Severe chest pain.  Jerky movements you cannot control (seizures).  How is this diagnosed? This condition is diagnosed by measuring your blood pressure while you are seated, with your arm resting on a surface. The cuff of the blood pressure monitor will be placed directly against the skin of your upper arm at the level of your heart. It should be measured at least twice using the same arm. Certain conditions can cause a difference in blood pressure between your right and left arms. Certain factors can cause blood pressure readings to be lower or higher than normal (  elevated) for a short period of time:  When your blood pressure is higher when you are in a health care provider's office than when you are at home, this is called white coat hypertension. Most people with this condition do not need medicines.  When your blood pressure is higher at home than when you are in a health care provider's office, this is called masked hypertension. Most people with this condition may need medicines to control blood pressure.  If you have a high blood pressure reading during one visit or you have normal blood pressure with other risk factors:  You may be asked to return on a different day to have your blood pressure checked again.  You may be asked to monitor your blood pressure at home for 1 week or longer.  If you are diagnosed with hypertension, you may have other blood or imaging tests to help your health care provider understand your overall risk for other conditions. How is this  treated? This condition is treated by making healthy lifestyle changes, such as eating healthy foods, exercising more, and reducing your alcohol intake. Your health care provider may prescribe medicine if lifestyle changes are not enough to get your blood pressure under control, and if:  Your systolic blood pressure is above 130.  Your diastolic blood pressure is above 80.  Your personal target blood pressure may vary depending on your medical conditions, your age, and other factors. Follow these instructions at home: Eating and drinking  Eat a diet that is high in fiber and potassium, and low in sodium, added sugar, and fat. An example eating plan is called the DASH (Dietary Approaches to Stop Hypertension) diet. To eat this way: ? Eat plenty of fresh fruits and vegetables. Try to fill half of your plate at each meal with fruits and vegetables. ? Eat whole grains, such as whole wheat pasta, brown rice, or whole grain bread. Fill about one quarter of your plate with whole grains. ? Eat or drink low-fat dairy products, such as skim milk or low-fat yogurt. ? Avoid fatty cuts of meat, processed or cured meats, and poultry with skin. Fill about one quarter of your plate with lean proteins, such as fish, chicken without skin, beans, eggs, and tofu. ? Avoid premade and processed foods. These tend to be higher in sodium, added sugar, and fat.  Reduce your daily sodium intake. Most people with hypertension should eat less than 1,500 mg of sodium a day.  Limit alcohol intake to no more than 1 drink a day for nonpregnant women and 2 drinks a day for men. One drink equals 12 oz of beer, 5 oz of wine, or 1 oz of hard liquor. Lifestyle  Work with your health care provider to maintain a healthy body weight or to lose weight. Ask what an ideal weight is for you.  Get at least 30 minutes of exercise that causes your heart to beat faster (aerobic exercise) most days of the week. Activities may include  walking, swimming, or biking.  Include exercise to strengthen your muscles (resistance exercise), such as pilates or lifting weights, as part of your weekly exercise routine. Try to do these types of exercises for 30 minutes at least 3 days a week.  Do not use any products that contain nicotine or tobacco, such as cigarettes and e-cigarettes. If you need help quitting, ask your health care provider.  Monitor your blood pressure at home as told by your health care provider.  Keep all  follow-up visits as told by your health care provider. This is important. Medicines  Take over-the-counter and prescription medicines only as told by your health care provider. Follow directions carefully. Blood pressure medicines must be taken as prescribed.  Do not skip doses of blood pressure medicine. Doing this puts you at risk for problems and can make the medicine less effective.  Ask your health care provider about side effects or reactions to medicines that you should watch for. Contact a health care provider if:  You think you are having a reaction to a medicine you are taking.  You have headaches that keep coming back (recurring).  You feel dizzy.  You have swelling in your ankles.  You have trouble with your vision. Get help right away if:  You develop a severe headache or confusion.  You have unusual weakness or numbness.  You feel faint.  You have severe pain in your chest or abdomen.  You vomit repeatedly.  You have trouble breathing. Summary  Hypertension is when the force of blood pumping through your arteries is too strong. If this condition is not controlled, it may put you at risk for serious complications.  Your personal target blood pressure may vary depending on your medical conditions, your age, and other factors. For most people, a normal blood pressure is less than 120/80.  Hypertension is treated with lifestyle changes, medicines, or a combination of both. Lifestyle  changes include weight loss, eating a healthy, low-sodium diet, exercising more, and limiting alcohol. This information is not intended to replace advice given to you by your health care provider. Make sure you discuss any questions you have with your health care provider. Document Released: 07/10/2005 Document Revised: 06/07/2016 Document Reviewed: 06/07/2016 Elsevier Interactive Patient Education  2018 [place] all medications as directed. Please keep your appt with Lipid Clinic this Thursday May 09, 2018. Referral placed to Coumadin Clinic- to check monthly INR and manage Coumadin dosage. We do not have INR machine in our office. Please follow-up with Dr. Market researcher. Strongly recommend that you be seen by Neurology, re: recent hospitalization for TIA and family history of stroke, brain cancer, and new onset of upper extremity tremor. We can further discuss this at your follow-up in 6 weeks. WELCOME TO THE PRACTICE!

## 2018-05-07 NOTE — Assessment & Plan Note (Signed)
BP above goal 160/75, HR 66 Currently on Lisino[ril 20mg  QD , Metoprolol 25mg  BID instead of ordered 37.5mg  BID Followed by Cards/Dr. Gwenlyn Found

## 2018-05-08 LAB — TSH: TSH: 2.26 u[IU]/mL (ref 0.450–4.500)

## 2018-05-09 ENCOUNTER — Ambulatory Visit (INDEPENDENT_AMBULATORY_CARE_PROVIDER_SITE_OTHER): Payer: Medicare Other | Admitting: Pharmacist

## 2018-05-09 DIAGNOSIS — Z7901 Long term (current) use of anticoagulants: Secondary | ICD-10-CM | POA: Diagnosis not present

## 2018-05-09 DIAGNOSIS — Z86711 Personal history of pulmonary embolism: Secondary | ICD-10-CM

## 2018-05-09 DIAGNOSIS — E785 Hyperlipidemia, unspecified: Secondary | ICD-10-CM

## 2018-05-09 DIAGNOSIS — Z952 Presence of prosthetic heart valve: Secondary | ICD-10-CM

## 2018-05-09 LAB — POCT INR: INR: 1.5 — AB (ref 2.0–3.0)

## 2018-05-09 MED ORDER — METOPROLOL TARTRATE 25 MG PO TABS: 25.0000 mg | ORAL_TABLET | Freq: Two times a day (BID) | ORAL | 11 refills | Status: DC

## 2018-05-09 NOTE — Progress Notes (Signed)
Patient ID: Curtis Brennan                 DOB: Dec 29, 1931                    MRN: [id number]     HPI: Curtis Brennan is a 84 y.o. male patient of DR Gwenlyn Found referred to lipid clinic by Manus Rudd NP. PMH is significant for hypertension, hyperlipidemia, CAD s/p CABG, TIA, and chronic anticoagulation for hx of PE. Noted history of statin intolerance per MD assessment and previous recommendation for PCSK9i by cardiology and neurology. Patient presents today for lioid management but seems confused about reason for visit. He stated he is "NOT going to try any new medication for hi cholesterol" and he is here for" INR check ONLY"  Current Medications: none  Intolerances:  Atorvastatin 40mg  daily - muscle pain/severe pain in legs    LDL goal: 70mg /dL  Family History: hypertension and stroke in brother; cancer in mother and sister  Social History:  Denies alcohol and tobacco use  Labs: 04/29/2018: CHO 152; TG 91; HDL 31; LDL-c 104   Past Medical History:  Diagnosis Date  . Angina   . AS (aortic stenosis) moderate to severe by cath 12/14/11 for AVR  12/15/2011  . CAD (coronary artery disease) with history of LAD & Diag athrectomy in 1994, now with severe 3 vesseal CAD by cath 12/14/11 for CABG 12/15/2011  . Carotid artery disease, bilateral 12/15/2011  . Chronic anticoagulation, on coumadin for pul emboli 12/15/2011  . Complication of anesthesia 07/1997   "just wouldn't wake wakeup"  . Coronary artery disease   . Exertional dyspnea   . Heart murmur   . Hypertension   . Kidney stones   . Legionnaire's disease ([address]) 1986  . Pneumonia    "once"  . PONV (postoperative nausea and vomiting)   . Prostate cancer ([place])   . Pulmonary embolism ([place]) ~ 2011   "both lungs"  . Unstable angina ([place]) 12/15/2011    Current Outpatient Medications on File Prior to Visit  Medication Sig Dispense Refill  . aspirin EC 81 MG tablet Take 81 mg by mouth at bedtime.    [name] lisinopril (PRINIVIL,ZESTRIL) 20 MG tablet  TAKE 1 TABLET BY MOUTH DAILY (Patient taking differently: Take 20 mg by mouth at bedtime. ) 90 tablet 3  . vitamin B-12 (CYANOCOBALAMIN) 1000 MCG tablet Take 1,000 mcg by mouth daily.    [name] warfarin (COUMADIN) 5 MG tablet Take 2.5-5 mg by mouth See admin instructions. Take 1/2 tablet (2.5 mg) by mouth Sunday, Tuesday, Thursday evening, take 1 tablet (5 mg) Monday, Wednesday, Friday, Saturday evening.     No current facility-administered medications on file prior to visit.     Allergies  Allergen Reactions  . Atorvastatin Other (See Comments)    myalgias  . Sulfa Antibiotics Rash    Pt does not recall a rash from any medication  . Sulfonamide Derivatives Rash    Pt does not recall a rash from any medication    Dyslipidemia LDL well above goal for secondary prevention but patient refuses ALL cholesterol medication. Is NOT willing to discuss PCSK9i or any other available therapy. PCP to follow up as needed.     Kista Robb Rodriguez-Guzman PharmD, BCPS, [place] [id number] 05/13/2018 8:58 AM

## 2018-05-13 ENCOUNTER — Encounter: Payer: Self-pay | Admitting: Pharmacist

## 2018-05-13 NOTE — Assessment & Plan Note (Addendum)
LDL well above goal for secondary prevention but patient refuses ALL cholesterol medication. Is NOT willing to discuss PCSK9i or any other available therapy. PCP to follow up as needed.

## 2018-06-21 NOTE — Progress Notes (Signed)
Subjective:    Patient ID: Curtis Brennan, male    DOB: 09-09-1931, 84 y.o.   MRN: [id number]  HPI: 05/07/18 OV:  Curtis Brennan is here to establish as a new pt.  He is a pleasant 84 year old male. PMH: HTN, HLD, CAD, CABG, AVR, Chronic Anticoagulation for pulmonary emboli. 04/28/18- Hospital Admission for TIA "TIA-  Curtis Brennan presented with an acute onset of memory loss,resolved in ~10 min. He had preceding headachetoday;blurred vision and generalized weakness for a couple months. He could not remember the names of any of his family members although he was able to recognize them. CT head showed no acute abnormality, chronic microvascular ischemic change, atherosclerosis. MRA showed severe proximal right PCA and bilateral proximal M2 stenoses; no acute abnormality and moderate chronic small vessel ischemic disease. Hgb A1c 6.3; 6.2 6 years ago. HDL 31, LDL 104; intolerant to Lipitor and pravochol. Neurology recommended considering other statin therapies. Cardiology was consulted and recommended outpatient follow up with their lipid clinic for medical management possibly with PCSK9 inhibitors. Echo and vascular US of carotids was pending at the time of discharge. Discharged with plans to continue all home medications and recommendation to closely follow up with PCP and cardiology." He reports that his INR was being checked every 2-3 months at previous PCP and reports easy bruising. He reports being followed by Dr. Berry/Cardiology annually, last OV 11/30/17 He states "Dr. Kennon Holter nurse called and told me R neck was fine my L neck hasn't changed in years and to see him in a year". He reports that his brother passed away at age 37 from CVA and his mother passed away at age 32 from brain cancer. When he was reaching into bag to produce pill bottles, fine tremor of upper extremities noted, asked when that started he stated "about 6-8 months ago". Reviewed all recent hospital imaging, notes, labs Discussed  the recommendation to be seen by Cardiology, Neurology, Lipid Clinic He declined referral to Neurology at this time   06/25/18 OV: Curtis Brennan is here for f/U; HTN, HLD, CAD He was seen by Coumadin clinic 05/09/18, last INR 1.5 He denies any unusual bleeding/bruising. He is aware to avoid foods high in Vit k. He reports experiencing dizziness, especially with position changes the last 6 weeks. He reports nearly falling when standing up to quickly from his "arm chair" a few weeks ago. Today both BP checks were quite low in comparison to his baseline- 105/64. HR 78, 106/61, HR 90 He is currently on Lisinopril 20mg  QD and Metoprolol 25mg  BID He estimates to drink >40 oz water/day, eats a "pretty well" and "gets things done around the house". His again declined referral to Neurology Last seen by Dr. Melynda Ripple May 2019, he has not updated them on his low BP or dizziness  Wife at [employer] during [place]    Patient Care Team    Relationship Specialty Notifications Start End  Mina Marble D, NP PCP - General Family Medicine  05/07/18   [name], MD PCP - Cardiology Cardiology Admissions 07/20/17   Grace Isaac, MD Consulting Physician Cardiothoracic Surgery  12/15/11   [name], MD Attending Physician Cardiology  12/15/11     Patient Active Problem List   Diagnosis Date Noted  . Hypertension 06/25/2018  . Long term (current) use of anticoagulants 05/09/2018  . Healthcare maintenance 05/07/2018  . S/P AVR (aortic valve replacement)   . Coronary artery disease involving coronary bypass graft of native heart without  angina pectoris   . Hx of pulmonary embolus   . Hyperlipidemia   . TIA (transient ischemic attack) 04/28/2018  . Bilateral lower extremity edema 11/30/2017  . PAF s/p CABG/AVR 12/26/2011  . Unstable angina ([place]) 12/26/2011  . COPD by CXR 12/16/2011  . Hx of CABG 12/15/2011  . AS, s/p tissue AVR 12/20/11 12/15/2011  . Chronic anticoagulation, on coumadin for pul  emboli 12/15/2011  . Carotid artery disease, bilateral, asymptomatic 12/15/2011  . Dyslipidemia 10/28/2008  . Benign essential HTN 10/28/2008  . History of prior pulm embolism 10/28/2008  . PULMONARY HYPERTENSION 10/28/2008     Past Medical History:  Diagnosis Date  . Angina   . AS (aortic stenosis) moderate to severe by cath 12/14/11 for AVR  12/15/2011  . CAD (coronary artery disease) with history of LAD & Diag athrectomy in 1994, now with severe 3 vesseal CAD by cath 12/14/11 for CABG 12/15/2011  . Carotid artery disease, bilateral 12/15/2011  . Chronic anticoagulation, on coumadin for pul emboli 12/15/2011  . Complication of anesthesia 07/1997   "just wouldn't wake wakeup"  . Coronary artery disease   . Exertional dyspnea   . Heart murmur   . Hypertension   . Kidney stones   . Legionnaire's disease ([place]) 1986  . Pneumonia    "once"  . PONV (postoperative nausea and vomiting)   . Prostate cancer ([place])   . Pulmonary embolism ([place]) ~ 2011   "both lungs"  . Unstable angina ([place]) 12/15/2011     Past Surgical History:  Procedure Laterality Date  . AORTIC VALVE REPLACEMENT  12/20/2011   Procedure: AORTIC VALVE REPLACEMENT (AVR);  Surgeon: Grace Isaac, MD;  Location: [facility];  Service: Open Heart Surgery;  Laterality: N/A;  . CARDIAC CATHETERIZATION  12/14/11  . CHOLECYSTECTOMY  2001  . CORONARY ANGIOPLASTY  1994   "roto rooter"  . CORONARY ARTERY BYPASS GRAFT  12/20/2011   Procedure: CORONARY ARTERY BYPASS GRAFTING (CABG);  Surgeon: Grace Isaac, MD;  Location: [facility];  Service: Open Heart Surgery;  Laterality: N/A;  . CYSTOSCOPY  12/20/2011   Procedure: CYSTOSCOPY;  Surgeon: Fredricka Bonine, MD;  Location: [facility];  Service: Urology;  Laterality: N/A;  . gallstone removed  ~ 2010   "was lodged below pancreatis"  . LEFT HEART CATHETERIZATION WITH CORONARY ANGIOGRAM N/A 12/14/2011   Procedure: LEFT HEART CATHETERIZATION WITH CORONARY ANGIOGRAM;  Surgeon: [name], MD;  Location: [facility] CATH LAB;  Service: Cardiovascular;  Laterality: N/A;  . LITHOTRIPSY  2006  . MASTOIDECTOMY Left   . PROSTATE SURGERY  07/1997  . RIGHT HEART CATHETERIZATION  12/14/2011   Procedure: RIGHT HEART CATH;  Surgeon: [name], MD;  Location: [facility] CATH LAB;  Service: Cardiovascular;;  . TYMPANIC MEMBRANE REPAIR Right      Family History  Problem Relation Age of Onset  . Cancer Mother        brain  . Cancer Sister        breast and pancreatic  . Alcohol abuse Brother   . Hypertension Brother   . Stroke Brother      Social History   Substance and Sexual Activity  Drug Use No     Social History   Substance and Sexual Activity  Alcohol Use No     Social History   Tobacco Use  Smoking Status Never Smoker  Smokeless Tobacco Never Used     Outpatient Encounter Medications as of 06/25/2018  Medication Sig  .  aspirin EC 81 MG tablet Take 81 mg by mouth at bedtime.  [name] lisinopril (PRINIVIL,ZESTRIL) 10 MG tablet Take 1 tablet (10 mg total) by mouth daily.  . metoprolol tartrate (LOPRESSOR) 25 MG tablet Take 1 tablet (25 mg total) by mouth 2 (two) times daily.  . vitamin B-12 (CYANOCOBALAMIN) 1000 MCG tablet Take 1,000 mcg by mouth daily.  [name] warfarin (COUMADIN) 5 MG tablet Take 2.5-5 mg by mouth See admin instructions. Take 1/2 tablet (2.5 mg) by mouth Sunday, Tuesday, Thursday evening, take 1 tablet (5 mg) Monday, Wednesday, Friday, Saturday evening.  . [DISCONTINUED] lisinopril (PRINIVIL,ZESTRIL) 20 MG tablet TAKE 1 TABLET BY MOUTH DAILY (Patient taking differently: Take 20 mg by mouth at bedtime. )  . [DISCONTINUED] metoprolol tartrate (LOPRESSOR) 25 MG tablet Take 1 tablet (25 mg total) by mouth 2 (two) times daily.   No facility-administered encounter medications on file as of 06/25/2018.     Allergies: Atorvastatin; Sulfa antibiotics; and Sulfonamide derivatives  Body mass index is 28.57 kg/m.  Blood pressure 106/61, pulse 90, temperature  98.4 F (36.9 C), temperature source Oral, height 5\' 9"  (1.753 m), weight 193 lb 8 oz (87.8 kg), SpO2 99 %.  Review of Systems  Constitutional: Positive for fatigue. Negative for activity change, appetite change, chills, diaphoresis, fever and unexpected weight change.  Eyes: Negative for visual disturbance.  Respiratory: Negative for cough, chest tightness, shortness of breath, wheezing and stridor.   Cardiovascular: Negative for chest pain, palpitations and leg swelling.  Gastrointestinal: Negative for abdominal distention, abdominal pain, blood in stool, constipation, diarrhea, nausea and vomiting.  Endocrine: Negative for cold intolerance, heat intolerance, polydipsia, polyphagia and polyuria.  Genitourinary: Positive for urgency. Negative for difficulty urinating and flank pain.       Nocturia   Neurological: Positive for tremors. Negative for dizziness and headaches.  Hematological: Bruises/bleeds easily.  Psychiatric/Behavioral: Negative for hallucinations, self-injury, sleep disturbance and suicidal ideas. The patient is not nervous/anxious and is not hyperactive.        Objective:   Physical Exam  Constitutional: He is oriented to person, place, and time. He appears well-developed and well-nourished. No distress.  HENT:  Head: Normocephalic and atraumatic.  Right Ear: External ear normal.  Left Ear: External ear normal.  Nose: Nose normal.  Mouth/Throat: Oropharynx is clear and moist.  Cardiovascular: Normal rate, regular rhythm and intact distal pulses.  Murmur heard. Pulmonary/Chest: Effort normal and breath sounds normal. No stridor. No respiratory distress. He has no wheezes. He has no rales. He exhibits no tenderness.  Neurological: He is alert and oriented to person, place, and time. He displays tremor.  Skin: Skin is warm and dry. Capillary refill takes less than 2 seconds. No rash noted. He is not diaphoretic. No erythema. No pallor.  Psychiatric: He has a normal mood  and affect. His speech is normal and behavior is normal. Judgment and thought content normal. He exhibits abnormal recent memory.  Nursing note reviewed.     Assessment & Plan:   1. Hyperlipidemia, unspecified hyperlipidemia type   2. Healthcare maintenance   3. Essential hypertension     Healthcare maintenance Continue all medications as directed, with one change- Decrease Lisinopril from 20mg  once daily to 10mg  once daily. Please check blood pressure and heart rate daily and follow-up with [place] in 2 weeks. Increase water intake and follow a heart healthy diet. Be careful when changing positions. We will send message to Dr. Gwenlyn Found about your blood pressure readings, new onset dizziness, and recent decrease  in Lisinopril. Recommend that you follow-up with Dr. Gwenlyn Found in 4 weeks, sooner if needed. Recommend physical with A1c re-check in 3 months.  Hyperlipidemia Again declined statin therapy or referral to lipid clinic [facility]   Hypertension Today both BP checks were quite low in comparison to his baseline- 105/64. HR 78, 106/61, HR 90 He is currently on Lisinopril 20mg  QD and Metoprolol 25mg  BID Reduced Lisinopril from 20mg  to 10mg  QD Please check blood pressure and heart rate daily and follow-up with [place] in 2 weeks. Increase water intake and follow a heart healthy diet. Be careful when changing positions. We will send message to Dr. Gwenlyn Found about your blood pressure readings, new onset dizziness, and recent decrease in Lisinopril. Recommend that you follow-up with Dr. Gwenlyn Found in 4 weeks, sooner if needed.   Pt was in the office today for 45+ minutes, I spent >75%  Of time  in face to face counseling of various medical concerns and in coordination of care   FOLLOW-UP:  Return in about 2 weeks (around 07/09/2018) for Evaluate Medication Effectiveness.

## 2018-06-25 ENCOUNTER — Encounter: Payer: Self-pay | Admitting: Adult Health

## 2018-06-25 ENCOUNTER — Ambulatory Visit (INDEPENDENT_AMBULATORY_CARE_PROVIDER_SITE_OTHER): Payer: Medicare Other | Admitting: Adult Health

## 2018-06-25 VITALS — BP 106/61 | HR 90 | Temp 98.4°F | Ht 69.0 in | Wt 193.5 lb

## 2018-06-25 DIAGNOSIS — I10 Essential (primary) hypertension: Secondary | ICD-10-CM

## 2018-06-25 DIAGNOSIS — Z0000 Encounter for general adult medical examination without abnormal findings: Secondary | ICD-10-CM | POA: Diagnosis not present

## 2018-06-25 DIAGNOSIS — E785 Hyperlipidemia, unspecified: Secondary | ICD-10-CM | POA: Diagnosis not present

## 2018-06-25 MED ORDER — LISINOPRIL 10 MG PO TABS: 10.0000 mg | ORAL_TABLET | Freq: Every day | ORAL | 0 refills | Status: DC

## 2018-06-25 MED ORDER — METOPROLOL TARTRATE 25 MG PO TABS: 25.0000 mg | ORAL_TABLET | Freq: Two times a day (BID) | ORAL | 1 refills | Status: DC

## 2018-06-25 NOTE — Assessment & Plan Note (Signed)
Again declined statin therapy or referral to lipid clinic [facility]

## 2018-06-25 NOTE — Assessment & Plan Note (Addendum)
Today both BP checks were quite low in comparison to his baseline- 105/64. HR 78, 106/61, HR 90 He is currently on Lisinopril 20mg  QD and Metoprolol 25mg  BID Reduced Lisinopril from 20mg  to 10mg  QD Please check blood pressure and heart rate daily and follow-up with [place] in 2 weeks. Increase water intake and follow a heart healthy diet. Be careful when changing positions. We will send message to Dr. Gwenlyn Found about your blood pressure readings, new onset dizziness, and recent decrease in Lisinopril. Recommend that you follow-up with Dr. Gwenlyn Found in 4 weeks, sooner if needed.

## 2018-06-25 NOTE — Patient Instructions (Addendum)
Hypotension Hypotension, commonly called low blood pressure, is when the force of blood pumping through your arteries is too weak. Arteries are blood vessels that carry blood from the heart throughout the body. When blood pressure is too low, you may not get enough blood to your brain or to the rest of your organs. This can cause weakness, light-headedness, rapid heartbeat, and fainting. Depending on the cause and severity, hypotension may be harmless (benign) or cause serious problems (critical). What are the causes? Possible causes of hypotension include:  Blood loss.  Loss of body fluids (dehydration).  Heart problems.  Hormone (endocrine) problems.  Pregnancy.  Severe infection.  Lack of certain nutrients.  Severe allergic reactions (anaphylaxis).  Certain medicines, such as blood pressure medicine or medicines that make the body lose excess fluids (diuretics). Sometimes, hypotension can be caused by not taking medicine as directed, such as taking too much of a certain medicine.  What increases the risk? Certain factors can make you more likely to develop hypotension, including:  Age. Risk increases as you get older.  Conditions that affect the heart or the central nervous system.  Taking certain medicines, such as blood pressure medicine or diuretics.  Being pregnant.  What are the signs or symptoms? Symptoms of this condition may include:  Weakness.  Light-headedness.  Dizziness.  Blurred vision.  Fatigue.  Rapid heartbeat.  Fainting, in severe cases.  How is this diagnosed? This condition is diagnosed based on:  Your medical history.  Your symptoms.  Your blood pressure measurement. Your health care provider will check your blood pressure when you are: ? Lying down. ? Sitting. ? Standing.  A blood pressure reading is recorded as two numbers, such as "120 over 80" (or 120/80). The first ("top") number is called the systolic pressure. It is a  measure of the pressure in your arteries as your heart beats. The second ("bottom") number is called the diastolic pressure. It is a measure of the pressure in your arteries when your heart relaxes between beats. Blood pressure is measured in a unit called mm Hg. Healthy blood pressure for adults is 120/80. If your blood pressure is below 90/60, you may be diagnosed with hypotension. Other information or tests that may be used to diagnose hypotension include:  Your other vital signs, such as your heart rate and temperature.  Blood tests.  Tilt table test. For this test, you will be safely secured to a table that moves you from a lying position to an upright position. Your heart rhythm and blood pressure will be monitored during the test.  How is this treated? Treatment for this condition may include:  Changing your diet. This may involve eating more salt (sodium) or drinking more water.  Taking medicines to raise your blood pressure.  Changing the dosage of certain medicines you are taking that might be lowering your blood pressure.  Wearing compression stockings. These stockings help to prevent blood clots and reduce swelling in your legs.  In some cases, you may need to go to the hospital for:  Fluid replacement. This means you will receive fluids through an IV tube.  Blood replacement. This means you will receive donated blood through an IV tube (transfusion).  Treating an infection or heart problems, if this applies.  Monitoring. You may need to be monitored while medicines that you are taking wear off.  Follow these instructions at home: Eating and drinking   Drink enough fluid to keep your urine clear or pale yellow.  Eat a healthy diet and follow instructions from your health care provider about eating or drinking restrictions. A healthy diet includes: ? Fresh fruits and vegetables. ? Whole grains. ? Lean meats. ? Low-fat dairy products.  Eat extra salt only as  directed. Do not add extra salt to your diet unless your health care provider told you to do that.  Eat frequent, small meals.  Avoid standing up suddenly after eating. Medicines  Take over-the-counter and prescription medicines only as told by your health care provider. ? Follow instructions from your health care provider about changing the dosage of your current medicines, if this applies. ? Do not stop or adjust any of your medicines on your own. General instructions  Wear compression stockings as told by your health care provider.  Get up slowly from lying down or sitting positions. This gives your blood pressure a chance to adjust.  Avoid hot showers and excessive heat as directed by your health care provider.  Return to your normal activities as told by your health care provider. Ask your health care provider what activities are safe for you.  Do not use any products that contain nicotine or tobacco, such as cigarettes and e-cigarettes. If you need help quitting, ask your health care provider.  Keep all follow-up visits as told by your health care provider. This is important. Contact a health care provider if:  You vomit.  You have diarrhea.  You have a fever for more than 2-3 days.  You feel more thirsty than usual.  You feel weak and tired. Get help right away if:  You have chest pain.  You have a fast or irregular heartbeat.  You develop numbness in any part of your body.  You cannot move your arms or your legs.  You have trouble speaking.  You become sweaty or feel light-headed.  You faint.  You feel short of breath.  You have trouble staying awake.  You feel confused. This information is not intended to replace advice given to you by your health care provider. Make sure you discuss any questions you have with your health care provider. Document Released: 07/10/2005 Document Revised: 01/28/2016 Document Reviewed: 12/30/2015 Elsevier Interactive  Patient Education  2018 [place] all medications as directed, with one change- Decrease Lisinopril from 20mg  once daily to 10mg  once daily. Please check blood pressure and heart rate daily and follow-up with [place] in 2 weeks. Increase water intake and follow a heart healthy diet. Be careful when changing positions. We will send message to Dr. Gwenlyn Found about your blood pressure readings, new onset dizziness, and recent decrease in Lisinopril. Recommend that you follow-up with Dr. Gwenlyn Found in 4 weeks, sooner if needed. Recommend physical with A1c re-check in 3 months. NICE TO SEE YOU!

## 2018-06-25 NOTE — Assessment & Plan Note (Signed)
Continue all medications as directed, with one change- Decrease Lisinopril from 20mg  once daily to 10mg  once daily. Please check blood pressure and heart rate daily and follow-up with [place] in 2 weeks. Increase water intake and follow a heart healthy diet. Be careful when changing positions. We will send message to Dr. Gwenlyn Found about your blood pressure readings, new onset dizziness, and recent decrease in Lisinopril. Recommend that you follow-up with Dr. Gwenlyn Found in 4 weeks, sooner if needed. Recommend physical with A1c re-check in 3 months.

## 2018-07-08 ENCOUNTER — Ambulatory Visit (INDEPENDENT_AMBULATORY_CARE_PROVIDER_SITE_OTHER): Payer: Medicare Other | Admitting: Pharmacist

## 2018-07-08 DIAGNOSIS — Z86711 Personal history of pulmonary embolism: Secondary | ICD-10-CM

## 2018-07-08 DIAGNOSIS — Z7901 Long term (current) use of anticoagulants: Secondary | ICD-10-CM | POA: Diagnosis not present

## 2018-07-08 DIAGNOSIS — Z952 Presence of prosthetic heart valve: Secondary | ICD-10-CM | POA: Diagnosis not present

## 2018-07-08 LAB — POCT INR: INR: 1.9 — AB (ref 2.0–3.0)

## 2018-07-21 NOTE — Progress Notes (Addendum)
Subjective:    Patient ID: Curtis Brennan, male    DOB: Nov 29, 1931, 84 y.o.   MRN: [id number]  HPI: 05/07/18 OV:  Curtis Brennan is here to establish as a new pt.  He is a pleasant 84 year old male. PMH: HTN, HLD, CAD, CABG, AVR, Chronic Anticoagulation for pulmonary emboli. 04/28/18- Hospital Admission for TIA "TIA-  Curtis Brennan presented with an acute onset of memory loss,resolved in ~10 min. He had preceding headachetoday;blurred vision and generalized weakness for a couple months. He could not remember the names of any of his family members although he was able to recognize them. CT head showed no acute abnormality, chronic microvascular ischemic change, atherosclerosis. MRA showed severe proximal right PCA and bilateral proximal M2 stenoses; no acute abnormality and moderate chronic small vessel ischemic disease. Hgb A1c 6.3; 6.2 6 years ago. HDL 31, LDL 104; intolerant to Lipitor and pravochol. Neurology recommended considering other statin therapies. Cardiology was consulted and recommended outpatient follow up with their lipid clinic for medical management possibly with PCSK9 inhibitors. Echo and vascular US of carotids was pending at the time of discharge. Discharged with plans to continue all home medications and recommendation to closely follow up with PCP and cardiology." He reports that his INR was being checked every 2-3 months at previous PCP and reports easy bruising. He reports being followed by Dr. Berry/Cardiology annually, last OV 11/30/17 He states "Dr. Kennon Holter nurse called and told me R neck was fine my L neck hasn't changed in years and to see him in a year". He reports that his brother passed away at age 74 from CVA and his mother passed away at age 21 from brain cancer. When he was reaching into bag to produce pill bottles, fine tremor of upper extremities noted, asked when that started he stated "about 6-8 months ago". Reviewed all recent hospital imaging, notes, labs Discussed  the recommendation to be seen by Cardiology, Neurology, Lipid Clinic He declined referral to Neurology at this time   06/25/18 OV: Curtis Brennan is here for f/U; HTN, HLD, CAD He was seen by Coumadin clinic 05/09/18, last INR 1.5 He denies any unusual bleeding/bruising. He is aware to avoid foods high in Vit k. He reports experiencing dizziness, especially with position changes the last 6 weeks. He reports nearly falling when standing up to quickly from his "arm chair" a few weeks ago. Today both BP checks were quite low in comparison to his baseline- 105/64. HR 78, 106/61, HR 90 He is currently on Lisinopril 20mg  QD and Metoprolol 25mg  BID He estimates to drink >40 oz water/day, eats a "pretty well" and "gets things done around the house". His again declined referral to Neurology Last seen by Dr. Melynda Ripple May 2019, he has not updated them on his low BP or dizziness  Wife at [employer] during [place]    07/23/18 OV: Curtis Brennan is here for f/u: reduction of ACE- lisinopril 20mg  to 10mg  four weeks ago due to OV BP of 106/61, when his pressure usually runs SBP 150-160s Home readings- 110-180, mean 140 DBP 60-90, mean 80 HR 70-90, mean 80 Since BP rose, he went back up to full Lisinopril 20mg  tablet, he has continued  Metoprolol 25mg  BID He denies CP/dyspnea/dizziness/HA/palpitations He denies a diet high in sodium, estimates to drink 40 oz plain water/day He has f/u with cards/Dr. Gwenlyn Found Spring 2020   Patient Care Team    Relationship Specialty Notifications Start End  Tijah Hane, Berna Spare, NP PCP - General Family  Medicine  05/07/18   [name], MD PCP - Cardiology Cardiology Admissions 07/20/17   Grace Isaac, MD Consulting Physician Cardiothoracic Surgery  12/15/11   [name], MD Attending Physician Cardiology  12/15/11     Patient Active Problem List   Diagnosis Date Noted  . Hypertension 06/25/2018  . Long term (current) use of anticoagulants 05/09/2018  . Healthcare  maintenance 05/07/2018  . S/P AVR (aortic valve replacement)   . Coronary artery disease involving coronary bypass graft of native heart without angina pectoris   . Hx of pulmonary embolus   . Hyperlipidemia   . TIA (transient ischemic attack) 04/28/2018  . Bilateral lower extremity edema 11/30/2017  . PAF s/p CABG/AVR 12/26/2011  . Unstable angina ([place]) 12/26/2011  . COPD by CXR 12/16/2011  . Hx of CABG 12/15/2011  . AS, s/p Brennan AVR 12/20/11 12/15/2011  . Chronic anticoagulation, on coumadin for pul emboli 12/15/2011  . Carotid artery disease, bilateral, asymptomatic 12/15/2011  . Dyslipidemia 10/28/2008  . Benign essential HTN 10/28/2008  . History of prior pulm embolism 10/28/2008  . PULMONARY HYPERTENSION 10/28/2008     Past Medical History:  Diagnosis Date  . Angina   . AS (aortic stenosis) moderate to severe by cath 12/14/11 for AVR  12/15/2011  . CAD (coronary artery disease) with history of LAD & Diag athrectomy in 1994, now with severe 3 vesseal CAD by cath 12/14/11 for CABG 12/15/2011  . Carotid artery disease, bilateral 12/15/2011  . Chronic anticoagulation, on coumadin for pul emboli 12/15/2011  . Complication of anesthesia 07/1997   "just wouldn't wake wakeup"  . Coronary artery disease   . Exertional dyspnea   . Heart murmur   . Hypertension   . Kidney stones   . Legionnaire's disease ([place]) 1986  . Pneumonia    "once"  . PONV (postoperative nausea and vomiting)   . Prostate cancer ([place])   . Pulmonary embolism ([place]) ~ 2011   "both lungs"  . Unstable angina ([place]) 12/15/2011     Past Surgical History:  Procedure Laterality Date  . AORTIC VALVE REPLACEMENT  12/20/2011   Procedure: AORTIC VALVE REPLACEMENT (AVR);  Surgeon: Grace Isaac, MD;  Location: [place];  Service: Open Heart Surgery;  Laterality: N/A;  . CARDIAC CATHETERIZATION  12/14/11  . CHOLECYSTECTOMY  2001  . CORONARY ANGIOPLASTY  1994   "roto rooter"  . CORONARY ARTERY BYPASS GRAFT  12/20/2011    Procedure: CORONARY ARTERY BYPASS GRAFTING (CABG);  Surgeon: Grace Isaac, MD;  Location: [place];  Service: Open Heart Surgery;  Laterality: N/A;  . CYSTOSCOPY  12/20/2011   Procedure: CYSTOSCOPY;  Surgeon: Fredricka Bonine, MD;  Location: [place];  Service: Urology;  Laterality: N/A;  . gallstone removed  ~ 2010   "was lodged below pancreatis"  . LEFT HEART CATHETERIZATION WITH CORONARY ANGIOGRAM N/A 12/14/2011   Procedure: LEFT HEART CATHETERIZATION WITH CORONARY ANGIOGRAM;  Surgeon: [name], MD;  Location[facility] CATH LAB;  Service: Cardiovascular;  Laterality: N/A;  . LITHOTRIPSY  2006  . MASTOIDECTOMY Left   . PROSTATE SURGERY  07/1997  . RIGHT HEART CATHETERIZATION  12/14/2011   Procedure: RIGHT HEART CATH;  Surgeon: [name], MD;  Location[facility];  Service: Cardiovascular;;  . TYMPANIC MEMBRANE REPAIR Right      Family History  Problem Relation Age of Onset  . Cancer Mother        brain  . Cancer Sister  breast and pancreatic  . Alcohol abuse Brother   . Hypertension Brother   . Stroke Brother      Social History   Substance and Sexual Activity  Drug Use No     Social History   Substance and Sexual Activity  Alcohol Use No     Social History   Tobacco Use  Smoking Status Never Smoker  Smokeless Tobacco Never Used     Outpatient Encounter Medications as of 07/23/2018  Medication Sig  . aspirin EC 81 MG tablet Take 81 mg by mouth at bedtime.  [name] lisinopril (PRINIVIL,ZESTRIL) 20 MG tablet Take 20 mg by mouth daily.  . metoprolol tartrate (LOPRESSOR) 25 MG tablet Take 1 tablet (25 mg total) by mouth 2 (two) times daily.  . vitamin B-12 (CYANOCOBALAMIN) 1000 MCG tablet Take 1,000 mcg by mouth daily.  [name] warfarin (COUMADIN) 5 MG tablet Take 2.5-5 mg by mouth See admin instructions. Take 1/2 tablet (2.5 mg) by mouth Sunday, Tuesday, Thursday evening, take 1 tablet (5 mg) Monday, Wednesday, Friday, Saturday evening.  . [DISCONTINUED]  lisinopril (PRINIVIL,ZESTRIL) 10 MG tablet Take 1 tablet (10 mg total) by mouth daily.   No facility-administered encounter medications on file as of 07/23/2018.     Allergies: Atorvastatin; Sulfa antibiotics; and Sulfonamide derivatives  Body mass index is 28.65 kg/m.  Blood pressure (!) 152/72, pulse 70, temperature 98 F (36.7 C), temperature source Oral, height 5\' 9"  (1.753 m), weight 194 lb (88 kg), SpO2 98 %.  Review of Systems  Constitutional: Positive for fatigue. Negative for activity change, appetite change, chills, diaphoresis, fever and unexpected weight change.  Eyes: Negative for visual disturbance.  Respiratory: Negative for cough, chest tightness, shortness of breath, wheezing and stridor.   Cardiovascular: Negative for chest pain, palpitations and leg swelling.  Gastrointestinal: Negative for abdominal distention, abdominal pain, blood in stool, constipation, diarrhea, nausea and vomiting.  Endocrine: Negative for cold intolerance, heat intolerance, polydipsia, polyphagia and polyuria.  Genitourinary: Positive for urgency. Negative for difficulty urinating and flank pain.       Nocturia   Neurological: Positive for tremors. Negative for dizziness and headaches.  Hematological: Bruises/bleeds easily.  Psychiatric/Behavioral: Negative for hallucinations, self-injury, sleep disturbance and suicidal ideas. The patient is not nervous/anxious and is not hyperactive.        Objective:   Physical Exam Nursing note reviewed.  Constitutional:      General: He is not in acute distress.    Appearance: He is well-developed. He is not diaphoretic.  HENT:     Head: Normocephalic and atraumatic.     Right Ear: External ear normal.     Left Ear: External ear normal.     Nose: Nose normal.  Cardiovascular:     Rate and Rhythm: Normal rate and regular rhythm.     Heart sounds: Murmur present.  Pulmonary:     Effort: Pulmonary effort is normal. No respiratory distress.      Breath sounds: Normal breath sounds. No stridor. No wheezing or rales.  Chest:     Chest wall: No tenderness.  Skin:    General: Skin is warm and dry.     Capillary Refill: Capillary refill takes less than 2 seconds.     Coloration: Skin is not pale.     Findings: No erythema or rash.  Neurological:     Mental Status: He is alert and oriented to person, place, and time.     Motor: Tremor present.  Psychiatric:  Speech: Speech normal.        Behavior: Behavior normal.        Thought Content: Thought content normal.        Cognition and Memory: He exhibits impaired recent memory.        Judgment: Judgment normal.       Assessment & Plan:   1. Benign essential HTN     Benign essential HTN Continue all medications as directed. BP 152/72- he did not take anti-hypertensives today If blood pressure is consistently either <100/60 or >140/90 or you develop, please call your cardiologist Please follow-up in 6 months- Medicare Wellness Visit with fasting labs.    FOLLOW-UP:  Return in about 6 months (around 01/21/2019) for Medicare Wellness, Fasting Labs.

## 2018-07-23 ENCOUNTER — Ambulatory Visit (INDEPENDENT_AMBULATORY_CARE_PROVIDER_SITE_OTHER): Payer: Medicare Other | Admitting: Adult Health

## 2018-07-23 ENCOUNTER — Encounter: Payer: Self-pay | Admitting: Adult Health

## 2018-07-23 DIAGNOSIS — I10 Essential (primary) hypertension: Secondary | ICD-10-CM | POA: Diagnosis not present

## 2018-07-23 NOTE — Assessment & Plan Note (Addendum)
Continue all medications as directed. BP 152/72- he did not take anti-hypertensives today If blood pressure is consistently either <100/60 or >140/90 or you develop, please call your cardiologist Please follow-up in 6 months- Medicare Wellness Visit with fasting labs.

## 2018-07-23 NOTE — Patient Instructions (Addendum)
Managing Your Hypertension Hypertension is commonly called high blood pressure. This is when the force of your blood pressing against the walls of your arteries is too strong. Arteries are blood vessels that carry blood from your heart throughout your body. Hypertension forces the heart to work harder to pump blood, and may cause the arteries to become narrow or stiff. Having untreated or uncontrolled hypertension can cause heart attack, stroke, kidney disease, and other problems. What are blood pressure readings? A blood pressure reading consists of a higher number over a lower number. Ideally, your blood pressure should be below 120/80. The first ("top") number is called the systolic pressure. It is a measure of the pressure in your arteries as your heart beats. The second ("bottom") number is called the diastolic pressure. It is a measure of the pressure in your arteries as the heart relaxes. What does my blood pressure reading mean? Blood pressure is classified into four stages. Based on your blood pressure reading, your health care provider may use the following stages to determine what type of treatment you need, if any. Systolic pressure and diastolic pressure are measured in a unit called mm Hg. Normal  Systolic pressure: below 120.  Diastolic pressure: below 80. Elevated  Systolic pressure: 120-129.  Diastolic pressure: below 80. Hypertension stage 1  Systolic pressure: 130-139.  Diastolic pressure: 80-89. Hypertension stage 2  Systolic pressure: 140 or above.  Diastolic pressure: 90 or above. What health risks are associated with hypertension? Managing your hypertension is an important responsibility. Uncontrolled hypertension can lead to:  A heart attack.  A stroke.  A weakened blood vessel (aneurysm).  Heart failure.  Kidney damage.  Eye damage.  Metabolic syndrome.  Memory and concentration problems. What changes can I make to manage my  hypertension? Hypertension can be managed by making lifestyle changes and possibly by taking medicines. Your health care provider will help you make a plan to bring your blood pressure within a normal range. Eating and drinking   Eat a diet that is high in fiber and potassium, and low in salt (sodium), added sugar, and fat. An example eating plan is called the DASH (Dietary Approaches to Stop Hypertension) diet. To eat this way: ? Eat plenty of fresh fruits and vegetables. Try to fill half of your plate at each meal with fruits and vegetables. ? Eat whole grains, such as whole wheat pasta, brown rice, or whole grain bread. Fill about one quarter of your plate with whole grains. ? Eat low-fat diary products. ? Avoid fatty cuts of meat, processed or cured meats, and poultry with skin. Fill about one quarter of your plate with lean proteins such as fish, chicken without skin, beans, eggs, and tofu. ? Avoid premade and processed foods. These tend to be higher in sodium, added sugar, and fat.  Reduce your daily sodium intake. Most people with hypertension should eat less than 1,500 mg of sodium a day.  Limit alcohol intake to no more than 1 drink a day for nonpregnant women and 2 drinks a day for men. One drink equals 12 oz of beer, 5 oz of wine, or 1 oz of hard liquor. Lifestyle  Work with your health care provider to maintain a healthy body weight, or to lose weight. Ask what an ideal weight is for you.  Get at least 30 minutes of exercise that causes your heart to beat faster (aerobic exercise) most days of the week. Activities may include walking, swimming, or biking.  Include exercise   to strengthen your muscles (resistance exercise), such as weight lifting, as part of your weekly exercise routine. Try to do these types of exercises for 30 minutes at least 3 days a week.  Do not use any products that contain nicotine or tobacco, such as cigarettes and e-cigarettes. If you need help quitting,  ask your health care provider.  Control any long-term (chronic) conditions you have, such as high cholesterol or diabetes. Monitoring  Monitor your blood pressure at home as told by your health care provider. Your personal target blood pressure may vary depending on your medical conditions, your age, and other factors.  Have your blood pressure checked regularly, as often as told by your health care provider. Working with your health care provider  Review all the medicines you take with your health care provider because there may be side effects or interactions.  Talk with your health care provider about your diet, exercise habits, and other lifestyle factors that may be contributing to hypertension.  Visit your health care provider regularly. Your health care provider can help you create and adjust your plan for managing hypertension. Will I need medicine to control my blood pressure? Your health care provider may prescribe medicine if lifestyle changes are not enough to get your blood pressure under control, and if:  Your systolic blood pressure is 130 or higher.  Your diastolic blood pressure is 80 or higher. Take medicines only as told by your health care provider. Follow the directions carefully. Blood pressure medicines must be taken as prescribed. The medicine does not work as well when you skip doses. Skipping doses also puts you at risk for problems. Contact a health care provider if:  You think you are having a reaction to medicines you have taken.  You have repeated (recurrent) headaches.  You feel dizzy.  You have swelling in your ankles.  You have trouble with your vision. Get help right away if:  You develop a severe headache or confusion.  You have unusual weakness or numbness, or you feel faint.  You have severe pain in your chest or abdomen.  You vomit repeatedly.  You have trouble breathing. Summary  Hypertension is when the force of blood pumping  through your arteries is too strong. If this condition is not controlled, it may put you at risk for serious complications.  Your personal target blood pressure may vary depending on your medical conditions, your age, and other factors. For most people, a normal blood pressure is less than 120/80.  Hypertension is managed by lifestyle changes, medicines, or both. Lifestyle changes include weight loss, eating a healthy, low-sodium diet, exercising more, and limiting alcohol. This information is not intended to replace advice given to you by your health care provider. Make sure you discuss any questions you have with your health care provider. Document Released: 04/03/2012 Document Revised: 06/07/2016 Document Reviewed: 06/07/2016 Elsevier Interactive Patient Education  2019 [place] all medications as directed. If blood pressure is consistently either <100/60 or >140/90 or you develop, please call your cardiologist Please follow-up in 6 months- Medicare Wellness Visit with fasting labs. HAPPY NEW YEAR!

## 2018-08-29 ENCOUNTER — Other Ambulatory Visit: Payer: Self-pay | Admitting: Pharmacist Clinician (PhC)/ Clinical Pharmacy Specialist

## 2018-08-29 MED ORDER — WARFARIN SODIUM 5 MG PO TABS: ORAL_TABLET | ORAL | 1 refills | Status: DC

## 2018-09-02 ENCOUNTER — Ambulatory Visit (INDEPENDENT_AMBULATORY_CARE_PROVIDER_SITE_OTHER): Payer: Medicare Other | Admitting: *Deleted

## 2018-09-02 DIAGNOSIS — Z86711 Personal history of pulmonary embolism: Secondary | ICD-10-CM | POA: Diagnosis not present

## 2018-09-02 DIAGNOSIS — Z7901 Long term (current) use of anticoagulants: Secondary | ICD-10-CM | POA: Diagnosis not present

## 2018-09-02 DIAGNOSIS — Z952 Presence of prosthetic heart valve: Secondary | ICD-10-CM | POA: Diagnosis not present

## 2018-09-02 LAB — POCT INR: INR: 1.9 — AB (ref 2.0–3.0)

## 2018-09-02 NOTE — Patient Instructions (Signed)
Description    Change your dose to 1 tablet everyday except 1/2 tablet on Sundays and Thursdays.  Next INR in 8 weeks.

## 2018-09-25 ENCOUNTER — Encounter: Payer: Self-pay | Admitting: Cardiovascular Disease

## 2018-10-02 ENCOUNTER — Telehealth: Payer: Self-pay | Admitting: Cardiovascular Disease

## 2018-10-02 DIAGNOSIS — Z953 Presence of xenogenic heart valve: Secondary | ICD-10-CM

## 2018-10-02 NOTE — Telephone Encounter (Signed)
Future order for echo in Epic.Routed to scheduling

## 2018-10-02 NOTE — Telephone Encounter (Signed)
Will forward to Dr Berry for review  

## 2018-10-02 NOTE — Telephone Encounter (Signed)
He is [place] correct about that.  He did have an echo in November.  This can be repeated in November of this year.

## 2018-10-02 NOTE — Telephone Encounter (Signed)
New Message   Pt is calling because he doesn't think he needs to have an Echo done because he had one in November when he was in the hospital  Please call

## 2018-10-04 ENCOUNTER — Other Ambulatory Visit: Payer: Self-pay | Admitting: Cardiovascular Disease

## 2018-10-04 ENCOUNTER — Other Ambulatory Visit: Payer: Self-pay | Admitting: Pharmacist Clinician (PhC)/ Clinical Pharmacy Specialist

## 2018-10-04 MED ORDER — WARFARIN SODIUM 5 MG PO TABS: ORAL_TABLET | ORAL | 1 refills | Status: DC

## 2018-10-24 ENCOUNTER — Telehealth: Payer: Self-pay

## 2018-10-24 NOTE — Telephone Encounter (Signed)
lmom for prescreen/drive thru 

## 2018-10-25 NOTE — Telephone Encounter (Signed)
Called to prescreen for appt but the pt wished to cancel

## 2018-10-30 ENCOUNTER — Telehealth: Payer: Self-pay | Admitting: Pharmacist

## 2018-10-30 NOTE — Telephone Encounter (Signed)

## 2018-10-30 NOTE — Telephone Encounter (Signed)
Follow Up:    Pt wants to know which office will he go to for his drive through Coumdin-s

## 2018-10-30 NOTE — Telephone Encounter (Signed)
New Message:    Pt wants to know where does he need to go for his drive through Coumadin check?

## 2018-10-30 NOTE — Telephone Encounter (Signed)
Appt time, date and address with patient

## 2018-10-31 ENCOUNTER — Ambulatory Visit (INDEPENDENT_AMBULATORY_CARE_PROVIDER_SITE_OTHER): Payer: Medicare Other | Admitting: Pharmacist

## 2018-10-31 ENCOUNTER — Other Ambulatory Visit: Payer: Self-pay

## 2018-10-31 DIAGNOSIS — Z952 Presence of prosthetic heart valve: Secondary | ICD-10-CM

## 2018-10-31 DIAGNOSIS — Z86711 Personal history of pulmonary embolism: Secondary | ICD-10-CM | POA: Diagnosis not present

## 2018-10-31 DIAGNOSIS — Z7901 Long term (current) use of anticoagulants: Secondary | ICD-10-CM

## 2018-10-31 LAB — POCT INR: INR: 2 (ref 2.0–3.0)

## 2018-11-07 ENCOUNTER — Other Ambulatory Visit: Payer: Self-pay | Admitting: Cardiovascular Disease

## 2018-11-19 ENCOUNTER — Other Ambulatory Visit: Payer: Self-pay

## 2018-11-19 NOTE — Telephone Encounter (Signed)
Mina Marble, NP would like for Dr. Gwenlyn Found to manage all cardiac medications for this patient.  Thanks!  [name], CMA

## 2018-11-26 ENCOUNTER — Telehealth: Payer: Self-pay | Admitting: Adult Health

## 2018-11-26 MED ORDER — METOPROLOL TARTRATE 25 MG PO TABS: 25.0000 mg | ORAL_TABLET | Freq: Two times a day (BID) | ORAL | 0 refills | Status: DC

## 2018-11-26 NOTE — Telephone Encounter (Signed)
Patient is requesting a refill of his Lopressor, if approved please send to [facility]. Patient is requesting a 3 mnth supply for insurance if possible.

## 2018-11-26 NOTE — Addendum Note (Signed)
Addended by: Fonnie Mu on: 11/26/2018 10:24 AM   Modules accepted: Orders

## 2018-12-06 ENCOUNTER — Encounter: Payer: Self-pay | Admitting: Cardiovascular Disease

## 2018-12-06 ENCOUNTER — Telehealth: Payer: Self-pay

## 2018-12-06 ENCOUNTER — Telehealth (INDEPENDENT_AMBULATORY_CARE_PROVIDER_SITE_OTHER): Payer: Medicare Other | Admitting: Cardiovascular Disease

## 2018-12-06 VITALS — BP 158/73 | HR 87 | Ht 69.0 in | Wt 192.0 lb

## 2018-12-06 DIAGNOSIS — I10 Essential (primary) hypertension: Secondary | ICD-10-CM | POA: Diagnosis not present

## 2018-12-06 DIAGNOSIS — Z86711 Personal history of pulmonary embolism: Secondary | ICD-10-CM

## 2018-12-06 DIAGNOSIS — E782 Mixed hyperlipidemia: Secondary | ICD-10-CM

## 2018-12-06 DIAGNOSIS — Z952 Presence of prosthetic heart valve: Secondary | ICD-10-CM

## 2018-12-06 DIAGNOSIS — Z951 Presence of aortocoronary bypass graft: Secondary | ICD-10-CM

## 2018-12-06 DIAGNOSIS — E785 Hyperlipidemia, unspecified: Secondary | ICD-10-CM

## 2018-12-06 DIAGNOSIS — I480 Paroxysmal atrial fibrillation: Secondary | ICD-10-CM

## 2018-12-06 DIAGNOSIS — Z7901 Long term (current) use of anticoagulants: Secondary | ICD-10-CM | POA: Diagnosis not present

## 2018-12-06 DIAGNOSIS — I6523 Occlusion and stenosis of bilateral carotid arteries: Secondary | ICD-10-CM

## 2018-12-06 DIAGNOSIS — I6522 Occlusion and stenosis of left carotid artery: Secondary | ICD-10-CM

## 2018-12-06 NOTE — Progress Notes (Signed)
Virtual Visit via Telephone Note   This visit type was conducted due to national recommendations for restrictions regarding the COVID-19 Pandemic (e.g. social distancing) in an effort to limit this patient's exposure and mitigate transmission in our community.  Due to his co-morbid illnesses, this patient is at least at moderate risk for complications without adequate follow up.  This format is felt to be most appropriate for this patient at this time.  The patient did not have access to video technology/had technical difficulties with video requiring transitioning to audio format only (telephone).  All issues noted in this document were discussed and addressed.  No physical exam could be performed with this format.  Please refer to the patient's chart for his  consent to telehealth for [facility].   Date:  12/06/2018   ID:  Curtis Brennan, DOB 04/22/1932, MRN [id number]  Patient Location: Home Provider Location: Home  PCP:  Esaw Grandchild, NP  Cardiologist:  Quay Burow, MD  Electrophysiologist:  None   Evaluation Performed:  Follow-Up Visit  Chief Complaint: Follow-up CAD and aortic valve replacement  History of Present Illness:     Curtis Brennan is a 85 y.o.  thin appearing married Caucasian male, father of 79, grandfather to 39 and great grandfather to 69 grandchildren who is accompanied by his daughter today. I last saw  11/30/2017. He has a history of coronary arterydisease status post directional coronary atherectomy by myself July 1994. He ultimately underwent coronary arterybypass grafting x35/29/13with a LIMA to his LAD, a vein to the circumflex and to the PDA as well as a tissue AVR for aortic stenosis over the summer time. His other problems include hypertension, hyperlipidemia, intolerant to statin drugs. He does have a remote history of a pulmonary emboli and has been on Coumadin anticoagulation for the last 5 years. He has known asymptomatic moderate left internal  carotid artery stenosis which we are following by duplex ultrasound. He saw [name] in the office July 05, 2012 because of hypertension and his lisinopril was adjusted. He was counseled about salt intake. A 2D echo performed 10/19/14 showed a well-functioning aortic bioprosthesis with normal LV function, and a Myoview stress test was normal as well.  Since I saw him a year ago he is done well.  He is sheltering in place and socially distancing.  He lives with his wife K.  He denies chest pain or shortness of breath.  His last 2D echo performed 04/29/2018 revealed normal LV systolic function with a well-functioning aortic bioprosthesis.  The patient does not have symptoms concerning for COVID-19 infection (fever, chills, cough, or new shortness of breath).    Past Medical History:  Diagnosis Date  . Angina   . AS (aortic stenosis) moderate to severe by cath 12/14/11 for AVR  12/15/2011  . CAD (coronary artery disease) with history of LAD & Diag athrectomy in 1994, now with severe 3 vesseal CAD by cath 12/14/11 for CABG 12/15/2011  . Carotid artery disease, bilateral 12/15/2011  . Chronic anticoagulation, on coumadin for pul emboli 12/15/2011  . Complication of anesthesia 07/1997   "just wouldn't wake wakeup"  . Coronary artery disease   . Exertional dyspnea   . Heart murmur   . Hypertension   . Kidney stones   . Legionnaire's disease ([place]) 1986  . Pneumonia    "once"  . PONV (postoperative nausea and vomiting)   . Prostate cancer ([place])   . Pulmonary embolism ([place]) ~ 2011   "both lungs"  .  Unstable angina ([place]) 12/15/2011   Past Surgical History:  Procedure Laterality Date  . AORTIC VALVE REPLACEMENT  12/20/2011   Procedure: AORTIC VALVE REPLACEMENT (AVR);  Surgeon: Grace Isaac, MD;  Location: [place];  Service: Open Heart Surgery;  Laterality: N/A;  . CARDIAC CATHETERIZATION  12/14/11  . CHOLECYSTECTOMY  2001  . CORONARY ANGIOPLASTY  1994   "roto rooter"  . CORONARY ARTERY BYPASS  GRAFT  12/20/2011   Procedure: CORONARY ARTERY BYPASS GRAFTING (CABG);  Surgeon: Grace Isaac, MD;  Location: [place];  Service: Open Heart Surgery;  Laterality: N/A;  . CYSTOSCOPY  12/20/2011   Procedure: CYSTOSCOPY;  Surgeon: Fredricka Bonine, MD;  Location: [place];  Service: Urology;  Laterality: N/A;  . gallstone removed  ~ 2010   "was lodged below pancreatis"  . LEFT HEART CATHETERIZATION WITH CORONARY ANGIOGRAM N/A 12/14/2011   Procedure: LEFT HEART CATHETERIZATION WITH CORONARY ANGIOGRAM;  Surgeon: [name], MD;  Location: [facility] CATH LAB;  Service: Cardiovascular;  Laterality: N/A;  . LITHOTRIPSY  2006  . MASTOIDECTOMY Left   . PROSTATE SURGERY  07/1997  . RIGHT HEART CATHETERIZATION  12/14/2011   Procedure: RIGHT HEART CATH;  Surgeon: [name], MD;  Location: [facility] CATH LAB;  Service: Cardiovascular;;  . TYMPANIC MEMBRANE REPAIR Right      No outpatient medications have been marked as taking for the 12/06/18 encounter (Appointment) with [name], MD.     Allergies:   Atorvastatin; Sulfa antibiotics; and Sulfonamide derivatives   Social History   Tobacco Use  . Smoking status: Never Smoker  . Smokeless tobacco: Never Used  Substance Use Topics  . Alcohol use: No  . Drug use: No     Family Hx: The patient's family history includes Alcohol abuse in his brother; Cancer in his mother and sister; Hypertension in his brother; Stroke in his brother.  ROS:   Please see the history of present illness.     All other systems reviewed and are negative.   Prior CV studies:   The following studies were reviewed today:  2D echocardiogram 04/29/2018, carotid Doppler studies 10/16/2017  Labs/Other Tests and Data Reviewed:    EKG:  No ECG reviewed.  Recent Labs: 04/28/2018: ALT 22; BUN 30; Creatinine, Ser 1.60; Hemoglobin 15.3; Platelets 155; Potassium 4.5; Sodium 136 05/07/2018: TSH 2.260   Recent Lipid Panel Lab Results  Component Value Date/Time    CHOL 153 04/29/2018 06:42 AM   TRIG 91 04/29/2018 06:42 AM   HDL 31 (L) 04/29/2018 06:42 AM   CHOLHDL 4.9 04/29/2018 06:42 AM   LDLCALC 104 (H) 04/29/2018 06:42 AM    Wt Readings from Last 3 Encounters:  07/23/18 194 lb (88 kg)  06/25/18 193 lb 8 oz (87.8 kg)  05/07/18 191 lb 14.4 oz (87 kg)     Objective:    Vital Signs:  There were no vitals taken for this visit.   VITAL SIGNS:  reviewed a complete physical exam was not performed since this was a virtual telemedicine phone visit  ASSESSMENT & PLAN:    1. Coronary artery disease- history of CAD status post remote directional coronary atherectomy by myself 7/94 and subsequent CABG times 35/29/13 with a LIMA to his LAD, vein to the circumflex and PDA.  He denies chest pain or shortness of breath. 2. Aortic valve replacement- status post prosthetic aortic valve replacement at the time of bypass grafting 12/20/2011.  Recent 2D echo performed 04/29/2018 revealed a well-functioning aortic bioprosthesis.  3. Essential hypertension- history of essential hypertension with blood pressure measured at home at 158/73 which is somewhat high for him since he was "rushing".  He is on lisinopril and metoprolol 4. Hyperlipidemia- history of hyperlipidemia intolerant to statin therapy with lipid profile performed 04/29/2018 revealing total cholesterol 153, LDL 104 and HDL 31 5. Pulmonary emboli- history of remote pulmonary emboli on Coumadin anticoagulation with INRs followed in our [facility] office.  COVID-19 Education: The signs and symptoms of COVID-19 were discussed with the patient and how to seek care for testing (follow up with PCP or arrange E-visit).  The importance of social distancing was discussed today.  Time:   Today, I have spent 8 minutes with the patient with telehealth technology discussing the above problems.     Medication Adjustments/Labs and Tests Ordered: Current medicines are reviewed at length with the patient today.  Concerns  regarding medicines are outlined above.   Tests Ordered: No orders of the defined types were placed in this encounter.   Medication Changes: No orders of the defined types were placed in this encounter.   Disposition:  Follow up in 1 year(s)  Signed, Quay Burow, MD  12/06/2018 7:50 AM    [place]

## 2018-12-06 NOTE — Telephone Encounter (Signed)

## 2018-12-06 NOTE — Patient Instructions (Addendum)
Medication Instructions:  Your physician recommends that you continue on your current medications as directed. Please refer to the Current Medication list given to you today.  If you need a refill on your cardiac medications before your next appointment, please call your pharmacy.   Lab work: NONE If you have labs (blood work) drawn today and your tests are completely normal, you will receive your results only by: [name] MyChart Message (if you have MyChart) OR . A paper copy in the mail If you have any lab test that is abnormal or we need to change your treatment, we will call you to review the results.  Testing/Procedures: Your physician has requested that you have an echocardiogram. Echocardiography is a painless test that uses sound waves to create images of your heart. It provides your doctor with information about the size and shape of your heart and how well your heart's chambers and valves are working. This procedure takes approximately one hour. There are no restrictions for this procedure. LOCATION: [address], [place], [place] [address]  TO Gwendal[facility] Ege FOR October 2020. YOU WILL BE CONTACTED BY A SCHEDULER TO SET UP THIS APPOINTMENT.  Your physician has requested that you have a carotid duplex. This test is an ultrasound of the carotid arteries in your neck. It looks at blood flow through these arteries that supply the brain with blood. Allow one hour for this exam. There are no restrictions or special instructions.  YOU WILL BE CONTACTED BY A SCHEDULER TO SET UP THIS APPOINTMENT.   Follow-Up: At [facility], you and your health needs are our priority.  As part of our continuing mission to provide you with exceptional heart care, we have created designated Provider Care Teams.  These Care Teams include your primary Cardiologist (physician) and Advanced Practice Providers (APPs -  Physician Assistants and Nurse Practitioners) who all work together to provide you with the  care you need, when you need it. You will need a follow up appointment in 12 months WITH DR. Gwenlyn Found.  Please call our office 2 months in advance to schedule this appointment.

## 2018-12-06 NOTE — Telephone Encounter (Signed)
Patient and/or DPR-approved person aware of AVS instructions and verbalized understanding. Letter including After Visit Summary and any other necessary documents to be mailed to the patient's address on file.  

## 2018-12-24 ENCOUNTER — Telehealth: Payer: Self-pay

## 2018-12-24 DIAGNOSIS — I200 Unstable angina: Secondary | ICD-10-CM

## 2018-12-24 NOTE — Telephone Encounter (Signed)
lmom for prescreen  

## 2018-12-24 NOTE — Telephone Encounter (Signed)

## 2018-12-25 NOTE — Addendum Note (Signed)
Addended by: Allean Found on: 12/25/2018 09:41 AM   Modules accepted: Orders

## 2018-12-26 ENCOUNTER — Telehealth: Payer: Self-pay | Admitting: Cardiovascular Disease

## 2018-12-26 ENCOUNTER — Other Ambulatory Visit: Payer: Self-pay

## 2018-12-26 ENCOUNTER — Ambulatory Visit (INDEPENDENT_AMBULATORY_CARE_PROVIDER_SITE_OTHER): Payer: Medicare Other | Admitting: Pharmacist Clinician (PhC)/ Clinical Pharmacy Specialist

## 2018-12-26 DIAGNOSIS — Z952 Presence of prosthetic heart valve: Secondary | ICD-10-CM

## 2018-12-26 DIAGNOSIS — Z86711 Personal history of pulmonary embolism: Secondary | ICD-10-CM | POA: Diagnosis not present

## 2018-12-26 DIAGNOSIS — Z7901 Long term (current) use of anticoagulants: Secondary | ICD-10-CM

## 2018-12-26 DIAGNOSIS — I200 Unstable angina: Secondary | ICD-10-CM

## 2018-12-26 LAB — CBC
Hematocrit: 43.5 % (ref 37.5–51.0)
Hemoglobin: 15.2 g/dL (ref 13.0–17.7)
MCH: 33.4 pg — ABNORMAL HIGH (ref 26.6–33.0)
MCHC: 34.9 g/dL (ref 31.5–35.7)
MCV: 96 fL (ref 79–97)
Platelets: 157 10*3/uL (ref 150–450)
RBC: 4.55 x10E6/uL (ref 4.14–5.80)
RDW: 12.8 % (ref 11.6–15.4)
WBC: 5 10*3/uL (ref 3.4–10.8)

## 2018-12-26 LAB — POCT INR: INR: 2.2 (ref 2.0–3.0)

## 2018-12-26 NOTE — Telephone Encounter (Signed)
Spoke with pt. He report for a couple of months he has been feeling weak and tired. He state sometimes he become lighted headed and dizzy. He state he doesn't check is BP often and today BP was 141/75 HR 93. Pt would like to make an appointment to be seen. Will route to MD to see if he recommend virtual or in office visit.

## 2018-12-26 NOTE — Telephone Encounter (Signed)
Patient wanted to know if he could get an appt with Dr. Gwenlyn Found sometime soon.  He had labs drawn today, and he said his blood was coming out slower than normal. He also mentioned overall fatigue recently. He has meant to call sooner, but was trying to wait until COVID slowed down.   He just wants to get checked out

## 2018-12-27 NOTE — Telephone Encounter (Signed)
He can see me back in the office next month.

## 2018-12-30 ENCOUNTER — Other Ambulatory Visit: Payer: Self-pay

## 2018-12-30 ENCOUNTER — Ambulatory Visit (INDEPENDENT_AMBULATORY_CARE_PROVIDER_SITE_OTHER): Payer: Medicare Other | Admitting: Adult Health

## 2018-12-30 DIAGNOSIS — Z0000 Encounter for general adult medical examination without abnormal findings: Secondary | ICD-10-CM | POA: Diagnosis not present

## 2018-12-30 DIAGNOSIS — I10 Essential (primary) hypertension: Secondary | ICD-10-CM | POA: Diagnosis not present

## 2018-12-30 DIAGNOSIS — R5383 Other fatigue: Secondary | ICD-10-CM | POA: Diagnosis not present

## 2018-12-30 DIAGNOSIS — R739 Hyperglycemia, unspecified: Secondary | ICD-10-CM

## 2018-12-30 DIAGNOSIS — E785 Hyperlipidemia, unspecified: Secondary | ICD-10-CM

## 2018-12-30 DIAGNOSIS — R531 Weakness: Secondary | ICD-10-CM | POA: Insufficient documentation

## 2018-12-30 NOTE — Assessment & Plan Note (Signed)
Assessment and Plan: Continue all medications as directed. Increase water intake, urine should be light yellow Drink one boost per day Exercise 10-1 minutes/day- walking, seated exercise, light weight lifting (2/3 lb weights). Labs ASAP CMP, A1c, Lipid Panel, Thyroid Panel, B12 level, Vit D level  Follow Up Instructions: Fasting labs app ASAP   I discussed the assessment and treatment plan with the patient. The patient was provided an opportunity to ask questions and all were answered. The patient agreed with the plan and demonstrated an understanding of the instructions.   The patient was advised to call back or seek an in-person evaluation if the symptoms worsen or if the condition fails to improve as anticipated.

## 2018-12-30 NOTE — Progress Notes (Signed)
Virtual Visit via Telephone Note  I connected with Curtis Brennan on 12/30/18 at  3:15 PM EDT by telephone and verified that I am speaking with the correct person using two identifiers.  Location: Patient: Home Provider: In Clinic   I discussed the limitations, risks, security and privacy concerns of performing an evaluation and management service by telephone and the availability of in person appointments. I also discussed with the patient that there may be a patient responsible charge related to this service. The patient expressed understanding and agreed to proceed.   History of Present Illness: Curtis Brennan calls in today with complaints of weakness and decreased energy that has steadily been worsening since end of Nov 2019. He denies acute change in health around that time period. He reports restful sleep 8-10 hrs/night. He reports one short nap day for 10-15 mins He estimates to drink 2-3 "glasses of water" day. He follows a vegetarian diet, primary source of protein derived from eggs, cheese, beans. He also reports intermittent dizziness, however denies this is related to position changes He reports clear nasal drainage, denies known hx of vertigo He has been on his BB for years, since his AVR 2013, so fatigue likely not r/t to BB therapy He reports being able to perform ADLs and IADLs without difficulty He denies any formal/regular exercise He underwent successful treatment of prostate ca in 1999 (surgery followed by radiation). His mother had brain ca He denies any tick bites  Wt today 190, last wt in office 192- stable His INR has been therapeutic, he denies any unusual bleeding/bruising. He has been taking his Vit B 12 1000 mcg every other day, instead of daily  12/06/2018 TeleMedicine visit with Dr. Melynda Ripple- He is statin intolerant, LDL 104 BP elevated, no change to Lisinopril 20mg  QD or Metoprolol 25mg  BID 12/26/2018 CBC :H/H 15.2/43.5 11/19/2017  TSH 1.70 He is taking  B-12 1084mcg every other day  Patient Care Team    Relationship Specialty Notifications Start End  Mina Marble D, NP PCP - General Family Medicine  12/26/18   [name], MD PCP - Cardiology Cardiology Admissions 07/20/17   Grace Isaac, MD Consulting Physician Cardiothoracic Surgery  12/15/11   [name], MD Attending Physician Cardiology  12/15/11     Patient Active Problem List   Diagnosis Date Noted  . Other fatigue 12/30/2018  . Weakness 12/30/2018  . Hypertension 06/25/2018  . Long term (current) use of anticoagulants 05/09/2018  . Healthcare maintenance 05/07/2018  . S/P AVR (aortic valve replacement)   . Coronary artery disease involving coronary bypass graft of native heart without angina pectoris   . Hx of pulmonary embolus   . Hyperlipidemia   . TIA (transient ischemic attack) 04/28/2018  . Bilateral lower extremity edema 11/30/2017  . PAF s/p CABG/AVR 12/26/2011  . Unstable angina ([place]) 12/26/2011  . COPD by CXR 12/16/2011  . Hx of CABG 12/15/2011  . AS, s/p tissue AVR 12/20/11 12/15/2011  . Chronic anticoagulation, on coumadin for pul emboli 12/15/2011  . Carotid artery disease, bilateral, asymptomatic 12/15/2011  . Dyslipidemia 10/28/2008  . Benign essential HTN 10/28/2008  . History of prior pulm embolism 10/28/2008  . PULMONARY HYPERTENSION 10/28/2008     Past Medical History:  Diagnosis Date  . Angina   . AS (aortic stenosis) moderate to severe by cath 12/14/11 for AVR  12/15/2011  . CAD (coronary artery disease) with history of LAD & Diag athrectomy in 1994, now with severe 3 vesseal CAD  by cath 12/14/11 for CABG 12/15/2011  . Carotid artery disease, bilateral 12/15/2011  . Chronic anticoagulation, on coumadin for pul emboli 12/15/2011  . Complication of anesthesia 07/1997   "just wouldn't wake wakeup"  . Coronary artery disease   . Exertional dyspnea   . Heart murmur   . Hypertension   . Kidney stones   . Legionnaire's disease ([place]) 1986   . Pneumonia    "once"  . PONV (postoperative nausea and vomiting)   . Prostate cancer ([place])   . Pulmonary embolism ([place]) ~ 2011   "both lungs"  . Unstable angina ([place]) 12/15/2011     Past Surgical History:  Procedure Laterality Date  . AORTIC VALVE REPLACEMENT  12/20/2011   Procedure: AORTIC VALVE REPLACEMENT (AVR);  Surgeon: Grace Isaac, MD;  Location: [place];  Service: Open Heart Surgery;  Laterality: N/A;  . CARDIAC CATHETERIZATION  12/14/11  . CHOLECYSTECTOMY  2001  . CORONARY ANGIOPLASTY  1994   "roto rooter"  . CORONARY ARTERY BYPASS GRAFT  12/20/2011   Procedure: CORONARY ARTERY BYPASS GRAFTING (CABG);  Surgeon: Grace Isaac, MD;  Location: [place];  Service: Open Heart Surgery;  Laterality: N/A;  . CYSTOSCOPY  12/20/2011   Procedure: CYSTOSCOPY;  Surgeon: Fredricka Bonine, MD;  Location: [place];  Service: Urology;  Laterality: N/A;  . gallstone removed  ~ 2010   "was lodged below pancreatis"  . LEFT HEART CATHETERIZATION WITH CORONARY ANGIOGRAM N/A 12/14/2011   Procedure: LEFT HEART CATHETERIZATION WITH CORONARY ANGIOGRAM;  Surgeon: [name], MD;  Location: [facility] CATH LAB;  Service: Cardiovascular;  Laterality: N/A;  . LITHOTRIPSY  2006  . MASTOIDECTOMY Left   . PROSTATE SURGERY  07/1997  . RIGHT HEART CATHETERIZATION  12/14/2011   Procedure: RIGHT HEART CATH;  Surgeon: [name], MD;  Location: [facility] CATH LAB;  Service: Cardiovascular;;  . TYMPANIC MEMBRANE REPAIR Right      Family History  Problem Relation Age of Onset  . Cancer Mother        brain  . Cancer Sister        breast and pancreatic  . Alcohol abuse Brother   . Hypertension Brother   . Stroke Brother      Social History   Substance and Sexual Activity  Drug Use No     Social History   Substance and Sexual Activity  Alcohol Use No     Social History   Tobacco Use  Smoking Status Never Smoker  Smokeless Tobacco Never Used     Outpatient Encounter Medications as  of 12/30/2018  Medication Sig Note  . aspirin EC 81 MG tablet Take 81 mg by mouth at bedtime.   [name] lisinopril (PRINIVIL,ZESTRIL) 20 MG tablet TAKE 1 TABLET BY MOUTH DAILY   . metoprolol tartrate (LOPRESSOR) 25 MG tablet Take 1 tablet (25 mg total) by mouth 2 (two) times daily.   . vitamin B-12 (CYANOCOBALAMIN) 1000 MCG tablet Take 1,000 mcg by mouth daily. Pt reports he is taking every other day   . warfarin (COUMADIN) 5 MG tablet TAKE 1/2 TO 1 TABLET BY MOUTH DAILY AS DIRECTED BY COUMADIN CLINIC 12/30/2018: Per pt- 5mg  5 days a week, 2.5mg  2 days a week   No facility-administered encounter medications on file as of 12/30/2018.     Allergies: Atorvastatin; Sulfa antibiotics; and Sulfonamide derivatives  There is no height or weight on file to calculate BMI.  There were no vitals taken for this visit. Review of Systems:  General:   No F/C, wt loss, Increase in fatigue + Pulm:   No DIB, SOB, pleuritic chest pain Card:  No CP, palpitations Abd:  No n/v/d or pain Ext:  No inc edema from baseline  COVID-19 Education: Signs and symptoms of COVID-19 infection were discussed with pt and how to seek care for testing.  The importance of following the Stay at Home order, and when out- Social Distancing and wearing a facial mask were discussed today. Observations/Objective: No acute distress noted during the telephone conversations  Assessment and Plan: Continue all medications as directed. Increase water intake, urine should be light yellow Drink one boost per day Exercise 10-1 minutes/day- walking, seated exercise, light weight lifting (2/3 lb weights). Labs ASAP CMP, A1c, Lipid Panel, Thyroid Panel, B12 level, Vit D level  Follow Up Instructions: Fasting labs app ASAP   I discussed the assessment and treatment plan with the patient. The patient was provided an opportunity to ask questions and all were answered. The patient agreed with the plan and demonstrated an understanding of the  instructions.   The patient was advised to call back or seek an in-person evaluation if the symptoms worsen or if the condition fails to improve as anticipated.  I provided 35 minutes of non-face-to-face time during this encounter.   Esaw Grandchild, NP

## 2019-01-06 ENCOUNTER — Ambulatory Visit: Payer: Medicare Other | Admitting: Adult Health

## 2019-01-07 ENCOUNTER — Other Ambulatory Visit: Payer: Medicare Other

## 2019-01-07 ENCOUNTER — Other Ambulatory Visit: Payer: Self-pay

## 2019-01-07 DIAGNOSIS — R531 Weakness: Secondary | ICD-10-CM

## 2019-01-07 DIAGNOSIS — E785 Hyperlipidemia, unspecified: Secondary | ICD-10-CM

## 2019-01-07 DIAGNOSIS — R5383 Other fatigue: Secondary | ICD-10-CM

## 2019-01-07 DIAGNOSIS — R739 Hyperglycemia, unspecified: Secondary | ICD-10-CM

## 2019-01-07 DIAGNOSIS — Z0000 Encounter for general adult medical examination without abnormal findings: Secondary | ICD-10-CM

## 2019-01-07 DIAGNOSIS — I10 Essential (primary) hypertension: Secondary | ICD-10-CM

## 2019-01-09 LAB — LIPID PANEL
Chol/HDL Ratio: 3.5 ratio (ref 0.0–5.0)
Cholesterol, Total: 163 mg/dL (ref 100–199)
HDL: 47 mg/dL (ref >39)
LDL Calculated: 96 mg/dL (ref 0–99)
Triglycerides: 102 mg/dL (ref 0–149)
VLDL Cholesterol Cal: 20 mg/dL (ref 5–40)

## 2019-01-09 LAB — COMPREHENSIVE METABOLIC PANEL
ALT: 19 IU/L (ref 0–44)
AST: 25 IU/L (ref 0–40)
Albumin/Globulin Ratio: 2.2 (ref 1.2–2.2)
Albumin: 4.4 g/dL (ref 3.6–4.6)
Alkaline Phosphatase: 52 IU/L (ref 39–117)
BUN/Creatinine Ratio: 17 (ref 10–24)
BUN: 20 mg/dL (ref 8–27)
Bilirubin Total: 1.3 mg/dL — ABNORMAL HIGH (ref 0.0–1.2)
CO2: 28 mmol/L (ref 20–29)
Calcium: 9.8 mg/dL (ref 8.6–10.2)
Chloride: 99 mmol/L (ref 96–106)
Creatinine, Ser: 1.2 mg/dL (ref 0.76–1.27)
GFR calc Af Amer: 64 mL/min/{1.73_m2} (ref >59)
GFR calc non Af Amer: 55 mL/min/{1.73_m2} — ABNORMAL LOW (ref >59)
Globulin, Total: 2 g/dL (ref 1.5–4.5)
Glucose: 126 mg/dL — ABNORMAL HIGH (ref 65–99)
Potassium: 5.1 mmol/L (ref 3.5–5.2)
Sodium: 139 mmol/L (ref 134–144)
Total Protein: 6.4 g/dL (ref 6.0–8.5)

## 2019-01-09 LAB — VITAMIN B12: Vitamin B-12: 1475 pg/mL — ABNORMAL HIGH (ref 232–1245)

## 2019-01-09 LAB — THYROID PANEL WITH TSH
Free Thyroxine Index: 2.8 (ref 1.2–4.9)
T3 Uptake Ratio: 33 % (ref 24–39)
T4, Total: 8.6 ug/dL (ref 4.5–12.0)
TSH: 2.14 u[IU]/mL (ref 0.450–4.500)

## 2019-01-09 LAB — HEMOGLOBIN A1C
Est. average glucose Bld gHb Est-mCnc: 134 mg/dL
Hgb A1c MFr Bld: 6.3 % — ABNORMAL HIGH (ref 4.8–5.6)

## 2019-01-09 LAB — VITAMIN D 25 HYDROXY (VIT D DEFICIENCY, FRACTURES): Vit D, 25-Hydroxy: 33.6 ng/mL (ref 30.0–100.0)

## 2019-01-13 ENCOUNTER — Telehealth: Payer: Self-pay | Admitting: Adult Health

## 2019-01-13 NOTE — Telephone Encounter (Signed)
Pt's daughter informed.  Ms. Curtis Brennan expressed understanding and is agreeable.  Daughter transferred to front desk to schedule WebEx.  [name], CMA

## 2019-01-13 NOTE — Telephone Encounter (Signed)
Good Afternoon [name], Can you have him schedule WebEx appt tomorrow or in office appt tomorrow? If he develops CP/dyspnea/palpitations/change in mental status before then - immediate evaluation at nearest ED. Thanks! Valetta Fuller

## 2019-01-13 NOTE — Progress Notes (Addendum)
Virtual Visit via Telephone Note  I connected with Curtis Brennan on 01/14/19 at 10:30 AM EDT by telephone and verified that I am speaking with the correct person using two identifiers.  Location: Patient: Home Provider: In Clinic   I discussed the limitations, risks, security and privacy concerns of performing an evaluation and management service by telephone and the availability of in person appointments. I also discussed with the patient that there may be a patient responsible charge related to this service. The patient expressed understanding and agreed to proceed.   History of Present Illness: [name] calls in with concerns with increased fatigue since his hospitalization IN Oct 2019. He reports CP with exertion, specifically when walking more than 10 minutes. He also states " I feel just like I did prior to my heart surgery". He denies CP at rest. He reports sleeping between 8-12 hrs at night, not an increase from his usual. He reports napping 2-3 times/day, 10 mins in duration. He recently stopped his B-12 oral supplements two weeks ago. He denies dizziness/palpiations. BP today- 145/71, HR 72 He was has been on BB for years He reports HR 70-80s consisntently He denies increase in lower extremity swelling. He denies unusual bleeding or bruising. He reports stable weight since Dec 2019- in low to mid 190s Current wt 192 He denies change in memory/slurred speech/change in vision/difficutly with ambulation. Pt's daughter "Curtis Brennan" also on the phone conversation.  Reviewed recent 01/07/2019 Labs- Thyroid Panel- stable  Vit B-12- elevated, he needs to reduce oral B-12 supplmentation  Vit d- low normal, 33.6  A1c-stable, 6.3  CMP-stable, kidney fx improved  Lipid Panel-  Improved from 8 months ago  Tot 163  TGs 102  HDL 47  LDL 96   Patient Care Team    Relationship Specialty Notifications Start End  Mina Marble D, NP PCP - General Family Medicine  12/26/18   [name], MD PCP - Cardiology Cardiology Admissions 07/20/17   Grace Isaac, MD Consulting Physician Cardiothoracic Surgery  12/15/11   [name], MD Attending Physician Cardiology  12/15/11     Patient Active Problem List   Diagnosis Date Noted  . Other fatigue 12/30/2018  . Weakness 12/30/2018  . Hypertension 06/25/2018  . Long term (current) use of anticoagulants 05/09/2018  . Healthcare maintenance 05/07/2018  . S/P AVR (aortic valve replacement)   . Coronary artery disease involving coronary bypass graft of native heart without angina pectoris   . Hx of pulmonary embolus   . Hyperlipidemia   . TIA (transient ischemic attack) 04/28/2018  . Bilateral lower extremity edema 11/30/2017  . PAF s/p CABG/AVR 12/26/2011  . Unstable angina ([place]) 12/26/2011  . COPD by CXR 12/16/2011  . Hx of CABG 12/15/2011  . AS, s/p tissue AVR 12/20/11 12/15/2011  . Chronic anticoagulation, on coumadin for pul emboli 12/15/2011  . Carotid artery disease, bilateral, asymptomatic 12/15/2011  . Dyslipidemia 10/28/2008  . Benign essential HTN 10/28/2008  . History of prior pulm embolism 10/28/2008  . PULMONARY HYPERTENSION 10/28/2008     Past Medical History:  Diagnosis Date  . Angina   . AS (aortic stenosis) moderate to severe by cath 12/14/11 for AVR  12/15/2011  . CAD (coronary artery disease) with history of LAD & Diag athrectomy in 1994, now with severe 3 vesseal CAD by cath 12/14/11 for CABG 12/15/2011  . Carotid artery disease, bilateral 12/15/2011  . Chronic anticoagulation, on coumadin for pul emboli 12/15/2011  . Complication of anesthesia  07/1997   "just wouldn't wake wakeup"  . Coronary artery disease   . Exertional dyspnea   . Heart murmur   . Hypertension   . Kidney stones   . Legionnaire's disease ([place]) 1986  . Pneumonia    "once"  . PONV (postoperative nausea and vomiting)   . Prostate cancer ([place])   . Pulmonary embolism ([place]) ~ 2011   "both lungs"  . Unstable angina  ([place]) 12/15/2011     Past Surgical History:  Procedure Laterality Date  . AORTIC VALVE REPLACEMENT  12/20/2011   Procedure: AORTIC VALVE REPLACEMENT (AVR);  Surgeon: Grace Isaac, MD;  Location: [place];  Service: Open Heart Surgery;  Laterality: N/A;  . CARDIAC CATHETERIZATION  12/14/11  . CHOLECYSTECTOMY  2001  . CORONARY ANGIOPLASTY  1994   "roto rooter"  . CORONARY ARTERY BYPASS GRAFT  12/20/2011   Procedure: CORONARY ARTERY BYPASS GRAFTING (CABG);  Surgeon: Grace Isaac, MD;  Location: [place];  Service: Open Heart Surgery;  Laterality: N/A;  . CYSTOSCOPY  12/20/2011   Procedure: CYSTOSCOPY;  Surgeon: Fredricka Bonine, MD;  Location: [place];  Service: Urology;  Laterality: N/A;  . gallstone removed  ~ 2010   "was lodged below pancreatis"  . LEFT HEART CATHETERIZATION WITH CORONARY ANGIOGRAM N/A 12/14/2011   Procedure: LEFT HEART CATHETERIZATION WITH CORONARY ANGIOGRAM;  Surgeon: [name], MD;  Location: [facility] CATH LAB;  Service: Cardiovascular;  Laterality: N/A;  . LITHOTRIPSY  2006  . MASTOIDECTOMY Left   . PROSTATE SURGERY  07/1997  . RIGHT HEART CATHETERIZATION  12/14/2011   Procedure: RIGHT HEART CATH;  Surgeon: [name], MD;  Location: [facility] C[facility] LAB;  Service: Cardiovascular;;  . TYMPANIC MEMBRANE REPAIR Right      Family History  Problem Relation Age of Onset  . Cancer Mother        brain  . Cancer Sister        breast and pancreatic  . Alcohol abuse Brother   . Hypertension Brother   . Stroke Brother      Social History   Substance and Sexual Activity  Drug Use No     Social History   Substance and Sexual Activity  Alcohol Use No     Social History   Tobacco Use  Smoking Status Never Smoker  Smokeless Tobacco Never Used     Outpatient Encounter Medications as of 01/14/2019  Medication Sig Note  . aspirin EC 81 MG tablet Take 81 mg by mouth at bedtime.   [name] lisinopril (PRINIVIL,ZESTRIL) 20 MG tablet TAKE 1 TABLET BY MOUTH DAILY    . metoprolol tartrate (LOPRESSOR) 25 MG tablet Take 1 tablet (25 mg total) by mouth 2 (two) times daily.   [name] warfarin (COUMADIN) 5 MG tablet TAKE 1/2 TO 1 TABLET BY MOUTH DAILY AS DIRECTED BY COUMADIN CLINIC 12/30/2018: Per pt- 5mg  5 days a week, 2.5mg  2 days a week  . [DISCONTINUED] vitamin B-12 (CYANOCOBALAMIN) 1000 MCG tablet Take 1,000 mcg by mouth once a week. Pt reports he is taking every other day    No facility-administered encounter medications on file as of 01/14/2019.   Review of Systems: General:   Denies fever, chills, unexplained weight loss, Fatigue +.  Optho/Auditory:   Denies visual changes, blurred vision/LOV Respiratory:   Denies SOB, DOE more than baseline levels.  Cardiovascular: chest pain with exertion+, palpitations, new onset peripheral edema  Gastrointestinal:   Denies nausea, vomiting, diarrhea.  Genitourinary: Denies dysuria, freq/ urgency,  flank pain or discharge from genitals.  Endocrine:     Denies hot or cold intolerance, polyuria, polydipsia. Musculoskeletal:   Denies unexplained myalgias, joint swelling, unexplained arthralgias, gait problems.  Skin:  Denies rash, suspicious lesions Neurological:     Denies dizziness, unexplained weakness, numbness  Psychiatric/Behavioral:   Denies mood changes, suicidal or homicidal ideations, hallucinations This patient does not have sx concerning for COVID-19 Infection (ie; fever, chills, cough, new or worsening shortness of breath).    Allergies: Atorvastatin, Sulfa antibiotics, and Sulfonamide derivatives  Body mass index is 28.35 kg/m.  Blood pressure (!) 145/71, pulse 72, height 5\' 9"  (1.753 m), weight 192 lb (87.1 kg).  Observations/Objective: No acute distress noted during the telephone conversation. Mental status was intact and at baseline.  Assessment and Plan: Continue all medications as directed. Start once daily OTC Vit D3- 2,000 IU Re-start oral B-12 supplement- take once weekly Remain well  hydrated and continue once daily boost. Do not over-exert yourself until you are evaluated by Cards. Discussed at length Red Flag cardiac/neurological sx's and to seek immediate medical assistance if any develop- pt and pt's daughter both communicated understanding and agreement. Recommend converting cards appt next week from TeleMedicine to Face-to-Face and also call to see if he can be seen sooner. Continue to social distance and when out in public, wear a mask. Follow Up Instructions: PRN Re-schedule TeleMedicine Medicare Wellness appt for later this summer    I discussed the assessment and treatment plan with the patient. The patient was provided an opportunity to ask questions and all were answered. The patient agreed with the plan and demonstrated an understanding of the instructions.   The patient was advised to call back or seek an in-person evaluation if the symptoms worsen or if the condition fails to improve as anticipated.  I provided 28 minutes of non-face-to-face time during this encounter.   Esaw Grandchild, NP

## 2019-01-13 NOTE — Telephone Encounter (Signed)
Pt's daughter, Pixie Casino, states that pt is feeling very weak, resting a lot, but not necessarily sleeping.  Pt's daughter states that pt is concerned about how he is feeling, but does not want to go to the hospital d/t concerns with COVID.  She has called his cardiology office but they cannot see him until 01/22/2019.  He denies chest pains or SOB. Please advise how they should proceed.  [name], CMA

## 2019-01-13 NOTE — Telephone Encounter (Signed)
Patient's daughter Pixie Casino called and left a VM requesting to speak with clinic staff about her father. She did not specify any details on what it was concerning, but she can be reached at [phone] (DPR is on file to speak with her)

## 2019-01-14 ENCOUNTER — Ambulatory Visit (INDEPENDENT_AMBULATORY_CARE_PROVIDER_SITE_OTHER): Payer: Medicare Other | Admitting: Adult Health

## 2019-01-14 ENCOUNTER — Other Ambulatory Visit: Payer: Self-pay

## 2019-01-14 ENCOUNTER — Encounter: Payer: Self-pay | Admitting: Adult Health

## 2019-01-14 DIAGNOSIS — R5383 Other fatigue: Secondary | ICD-10-CM | POA: Diagnosis not present

## 2019-01-14 NOTE — Assessment & Plan Note (Addendum)
Assessment and Plan: Continue all medications as directed. Start once daily OTC Vit D3- 2,000 IU Re-start oral B-12 supplement- take once weekly Remain well hydrated and continue once daily boost. Do not over-exert yourself until you are evaluated by Cards. Discussed at length Red Flag cardiac/neurological sx's and to seek immediate medical assistance if any develop- pt and pt's daughter both communicated understanding and agreement. Recommend converting cards appt next week from TeleMedicine to Face-to-Face and also call to see if he can be seen sooner. Continue to social distance and when out in public, wear a mask. Follow Up Instructions: PRN Re-schedule TeleMedicine Medicare Wellness appt for later this summer    I discussed the assessment and treatment plan with the patient. The patient was provided an opportunity to ask questions and all were answered. The patient agreed with the plan and demonstrated an understanding of the instructions.   The patient was advised to call back or seek an in-person evaluation if the symptoms worsen or if the condition fails to improve as anticipated.

## 2019-01-16 NOTE — Telephone Encounter (Signed)
F/U Message        Patient daughter is calling [phone], she states that her father can not hear well and she would like to know if someone can come in with him at his appointment so they can listen for him? Pls call to advise

## 2019-01-16 NOTE — Telephone Encounter (Signed)
Will forward to Sharyn Lull B LPN with Riso Long DNP who appointment is with me next we

## 2019-01-20 NOTE — Telephone Encounter (Signed)
Call and spoke with pt daughter Bethena Roys, to let her know someone can come back to the room with pt at his appt, pt daughter voice understanding and thanks.

## 2019-01-21 NOTE — Progress Notes (Signed)
Cardiology Office Note   Date:  01/22/2019   ID:  Curtis Brennan, DOB 06-20-1932, MRN [id number]  PCP:  Esaw Grandchild, NP  Cardiologist:  Dr. Gwenlyn Found Fatigue   History of Present Illness: Curtis Brennan is a 85 y.o. male who presents for ongoing assessment and management of coronary artery disease, with history of CABG x3 in May 2013 with LIMA to LAD, vein to circumflex and to PDA as well as tissue AVR for aortic stenosis.  Other history includes hypertension, hyperlipidemia (intolerant to statin drugs), remote history of pulmonary emboli on Coumadin for anticoagulation.  The patient also has a history of asymmetric LICA stenosis which is being followed duplex ultrasound.  Most recent echocardiogram in October 2019 revealed a well-functioning aortic bioprosthesis valve with normal LV function and normal Myoview stress test.  The patient was last seen in the office by Dr. Gwenlyn Found on 12/06/2018 and was doing well, he was adhering to social distancing and staying home as much as possible in the setting of pandemic.  He was asymptomatic for chest pain or dyspnea.  Blood pressure was well controlled.  He was noted to have an elevated LDL of 104 total cholesterol 153 and HDL of 31, intolerant of statins.   Today he states he has had fatigue since 04/2018.  He notices his dyspnea more with exertion and also states that he develops chest pain with his exertion.  He has elevated LDL cholesterol and was offered a consultation with the lipid clinic which he refused.  A cardiac Myoview perfusion study was also recommended which he stated he did not want to do as well.  He denied  palpitations, lower extremity edema, melena, hemoptysis, hematuria, presyncope, syncope, orthopnea, and PND.    Past Medical History:  Diagnosis Date  . Angina   . AS (aortic stenosis) moderate to severe by cath 12/14/11 for AVR  12/15/2011  . CAD (coronary artery disease) with history of LAD & Diag athrectomy in 1994, now with severe 3  vesseal CAD by cath 12/14/11 for CABG 12/15/2011  . Carotid artery disease, bilateral 12/15/2011  . Chronic anticoagulation, on coumadin for pul emboli 12/15/2011  . Complication of anesthesia 07/1997   "just wouldn't wake wakeup"  . Coronary artery disease   . Exertional dyspnea   . Heart murmur   . Hypertension   . Kidney stones   . Legionnaire's disease ([place]) 1986  . Pneumonia    "once"  . PONV (postoperative nausea and vomiting)   . Prostate cancer ([place])   . Pulmonary embolism ([place]) ~ 2011   "both lungs"  . Unstable angina ([place]) 12/15/2011    Past Surgical History:  Procedure Laterality Date  . AORTIC VALVE REPLACEMENT  12/20/2011   Procedure: AORTIC VALVE REPLACEMENT (AVR);  Surgeon: Grace Isaac, MD;  Location: [place];  Service: Open Heart Surgery;  Laterality: N/A;  . CARDIAC CATHETERIZATION  12/14/11  . CHOLECYSTECTOMY  2001  . CORONARY ANGIOPLASTY  1994   "roto rooter"  . CORONARY ARTERY BYPASS GRAFT  12/20/2011   Procedure: CORONARY ARTERY BYPASS GRAFTING (CABG);  Surgeon: Grace Isaac, MD;  Location: [place];  Service: Open Heart Surgery;  Laterality: N/A;  . CYSTOSCOPY  12/20/2011   Procedure: CYSTOSCOPY;  Surgeon: Fredricka Bonine, MD;  Location: [place];  Service: Urology;  Laterality: N/A;  . gallstone removed  ~ 2010   "was lodged below pancreatis"  . LEFT HEART CATHETERIZATION WITH CORONARY ANGIOGRAM N/A 12/14/2011   Procedure: LEFT HEART CATHETERIZATION  WITH CORONARY ANGIOGRAM;  Surgeon: [name], MD;  Location[facility] CATH LAB;  Service: Cardiovascular;  Laterality: N/A;  . LITHOTRIPSY  2006  . MASTOIDECTOMY Left   . PROSTATE SURGERY  07/1997  . RIGHT HEART CATHETERIZATION  12/14/2011   Procedure: RIGHT HEART CATH;  Surgeon: [name], MD;  Location: [facility] CATH LAB;  Service: Cardiovascular;;  . TYMPANIC MEMBRANE REPAIR Right      Current Outpatient Medications  Medication Sig Dispense Refill  . aspirin EC 81 MG tablet Take 81 mg by mouth at  bedtime.    . Cholecalciferol (VITAMIN D) 50 MCG (2000 UT) CAPS Take 1 capsule by mouth daily.    . Cyanocobalamin (VITAMIN B12 PO) Take 1 tablet by mouth once a week.    [name] lisinopril (PRINIVIL,ZESTRIL) 20 MG tablet TAKE 1 TABLET BY MOUTH DAILY 90 tablet 1  . metoprolol tartrate (LOPRESSOR) 25 MG tablet Take 1 tablet (25 mg total) by mouth 2 (two) times daily. 180 tablet 0  . warfarin (COUMADIN) 5 MG tablet TAKE 1/2 TO 1 TABLET BY MOUTH DAILY AS DIRECTED BY COUMADIN CLINIC 30 tablet 1  . isosorbide mononitrate (IMDUR) 30 MG 24 hr tablet Take 0.5 tablets (15 mg total) by mouth daily. 15 tablet 11   No current facility-administered medications for this visit.     Allergies:   Atorvastatin, Sulfa antibiotics, and Sulfonamide derivatives    Social History:  The patient  reports that he has never smoked. He has never used smokeless tobacco. He reports that he does not drink alcohol or use drugs.   Family History:  The patient's family history includes Alcohol abuse in his brother; Cancer in his mother and sister; Hypertension in his brother; Stroke in his brother.    ROS: All other systems are reviewed and negative. Unless otherwise mentioned in H&P    PHYSICAL EXAM: VS:  BP (!) 142/74   Pulse 75   Temp 98.6 F (37 C) (Temporal)   Ht 5\' 9"  (1.753 m)   Wt 189 lb 8 oz (86 kg)   SpO2 98%   BMI 27.98 kg/m  , BMI Body mass index is 27.98 kg/m. GEN: Well nourished, well developed, in no acute distress HEENT: normal Neck: no JVD, carotid bruits, or masses Cardiac:RRR; no murmurs, rubs, or gallops,no edema  Respiratory:  Clear to auscultation bilaterally, normal work of breathing GI: soft, nontender, nondistended, + BS MS: no deformity or atrophy Skin: warm and dry, no rash Neuro:  Strength and sensation are intact Psych: euthymic mood, full affect   EKG:  EKG is ordered today. The ekg ordered today demonstrates sinus rhythm with occasional PVCs 75 bpm.  No acute changes.   Recent  Labs: 12/26/2018: Hemoglobin 15.2; Platelets 157 01/07/2019: ALT 19; BUN 20; Creatinine, Ser 1.20; Potassium 5.1; Sodium 139; TSH 2.140    Lipid Panel    Component Value Date/Time   CHOL 163 01/07/2019 0948   TRIG 102 01/07/2019 0948   HDL 47 01/07/2019 0948   CHOLHDL 3.5 01/07/2019 0948   CHOLHDL 4.9 04/29/2018 0642   VLDL 18 04/29/2018 0642   LDLCALC 96 01/07/2019 0948      Wt Readings from Last 3 Encounters:  01/22/19 189 lb 8 oz (86 kg)  01/14/19 192 lb (87.1 kg)  12/06/18 192 lb (87.1 kg)      Other studies Reviewed: Additional studies/ records that were reviewed today include:  Echocardiogram 04/29/2018:  Normal LV size with mild LV hypertrophy. Vigorous systolic  function, EF 65-70%. Normal RV size and systolic function.   Bioprosthetic aortic valve with mild peri-valvular regurgitation,no significant stenosis.  EKG 04/29/2018: Sinus rhythm 72 bpm  ASSESSMENT AND PLAN:   1.  Fatigue- last 8 months along with dyspnea on exertion.  01/07/2019 CMP, CBC, TSH are all normal.  04/2018 echocardiogram normal EF. Offered nuclear stress test- patient declined  2.Dyslipidemia-04/29/2018: VLDL 18 01/07/2019: Cholesterol, Total 163; HDL 47; LDL Calculated 96; Triglycerides 102 Offered lipid clinic-patient declined Followed by PCP.  3.  Essential hypertension-slightly elevated systolic 032Z Begin taking lisinopril 20 mg tablet in the morning Low-sodium diet Increase physical activity as tolerated Continue weight loss. Keep blood pressure log and bring to next clinic visit  4.Coronary artery disease/ Stable angina- no chest pain today Begin Imdur 15 mg daily Recommended nuclear myocardial perfusion study-patient declined Offered SL nitroglycerin- patient declined Reviewed signs and symptoms of ACS Encouraged pt and daughter to follow up or call clinic if symptoms do not resolve or get worse.    Disposition: Follow-up in 3 months   Anselmo Reihl M. Bodhi Moradi NP-C    01/22/2019  4:40 PM    [facility] [place] [address] Office [phone] Fax [phone]

## 2019-01-22 ENCOUNTER — Ambulatory Visit (INDEPENDENT_AMBULATORY_CARE_PROVIDER_SITE_OTHER): Payer: Medicare Other | Admitting: Adult Health

## 2019-01-22 ENCOUNTER — Encounter: Payer: Self-pay | Admitting: Adult Health

## 2019-01-22 ENCOUNTER — Other Ambulatory Visit: Payer: Self-pay

## 2019-01-22 ENCOUNTER — Ambulatory Visit: Payer: Medicare Other | Admitting: Adult Health

## 2019-01-22 VITALS — BP 142/74 | HR 75 | Temp 98.6°F | Ht 69.0 in | Wt 189.5 lb

## 2019-01-22 DIAGNOSIS — I2510 Atherosclerotic heart disease of native coronary artery without angina pectoris: Secondary | ICD-10-CM

## 2019-01-22 DIAGNOSIS — R5383 Other fatigue: Secondary | ICD-10-CM | POA: Diagnosis not present

## 2019-01-22 DIAGNOSIS — I208 Other forms of angina pectoris: Secondary | ICD-10-CM

## 2019-01-22 DIAGNOSIS — E785 Hyperlipidemia, unspecified: Secondary | ICD-10-CM

## 2019-01-22 MED ORDER — ISOSORBIDE MONONITRATE ER 30 MG PO TB24: 15.0000 mg | ORAL_TABLET | Freq: Every day | ORAL | 11 refills | Status: DC

## 2019-01-22 NOTE — Patient Instructions (Signed)
Medication Instructions:  START- Isosorbide(Imdur) 30 mg take 1/2 tablets(15 mg) daily TAKE- Lisinopril in the morning  If you need a refill on your cardiac medications before your next appointment, please call your pharmacy.  Labwork: None Ordered   Testing/Procedures: None Ordered  Special Instructions:  Low-Sodium Eating Plan Sodium, which is an element that makes up salt, helps you maintain a healthy balance of fluids in your body. Too much sodium can increase your blood pressure and cause fluid and waste to be held in your body. Your health care provider or dietitian may recommend following this plan if you have high blood pressure (hypertension), kidney disease, liver disease, or heart failure. Eating less sodium can help lower your blood pressure, reduce swelling, and protect your heart, liver, and kidneys. What are tips for following this plan? General guidelines  Most people on this plan should limit their sodium intake to 1,500-2,000 mg (milligrams) of sodium each day. Reading food labels   The Nutrition Facts label lists the amount of sodium in one serving of the food. If you eat more than one serving, you must multiply the listed amount of sodium by the number of servings.  Choose foods with less than 140 mg of sodium per serving.  Avoid foods with 300 mg of sodium or more per serving. Shopping  Look for lower-sodium products, often labeled as "low-sodium" or "no salt added."  Always check the sodium content even if foods are labeled as "unsalted" or "no salt added".  Buy fresh foods. ? Avoid canned foods and premade or frozen meals. ? Avoid canned, cured, or processed meats  Buy breads that have less than 80 mg of sodium per slice. Cooking  Eat more home-cooked food and less restaurant, buffet, and fast food.  Avoid adding salt when cooking. Use salt-free seasonings or herbs instead of table salt or sea salt. Check with your health care provider or pharmacist  before using salt substitutes.  Cook with plant-based oils, such as canola, sunflower, or olive oil. Meal planning  When eating at a restaurant, ask that your food be prepared with less salt or no salt, if possible.  Avoid foods that contain MSG (monosodium glutamate). MSG is sometimes added to [place] food, bouillon, and some canned foods. What foods are recommended? The items listed may not be a complete list. Talk with your dietitian about what dietary choices are best for you. Grains Low-sodium cereals, including oats, puffed wheat and rice, and shredded wheat. Low-sodium crackers. Unsalted rice. Unsalted pasta. Low-sodium bread. Whole-grain breads and whole-grain pasta. Vegetables Fresh or frozen vegetables. "No salt added" canned vegetables. "No salt added" tomato sauce and paste. Low-sodium or reduced-sodium tomato and vegetable juice. Fruits Fresh, frozen, or canned fruit. Fruit juice. Meats and other protein foods Fresh or frozen (no salt added) meat, poultry, seafood, and fish. Low-sodium canned tuna and salmon. Unsalted nuts. Dried peas, beans, and lentils without added salt. Unsalted canned beans. Eggs. Unsalted nut butters. Dairy Milk. Soy milk. Cheese that is naturally low in sodium, such as ricotta cheese, fresh mozzarella, or Swiss cheese Low-sodium or reduced-sodium cheese. Cream cheese. Yogurt. Fats and oils Unsalted butter. Unsalted margarine with no trans fat. Vegetable oils such as canola or olive oils. Seasonings and other foods Fresh and dried herbs and spices. Salt-free seasonings. Low-sodium mustard and ketchup. Sodium-free salad dressing. Sodium-free light mayonnaise. Fresh or refrigerated horseradish. Lemon juice. Vinegar. Homemade, reduced-sodium, or low-sodium soups. Unsalted popcorn and pretzels. Low-salt or salt-free chips. What foods are not recommended?  The items listed may not be a complete list. Talk with your dietitian about what dietary choices are best  for you. Grains Instant hot cereals. Bread stuffing, pancake, and biscuit mixes. Croutons. Seasoned rice or pasta mixes. Noodle soup cups. Boxed or frozen macaroni and cheese. Regular salted crackers. Self-rising flour. Vegetables Sauerkraut, pickled vegetables, and relishes. Olives. [place] fries. Onion rings. Regular canned vegetables (not low-sodium or reduced-sodium). Regular canned tomato sauce and paste (not low-sodium or reduced-sodium). Regular tomato and vegetable juice (not low-sodium or reduced-sodium). Frozen vegetables in sauces. Meats and other protein foods Meat or fish that is salted, canned, smoked, spiced, or pickled. Bacon, ham, sausage, hotdogs, corned beef, chipped beef, packaged lunch meats, salt pork, jerky, pickled herring, anchovies, regular canned tuna, sardines, salted nuts. Dairy Processed cheese and cheese spreads. Cheese curds. Blue cheese. Feta cheese. String cheese. Regular cottage cheese. Buttermilk. Canned milk. Fats and oils Salted butter. Regular margarine. Ghee. Bacon fat. Seasonings and other foods Onion salt, garlic salt, seasoned salt, table salt, and sea salt. Canned and packaged gravies. Worcestershire sauce. Tartar sauce. Barbecue sauce. Teriyaki sauce. Soy sauce, including reduced-sodium. Steak sauce. Fish sauce. Oyster sauce. Cocktail sauce. Horseradish that you find on the shelf. Regular ketchup and mustard. Meat flavorings and tenderizers. Bouillon cubes. Hot sauce and Tabasco sauce. Premade or packaged marinades. Premade or packaged taco seasonings. Relishes. Regular salad dressings. Salsa. Potato and tortilla chips. Corn chips and puffs. Salted popcorn and pretzels. Canned or dried soups. Pizza. Frozen entrees and pot pies. Summary  Eating less sodium can help lower your blood pressure, reduce swelling, and protect your heart, liver, and kidneys.  Most people on this plan should limit their sodium intake to 1,500-2,000 mg (milligrams) of sodium each  day.  Canned, boxed, and frozen foods are high in sodium. Restaurant foods, fast foods, and pizza are also very high in sodium. You also get sodium by adding salt to food.  Try to cook at home, eat more fresh fruits and vegetables, and eat less fast food, canned, processed, or prepared foods. This information is not intended to replace advice given to you by your health care provider. Make sure you discuss any questions you have with your health care provider. Document Released: 12/30/2001 Document Revised: 06/22/2017 Document Reviewed: 07/03/2016 Elsevier Patient Education  [place]:  Your physician recommends that you schedule a follow-up appointment in: 3 Months with Dr Gwenlyn Found   At [facility], you and your health needs are our priority.  As part of our continuing mission to provide you with exceptional heart care, we have created designated Provider Care Teams.  These Care Teams include your primary Cardiologist (physician) and Advanced Practice Providers (APPs -  Physician Assistants and Nurse Practitioners) who all work together to provide you with the care you need, when you need it.  Thank you for choosing [facility] at [facility]!!

## 2019-01-27 NOTE — Addendum Note (Signed)
Addended by: Venetia Maxon on: 01/27/2019 04:02 PM   Modules accepted: Orders

## 2019-02-18 ENCOUNTER — Other Ambulatory Visit: Payer: Self-pay | Admitting: Adult Health

## 2019-02-18 NOTE — Telephone Encounter (Signed)
Should cardiology, Dr. Gwenlyn Found, be managing this medication?  Please advise.  [name], CMA

## 2019-02-26 ENCOUNTER — Other Ambulatory Visit: Payer: Self-pay

## 2019-02-26 ENCOUNTER — Ambulatory Visit (INDEPENDENT_AMBULATORY_CARE_PROVIDER_SITE_OTHER): Payer: Medicare Other | Admitting: Pharmacist Clinician (PhC)/ Clinical Pharmacy Specialist

## 2019-02-26 DIAGNOSIS — Z952 Presence of prosthetic heart valve: Secondary | ICD-10-CM

## 2019-02-26 DIAGNOSIS — Z7901 Long term (current) use of anticoagulants: Secondary | ICD-10-CM

## 2019-02-26 DIAGNOSIS — Z86711 Personal history of pulmonary embolism: Secondary | ICD-10-CM | POA: Diagnosis not present

## 2019-02-26 LAB — POCT INR: INR: 1.6 — AB (ref 2.0–3.0)

## 2019-02-26 NOTE — Patient Instructions (Signed)
Take 1.5 tablets today Wednesday Aug 5, then increase dose to 1 tablet daily except 1/2 tablet each Sunday.  Repeat INR in 8 weeks

## 2019-04-10 ENCOUNTER — Other Ambulatory Visit: Payer: Self-pay

## 2019-04-10 ENCOUNTER — Ambulatory Visit (INDEPENDENT_AMBULATORY_CARE_PROVIDER_SITE_OTHER): Payer: Medicare Other | Admitting: Cardiovascular Disease

## 2019-04-10 ENCOUNTER — Encounter: Payer: Self-pay | Admitting: Cardiovascular Disease

## 2019-04-10 ENCOUNTER — Ambulatory Visit (INDEPENDENT_AMBULATORY_CARE_PROVIDER_SITE_OTHER): Payer: Medicare Other | Admitting: Pharmacist Clinician (PhC)/ Clinical Pharmacy Specialist

## 2019-04-10 VITALS — BP 138/74 | HR 68 | Temp 95.0°F | Ht 69.0 in | Wt 189.0 lb

## 2019-04-10 DIAGNOSIS — Z7901 Long term (current) use of anticoagulants: Secondary | ICD-10-CM

## 2019-04-10 DIAGNOSIS — I350 Nonrheumatic aortic (valve) stenosis: Secondary | ICD-10-CM

## 2019-04-10 DIAGNOSIS — Z86711 Personal history of pulmonary embolism: Secondary | ICD-10-CM

## 2019-04-10 DIAGNOSIS — I6522 Occlusion and stenosis of left carotid artery: Secondary | ICD-10-CM

## 2019-04-10 DIAGNOSIS — Z952 Presence of prosthetic heart valve: Secondary | ICD-10-CM | POA: Diagnosis not present

## 2019-04-10 LAB — POCT INR: INR: 2.7 (ref 2.0–3.0)

## 2019-04-10 NOTE — Assessment & Plan Note (Signed)
History of essential hypertension with blood pressure measured today 138/74.  He is on lisinopril and metoprolol

## 2019-04-10 NOTE — Assessment & Plan Note (Signed)
History of moderately severe left ICA stenosis by duplex ultrasound 04/28/2018.  This will be repeated next month.

## 2019-04-10 NOTE — Assessment & Plan Note (Signed)
History of tissue 8 AVR at the time of CABG 12/20/2011 with recent echo performed 04/28/2018 revealing normal LV systolic function, mild LVH and a well-functioning aortic bioprosthesis.  We will repeat his 2D echocardiogram.

## 2019-04-10 NOTE — Assessment & Plan Note (Signed)
History of CAD status post CABG times 35/29/13 by Dr. Servando Snare the LIMA toward his LAD, vein to the circumflex and PDA.  He gets occasional chest pain which does not sound cardiac.

## 2019-04-10 NOTE — Patient Instructions (Signed)
Medication Instructions: Dr Gwenlyn Found recommends that you continue on your current medications as directed. Please refer to the Current Medication list given to you today.  Testing/Procedures: 1. Your physician has requested that you have an echocardiogram. Echocardiography is a painless test that uses sound waves to create images of your heart. It provides your doctor with information about the size and shape of your heart and how well your heart's chambers and valves are working. This procedure takes approximately one hour. There are no restrictions for this procedure.  >> This will be performed at our [facility] location [place], [place] [address] [phone]  2. Your physician has requested that you have a carotid duplex. This test is an ultrasound of the carotid arteries in your neck. It looks at blood flow through these arteries that supply the brain with blood. Allow one hour for this exam. There are no restrictions or special instructions.  >>This will be performed at our [facility] location [address], [place] [address] [phone]  Follow-up: Your physician recommends that you schedule a follow-up appointment in 6 months with Coletta Memos, NP. Dr Gwenlyn Found recommends that you schedule a follow-up appointment in 12 months.  You will receive a reminder letter in the mail two months in advance for both appointments. If you don't receive a letter, please call our office to schedule the follow-up appointment.  If you need a refill on your cardiac medications before your next appointment, please call your pharmacy.

## 2019-04-10 NOTE — Assessment & Plan Note (Signed)
History of dyslipidemia intolerant to statin therapy with lipid profile performed 01/07/2019 revealing total cholesterol 163, LDL of 96 and HDL 47

## 2019-04-10 NOTE — Progress Notes (Signed)
04/10/2019 Adyn Dower   03-Aug-1931  [id number]  Primary Physician Danford, Berna Spare, NP Primary Cardiologist: [name] MD [name], [place]  HPI:  Curtis Brennan is a 85 y.o.  thin appearing married Caucasian male, father of 70, grandfather to 42 and great grandfather to 40 grandchildren who is accompanied by his wife today. I last saw  virtually by telephone 12/06/2018.[name] He has a history of coronary arterydisease status post directional coronary atherectomy by myself July 1994. He ultimately underwent coronary arterybypass grafting x35/29/13with a LIMA to his LAD, a vein to the circumflex and to the PDA as well as a tissue AVR for aortic stenosis over the summer time. His other problems include hypertension, hyperlipidemia, intolerant to statin drugs. He does have a remote history of a pulmonary emboli and has been on Coumadin anticoagulation for the last 5 years. He has known asymptomatic moderate left internal carotid artery stenosis which we are following by duplex ultrasound. He saw [name] in the office July 05, 2012 because of hypertension and his lisinopril was adjusted. He was counseled about salt intake. A 2D echo performed 10/19/14 showed a well-functioning aortic bioprosthesis with normal LV function, and a Myoview stress test was normal as well.  Since I saw him 4 months ago he did see Coletta Memos, FNP in the office complaining of some atypical chest pain.  He prescribed Imdur which he took for several days but could not tolerate.  He suggested the patient have a Myoview stress test which she declined.  Symptoms are very atypical.   No outpatient medications have been marked as taking for the 04/10/19 encounter (Office Visit) with [name], MD.     Allergies  Allergen Reactions  . Atorvastatin Other (See Comments)    myalgias  . Sulfa Antibiotics Rash    Pt does not recall a rash from any medication  . Sulfonamide Derivatives Rash    Pt does  not recall a rash from any medication    Social History   Socioeconomic History  . Marital status: Married    Spouse name: Not on file  . Number of children: Not on file  . Years of education: Not on file  . Highest education level: Not on file  Occupational History  . Not on file  Social Needs  . Financial resource strain: Not on file  . Food insecurity    Worry: Not on file    Inability: Not on file  . Transportation needs    Medical: Not on file    Non-medical: Not on file  Tobacco Use  . Smoking status: Never Smoker  . Smokeless tobacco: Never Used  Substance and Sexual Activity  . Alcohol use: No  . Drug use: No  . Sexual activity: Yes    Birth control/protection: None  Lifestyle  . Physical activity    Days per week: Not on file    Minutes per session: Not on file  . Stress: Not on file  Relationships  . Social Herbalist on phone: Not on file    Gets together: Not on file    Attends religious service: Not on file    Active member of club or organization: Not on file    Attends meetings of clubs or organizations: Not on file    Relationship status: Not on file  . Intimate partner violence    Fear of current or ex partner: Not on file  Emotionally abused: Not on file    Physically abused: Not on file    Forced sexual activity: Not on file  Other Topics Concern  . Not on file  Social History Narrative  . Not on file     Review of Systems: General: negative for chills, fever, night sweats or weight changes.  Cardiovascular: negative for chest pain, dyspnea on exertion, edema, orthopnea, palpitations, paroxysmal nocturnal dyspnea or shortness of breath Dermatological: negative for rash Respiratory: negative for cough or wheezing Urologic: negative for hematuria Abdominal: negative for nausea, vomiting, diarrhea, bright red blood per rectum, melena, or hematemesis Neurologic: negative for visual changes, syncope, or dizziness All other systems  reviewed and are otherwise negative except as noted above.    Blood pressure 138/74, pulse 68, temperature (!) 95 F (35 C), height 5\' 9"  (1.753 m), weight 189 lb (85.7 kg).  General appearance: alert and no distress Neck: no adenopathy, no JVD, supple, symmetrical, trachea midline, thyroid not enlarged, symmetric, no tenderness/mass/nodules and Soft bilateral carotid bruits Lungs: clear to auscultation bilaterally Heart: 2/6 outflow tract murmur consistent with aortic stenosis Extremities: extremities normal, atraumatic, no cyanosis or edema Pulses: 2+ and symmetric Skin: Skin color, texture, turgor normal. No rashes or lesions Neurologic: Alert and oriented X 3, normal strength and tone. Normal symmetric reflexes. Normal coordination and gait  EKG not performed today  ASSESSMENT AND PLAN:   Carotid artery disease, bilateral, asymptomatic History of moderately severe left ICA stenosis by duplex ultrasound 04/28/2018.  This will be repeated next month.  Hypertension History of essential hypertension with blood pressure measured today 138/74.  He is on lisinopril and metoprolol  Dyslipidemia History of dyslipidemia intolerant to statin therapy with lipid profile performed 01/07/2019 revealing total cholesterol 163, LDL of 96 and HDL 47  Hx of CABG History of CAD status post CABG times 35/29/13 by Dr. Servando Snare the LIMA toward his LAD, vein to the circumflex and PDA.  He gets occasional chest pain which does not sound cardiac.  AS, s/p tissue AVR 12/20/11 History of tissue 8 AVR at the time of CABG 12/20/2011 with recent echo performed 04/28/2018 revealing normal LV systolic function, mild LVH and a well-functioning aortic bioprosthesis.  We will repeat his 2D echocardiogram.      [name] MD [facility], [facility] 04/10/2019 3:06 PM

## 2019-04-21 ENCOUNTER — Other Ambulatory Visit: Payer: Self-pay | Admitting: Cardiovascular Disease

## 2019-04-22 ENCOUNTER — Other Ambulatory Visit (HOSPITAL_COMMUNITY): Payer: Self-pay | Admitting: Cardiovascular Disease

## 2019-04-22 ENCOUNTER — Other Ambulatory Visit: Payer: Self-pay

## 2019-04-22 ENCOUNTER — Ambulatory Visit (HOSPITAL_BASED_OUTPATIENT_CLINIC_OR_DEPARTMENT_OTHER): Payer: Medicare Other

## 2019-04-22 ENCOUNTER — Ambulatory Visit (HOSPITAL_COMMUNITY)
Admission: RE | Admit: 2019-04-22 | Discharge: 2019-04-22 | Disposition: A | Discharge status: Home / Self Care | Payer: Medicare Other | Source: Ambulatory Visit | Attending: Cardiology | Admitting: Cardiology

## 2019-04-22 DIAGNOSIS — I6522 Occlusion and stenosis of left carotid artery: Secondary | ICD-10-CM | POA: Diagnosis not present

## 2019-04-22 DIAGNOSIS — Z952 Presence of prosthetic heart valve: Secondary | ICD-10-CM | POA: Insufficient documentation

## 2019-04-22 DIAGNOSIS — I350 Nonrheumatic aortic (valve) stenosis: Secondary | ICD-10-CM | POA: Diagnosis not present

## 2019-04-22 DIAGNOSIS — I6523 Occlusion and stenosis of bilateral carotid arteries: Secondary | ICD-10-CM

## 2019-04-22 MED ORDER — PERFLUTREN LIPID MICROSPHERE
1.0000 mL | INTRAVENOUS | Status: AC | PRN
  Administered 2019-04-22: 1 mL via INTRAVENOUS

## 2019-04-23 ENCOUNTER — Other Ambulatory Visit: Payer: Self-pay | Admitting: *Deleted

## 2019-04-23 DIAGNOSIS — Z952 Presence of prosthetic heart valve: Secondary | ICD-10-CM

## 2019-04-23 DIAGNOSIS — I6522 Occlusion and stenosis of left carotid artery: Secondary | ICD-10-CM

## 2019-04-29 ENCOUNTER — Ambulatory Visit: Payer: Medicare Other | Admitting: Cardiovascular Disease

## 2019-05-06 ENCOUNTER — Ambulatory Visit: Payer: Medicare Other | Admitting: Adult Health

## 2019-05-06 NOTE — Progress Notes (Deleted)
Subjective:    Patient ID: Curtis Brennan, male    DOB: 10-30-1931, 85 y.o.   MRN: [id number]  HPI:  Curtis Brennan is here  He is followed by Dr. Melynda Ripple- Last OV 04/10/2019- no change to anti-hypertensive therapy 04/22/2019 -2D Echo Normal LV size and function with a normally functioning aortic bioprosthesis and mildly increased transvalvular gradients compared to prior echoes. Repeat 12 months 04/22/2019  - VAS US Carotid Duplex- no change F/u 6 months with Coletta Memos, NP. Dr Gwenlyn Found recommends that you schedule a follow-up appointment in 12 months.  Patient Care Team    Relationship Specialty Notifications Start End  Mina Marble D, NP PCP - General Family Medicine  12/26/18   [name], MD PCP - Cardiology Cardiology Admissions 07/20/17   Grace Isaac, MD Consulting Physician Cardiothoracic Surgery  12/15/11   [name], MD Attending Physician Cardiology  12/15/11     Patient Active Problem List   Diagnosis Date Noted  . Other fatigue 12/30/2018  . Weakness 12/30/2018  . Hypertension 06/25/2018  . Long term (current) use of anticoagulants 05/09/2018  . Healthcare maintenance 05/07/2018  . S/P AVR (aortic valve replacement)   . Coronary artery disease involving coronary bypass graft of native heart without angina pectoris   . Hx of pulmonary embolus   . Hyperlipidemia   . TIA (transient ischemic attack) 04/28/2018  . Bilateral lower extremity edema 11/30/2017  . PAF s/p CABG/AVR 12/26/2011  . Unstable angina ([place]) 12/26/2011  . COPD by CXR 12/16/2011  . Hx of CABG 12/15/2011  . AS, s/p tissue AVR 12/20/11 12/15/2011  . Chronic anticoagulation, on coumadin for pul emboli 12/15/2011  . Carotid artery disease, bilateral, asymptomatic 12/15/2011  . Dyslipidemia 10/28/2008  . Benign essential HTN 10/28/2008  . History of prior pulm embolism 10/28/2008  . PULMONARY HYPERTENSION 10/28/2008     Past Medical History:  Diagnosis Date  . Angina   . AS  (aortic stenosis) moderate to severe by cath 12/14/11 for AVR  12/15/2011  . CAD (coronary artery disease) with history of LAD & Diag athrectomy in 1994, now with severe 3 vesseal CAD by cath 12/14/11 for CABG 12/15/2011  . Carotid artery disease, bilateral 12/15/2011  . Chronic anticoagulation, on coumadin for pul emboli 12/15/2011  . Complication of anesthesia 07/1997   "just wouldn't wake wakeup"  . Coronary artery disease   . Exertional dyspnea   . Heart murmur   . Hypertension   . Kidney stones   . Legionnaire's disease ([place]) 1986  . Pneumonia    "once"  . PONV (postoperative nausea and vomiting)   . Prostate cancer ([place])   . Pulmonary embolism ([facility]) ~ 2011   "both lungs"  . Unstable angina ([place]) 12/15/2011     Past Surgical History:  Procedure Laterality Date  . AORTIC VALVE REPLACEMENT  12/20/2011   Procedure: AORTIC VALVE REPLACEMENT (AVR);  Surgeon: Grace Isaac, MD;  Location: [place];  Service: Open Heart Surgery;  Laterality: N/A;  . CARDIAC CATHETERIZATION  12/14/11  . CHOLECYSTECTOMY  2001  . CORONARY ANGIOPLASTY  1994   "roto rooter"  . CORONARY ARTERY BYPASS GRAFT  12/20/2011   Procedure: CORONARY ARTERY BYPASS GRAFTING (CABG);  Surgeon: Grace Isaac, MD;  Location: [place];  Service: Open Heart Surgery;  Laterality: N/A;  . CYSTOSCOPY  12/20/2011   Procedure: CYSTOSCOPY;  Surgeon: Fredricka Bonine, MD;  Location: [place];  Service: Urology;  Laterality: N/A;  . gallstone removed  ~  2010   "was lodged below pancreatis"  . LEFT HEART CATHETERIZATION WITH CORONARY ANGIOGRAM N/A 12/14/2011   Procedure: LEFT HEART CATHETERIZATION WITH CORONARY ANGIOGRAM;  Surgeon: [name], MD;  Location: [facility] CATH LAB;  Service: Cardiovascular;  Laterality: N/A;  . LITHOTRIPSY  2006  . MASTOIDECTOMY Left   . PROSTATE SURGERY  07/1997  . RIGHT HEART CATHETERIZATION  12/14/2011   Procedure: RIGHT HEART CATH;  Surgeon: [name], MD;  Location: [facility] CATH LAB;  Service:  Cardiovascular;;  . TYMPANIC MEMBRANE REPAIR Right      Family History  Problem Relation Age of Onset  . Cancer Mother        brain  . Cancer Sister        breast and pancreatic  . Alcohol abuse Brother   . Hypertension Brother   . Stroke Brother      Social History   Substance and Sexual Activity  Drug Use No     Social History   Substance and Sexual Activity  Alcohol Use No     Social History   Tobacco Use  Smoking Status Never Smoker  Smokeless Tobacco Never Used     Outpatient Encounter Medications as of 05/06/2019  Medication Sig Note  . aspirin EC 81 MG tablet Take 81 mg by mouth at bedtime.   . Cholecalciferol (VITAMIN D) 50 MCG (2000 UT) CAPS Take 1 capsule by mouth daily.   . Cyanocobalamin (VITAMIN B12 PO) Take 1 tablet by mouth once a week.   . isosorbide mononitrate (IMDUR) 30 MG 24 hr tablet Take 0.5 tablets (15 mg total) by mouth daily.   [name] lisinopril (ZESTRIL) 20 MG tablet TAKE 1 TABLET BY MOUTH DAILY   . metoprolol tartrate (LOPRESSOR) 25 MG tablet TAKE 1 TABLET BY MOUTH 2 TIMES DAILY.   [name] warfarin (COUMADIN) 5 MG tablet TAKE 1/2 TO 1 TABLET BY MOUTH DAILY AS DIRECTED BY COUMADIN CLINIC 12/30/2018: Per pt- 5mg  5 days a week, 2.5mg  2 days a week   No facility-administered encounter medications on file as of 05/06/2019.     Allergies: Atorvastatin, Sulfa antibiotics, and Sulfonamide derivatives  There is no height or weight on file to calculate BMI.  There were no vitals taken for this visit.     Review of Systems     Objective:   Physical Exam        Assessment & Plan:  No diagnosis found.  No problem-specific Assessment & Plan notes found for this encounter.    FOLLOW-UP:  No follow-ups on file.

## 2019-05-07 ENCOUNTER — Telehealth: Payer: Self-pay

## 2019-05-07 NOTE — Telephone Encounter (Signed)
Received the following staff message from Dr. Gwenlyn Found on 05/06/2019:  [name], MD  Fonnie Mu, [place]; Orlean Holtrop, Kathy Breach, RN        Please make an appointment for Mr. Al to see an APP sometime in the next several weeks for evaluation of his blood pressure.     Spoke with pt who handed the phone to his wife to set up appt. Appt set for 11/9 at 8:15am with Jory Sims, DNP. Pt wife agreeable

## 2019-05-15 ENCOUNTER — Encounter

## 2019-05-16 ENCOUNTER — Encounter (INDEPENDENT_AMBULATORY_CARE_PROVIDER_SITE_OTHER): Payer: Self-pay

## 2019-05-17 ENCOUNTER — Other Ambulatory Visit: Payer: Self-pay | Admitting: Adult Health

## 2019-05-19 ENCOUNTER — Telehealth: Payer: Self-pay

## 2019-05-19 NOTE — Telephone Encounter (Signed)
Please call pt to schedule f/u prior to any further refills.  [name], CMA

## 2019-05-27 NOTE — Progress Notes (Signed)
Virtual Visit via Telephone Note  I connected with Curtis Brennan on 05/28/19 at  1:45 PM EST by telephone and verified that I am speaking with the correct person using two identifiers.  Location: Patient: Home Provider: In Clinic   I discussed the limitations, risks, security and privacy concerns of performing an evaluation and management service by telephone and the availability of in person appointments. I also discussed with the patient that there may be a patient responsible charge related to this service. The patient expressed understanding and agreed to proceed.   History of Present Illness: 12/30/2018 OV: Curtis Brennan calls in today with complaints of weakness and decreased energy that has steadily been worsening since end of Nov 2019. He denies acute change in health around that time period. He reports restful sleep 8-10 hrs/night. He reports one short nap day for 10-15 mins He estimates to drink 2-3 "glasses of water" day. He follows a vegetarian diet, primary source of protein derived from eggs, cheese, beans. He also reports intermittent dizziness, however denies this is related to position changes He reports clear nasal drainage, denies known hx of vertigo He has been on his BB for years, since his AVR 2013, so fatigue likely not r/t to BB therapy He reports being able to perform ADLs and IADLs without difficulty He denies any formal/regular exercise He underwent successful treatment of prostate ca in 1999 (surgery followed by radiation). His mother had brain ca He denies any tick bites  Wt today 190, last wt in office 192- stable His INR has been therapeutic, he denies any unusual bleeding/bruising. He has been taking his Vit B 12 1000 mcg every other day, instead of daily  12/06/2018 TeleMedicine visit with Dr. Melynda Ripple- He is statin intolerant, LDL 104 BP elevated, no change to Lisinopril 20mg  QD or Metoprolol 25mg  BID 12/26/2018 CBC :H/H 15.2/43.5 11/19/2017  TSH  1.70 He is taking B-12 1069mcg every other day   05/28/2019 OV:  01/22/2019 Cards OV: Fatigue- last 8 months along with dyspnea on exertion.  01/07/2019 CMP, CBC, TSH are all normal.  04/2018 echocardiogram normal EF. Offered nuclear stress test- patient declined Essential hypertension-slightly elevated systolic 0000000 Begin taking lisinopril 20 mg tablet in the morning Low-sodium diet Increase physical activity as tolerated Continue weight loss. Keep blood pressure log and bring to next clinic visit Coronary artery disease/ Stable angina- no chest pain today Begin Imdur 15 mg daily Recommended nuclear myocardial perfusion study-patient declined Offered SL nitroglycerin- patient declined Reviewed signs and symptoms of ACS Encouraged pt and daughter to follow up or call clinic if symptoms do not resolve or get worse.   05/28/2019 OV: Curtis Brennan calls in for refill on Metoprolol- advised that all anti-hypertensives should come from his Cardiologist. He reports ambulatory BP  SBP 130-150s, mean 140 DBP 60-70 HR 70-80s He denies chest pain/chest pressure. He continues to report fatigue, has been drinking the occasional boost shake to enhance nutrition. He denies recent falls, reports chronic bruising on arms- on coumadin.  01/09/2019 Labs: Thyroid Panel- stable  Vit B-12- elevated, he needs to reduce oral B-12 supplmentation  Vit d- low normal, 33.6  A1c-stable, 6.3  CMP-stable, kidney fx improved  Lipid Panel-  Improved from 8 months ago  Tot 163  TGs 102  HDL 47  LDL 96  STATIN INTOLERANT, he has repeatedly declined referral to lipid clinic- declined again today  Patient Care Team    Relationship Specialty Notifications Start End  , Berna Spare, NP PCP -  General Family Medicine  12/26/18   [name], MD PCP - Cardiology Cardiology Admissions 07/20/17   Grace Isaac, MD Consulting Physician Cardiothoracic Surgery  12/15/11   [name], MD Attending  Physician Cardiology  12/15/11     Patient Active Problem List   Diagnosis Date Noted  . Other fatigue 12/30/2018  . Weakness 12/30/2018  . Hypertension 06/25/2018  . Long term (current) use of anticoagulants 05/09/2018  . Healthcare maintenance 05/07/2018  . S/P AVR (aortic valve replacement)   . Coronary artery disease involving coronary bypass graft of native heart without angina pectoris   . Hx of pulmonary embolus   . Hyperlipidemia   . TIA (transient ischemic attack) 04/28/2018  . Bilateral lower extremity edema 11/30/2017  . PAF s/p CABG/AVR 12/26/2011  . Unstable angina ([place]) 12/26/2011  . COPD by CXR 12/16/2011  . Hx of CABG 12/15/2011  . AS, s/p tissue AVR 12/20/11 12/15/2011  . Chronic anticoagulation, on coumadin for pul emboli 12/15/2011  . Carotid artery disease, bilateral, asymptomatic 12/15/2011  . Dyslipidemia 10/28/2008  . Benign essential HTN 10/28/2008  . History of prior pulm embolism 10/28/2008  . PULMONARY HYPERTENSION 10/28/2008     Past Medical History:  Diagnosis Date  . Angina   . AS (aortic stenosis) moderate to severe by cath 12/14/11 for AVR  12/15/2011  . CAD (coronary artery disease) with history of LAD & Diag athrectomy in 1994, now with severe 3 vesseal CAD by cath 12/14/11 for CABG 12/15/2011  . Carotid artery disease, bilateral 12/15/2011  . Chronic anticoagulation, on coumadin for pul emboli 12/15/2011  . Complication of anesthesia 07/1997   "just wouldn't wake wakeup"  . Coronary artery disease   . Exertional dyspnea   . Heart murmur   . Hypertension   . Kidney stones   . Legionnaire's disease ([place]) 1986  . Pneumonia    "once"  . PONV (postoperative nausea and vomiting)   . Prostate cancer ([place])   . Pulmonary embolism ([place]) ~ 2011   "both lungs"  . Unstable angina ([place]) 12/15/2011     Past Surgical History:  Procedure Laterality Date  . AORTIC VALVE REPLACEMENT  12/20/2011   Procedure: AORTIC VALVE REPLACEMENT (AVR);  Surgeon:  Grace Isaac, MD;  Location: [place];  Service: Open Heart Surgery;  Laterality: N/A;  . CARDIAC CATHETERIZATION  12/14/11  . CHOLECYSTECTOMY  2001  . CORONARY ANGIOPLASTY  1994   "roto rooter"  . CORONARY ARTERY BYPASS GRAFT  12/20/2011   Procedure: CORONARY ARTERY BYPASS GRAFTING (CABG);  Surgeon: Grace Isaac, MD;  Location: [place];  Service: Open Heart Surgery;  Laterality: N/A;  . CYSTOSCOPY  12/20/2011   Procedure: CYSTOSCOPY;  Surgeon: Fredricka Bonine, MD;  Location: [place];  Service: Urology;  Laterality: N/A;  . gallstone removed  ~ 2010   "was lodged below pancreatis"  . LEFT HEART CATHETERIZATION WITH CORONARY ANGIOGRAM N/A 12/14/2011   Procedure: LEFT HEART CATHETERIZATION WITH CORONARY ANGIOGRAM;  Surgeon: [name], MD;  Location: [facility] CATH LAB;  Service: Cardiovascular;  Laterality: N/A;  . LITHOTRIPSY  2006  . MASTOIDECTOMY Left   . PROSTATE SURGERY  07/1997  . RIGHT HEART CATHETERIZATION  12/14/2011   Procedure: RIGHT HEART CATH;  Surgeon: [name], MD;  Location: [facility] CATH LAB;  Service: Cardiovascular;;  . TYMPANIC MEMBRANE REPAIR Right      Family History  Problem Relation Age of Onset  . Cancer Mother  brain  . Cancer Sister        breast and pancreatic  . Alcohol abuse Brother   . Hypertension Brother   . Stroke Brother      Social History   Substance and Sexual Activity  Drug Use No     Social History   Substance and Sexual Activity  Alcohol Use No     Social History   Tobacco Use  Smoking Status Never Smoker  Smokeless Tobacco Never Used     Outpatient Encounter Medications as of 05/28/2019  Medication Sig Note  . aspirin EC 81 MG tablet Take 81 mg by mouth at bedtime.   . Cholecalciferol (VITAMIN D) 50 MCG (2000 UT) CAPS Take 1 capsule by mouth daily.   . Cyanocobalamin (VITAMIN B12 PO) Take 1 tablet by mouth once a week.   . isosorbide mononitrate (IMDUR) 30 MG 24 hr tablet Take 0.5 tablets (15 mg total) by  mouth daily.   [name] lisinopril (ZESTRIL) 20 MG tablet TAKE 1 TABLET BY MOUTH DAILY   . metoprolol tartrate (LOPRESSOR) 25 MG tablet Take 1 tablet (25 mg total) by mouth 2 (two) times daily. OFFICE VISIT REQUIRED PRIOR TO ANY FURTHER REFILLS   . warfarin (COUMADIN) 5 MG tablet TAKE 1/2 TO 1 TABLET BY MOUTH DAILY AS DIRECTED BY COUMADIN CLINIC 12/30/2018: Per pt- 5mg  5 days a week, 2.5mg  2 days a week   No facility-administered encounter medications on file as of 05/28/2019.     Allergies: Atorvastatin, Sulfa antibiotics, and Sulfonamide derivatives  Body mass index is 27.91 kg/m.  Blood pressure (!) 153/66, pulse 81, temperature (!) 97.5 F (36.4 C), temperature source Oral, height 5\' 9"  (1.753 m), weight 189 lb (85.7 kg). Review of Systems: General:   Denies fever, chills, unexplained weight loss.  Optho/Auditory:   Denies visual changes, blurred vision/LOV Respiratory:   Denies SOB, DOE more than baseline levels.  Cardiovascular:   Denies chest pain, palpitations, new onset peripheral edema  Gastrointestinal:   Denies nausea, vomiting, diarrhea.  Genitourinary: Denies dysuria, freq/ urgency, flank pain or discharge from genitals.  Endocrine:Denies hot or cold intolerance, polyuria, polydipsia. Musculoskeletal:   Denies unexplained myalgias, joint swelling, unexplained arthralgias, gait problems.  Skin:  Denies rash, suspicious lesions Neurological:     Denies dizziness, unexplained weakness, numbness  Psychiatric/Behavioral:   Denies mood changes, suicidal or homicidal ideations, hallucinations This patient does not have sx concerning for COVID-19 Infection (ie; fever, chills, cough, new or worsening shortness of breath).  Observations/Objective: He has new hearing aids that were not functioning well- difficult to communicate with pt at times  Assessment and Plan: Continue all medications. Bring BP/HR log to your f/u next week to Cardiology. Ensured that he had enough  anti-hypertensives until his OV with Leonia Reader on 06/02/2019. Advised him for safety- all his cardiac medications should come from Cardiology. Discussed the benefits of Lipid Clinic, offered referral- he declined. Encouraged heart healthy diet, walk daily.   Follow Up Instructions: 3 months OV   I discussed the assessment and treatment plan with the patient. The patient was provided an opportunity to ask questions and all were answered. The patient agreed with the plan and demonstrated an understanding of the instructions.   The patient was advised to call back or seek an in-person evaluation if the symptoms worsen or if the condition fails to improve as anticipated.  I provided 12 minutes of non-face-to-face time during this encounter.   Esaw Grandchild, NP

## 2019-05-28 ENCOUNTER — Other Ambulatory Visit: Payer: Self-pay

## 2019-05-28 ENCOUNTER — Encounter: Payer: Self-pay | Admitting: Adult Health

## 2019-05-28 ENCOUNTER — Ambulatory Visit (INDEPENDENT_AMBULATORY_CARE_PROVIDER_SITE_OTHER): Payer: Medicare Other | Admitting: Adult Health

## 2019-05-28 DIAGNOSIS — I10 Essential (primary) hypertension: Secondary | ICD-10-CM

## 2019-05-28 DIAGNOSIS — E782 Mixed hyperlipidemia: Secondary | ICD-10-CM

## 2019-05-28 NOTE — Assessment & Plan Note (Signed)
Assessment and Plan: Continue all medications. Bring BP/HR log to your f/u next week to Cardiology. Ensured that he had enough anti-hypertensives until his OV with Leonia Reader on 06/02/2019. Advised him for safety- all his cardiac medications should come from Cardiology. Discussed the benefits of Lipid Clinic, offered referral- he declined. Encouraged heart healthy diet, walk daily.   Follow Up Instructions: 3 months OV   I discussed the assessment and treatment plan with the patient. The patient was provided an opportunity to ask questions and all were answered. The patient agreed with the plan and demonstrated an understanding of the instructions.   The patient was advised to call back or seek an in-person evaluation if the symptoms worsen or if the condition fails to improve as anticipated.  I provided 12 minutes of non-face-to-face time during this encounter.

## 2019-05-28 NOTE — Progress Notes (Signed)
Cardiology Office Note   Date:  06/02/2019   ID:  Curtis Brennan, DOB May 26, 1932, MRN [id number]  PCP:  Esaw Grandchild, NP  Cardiologist:  Avelina Laine [id number]    History of Present Illness: Curtis Brennan is a 85 y.o.  male we are following for ongoing assessment and management of coronary artery disease, history of directional coronary atherectomy in July 1994 by Dr. Gwenlyn Found, CABG on 12/20/2011 with LIMA to LAD, SVG to circumflex and to the PDA; aortic stenosis status post tissue aortic valve repair at the time of the CABG, hypertension, hyperlipidemia (intolerant to statin drugs), PE on Coumadin, left internal carotid artery stenosis, this is followed with vascular ultrasounds.  He was last seen by Dr. Gwenlyn Found on 04/10/2019, at which time a repeat carotid artery ultrasound and echocardiogram was planned, blood pressure was stable and he was continued on lisinopril and metoprolol.  He was noted to have occasional chest discomfort which did not appear to be cardiac per Dr. Naida Sleight assessment.  Carotid artery Doppler study on 04/22/2019 revealed right internal carotid artery with 1 to 39% stenosis with no hemodynamically significant plaque greater than 50%.  Left internal carotid artery revealed 60% to 79% stenosis with nonhemodynamically significant plaque greater than 50% noted in the CCA.  His velocities were elevated but essentially stable compared to prior exam.  Repeat echocardiogram on 04/22/2019 revealed normal LV systolic function of 123456 to 65%, he had mild diastolic dysfunction although this was not graded.  His aortic bioprosthesis was normally functioning with mildly increased transvalvular gradients compared to prior echoes.  He was recommended to have a repeat echo in 1 year.  He was seen by PCP, on 05/28/2019 complaining of worsening fatigue.He was started on Imdur 15 mg daily. He needs refills on his medications. He is intolerant to statin therapy.  Referral to lipid clinic has been repeatedly  refused. LDL was found to be 96 on last labs. He also needs PT INR today via our coumadin clinic.   He is here today with multiple complaints to include dyspnea on exertion, easy fatigability, dizziness, and lack of energy.  He also has a wound to his right forearm, which occurred when he scraped his arm removing friable skin and causing good bit of bleeding.  He would like [place] to look at that as well.   Past Medical History:  Diagnosis Date  . Angina   . AS (aortic stenosis) moderate to severe by cath 12/14/11 for AVR  12/15/2011  . CAD (coronary artery disease) with history of LAD & Diag athrectomy in 1994, now with severe 3 vesseal CAD by cath 12/14/11 for CABG 12/15/2011  . Carotid artery disease, bilateral 12/15/2011  . Chronic anticoagulation, on coumadin for pul emboli 12/15/2011  . Complication of anesthesia 07/1997   "just wouldn't wake wakeup"  . Coronary artery disease   . Exertional dyspnea   . Heart murmur   . Hypertension   . Kidney stones   . Legionnaire's disease ([place]) 1986  . Pneumonia    "once"  . PONV (postoperative nausea and vomiting)   . Prostate cancer ([place])   . Pulmonary embolism ([place]) ~ 2011   "both lungs"  . Unstable angina ([place]) 12/15/2011    Past Surgical History:  Procedure Laterality Date  . AORTIC VALVE REPLACEMENT  12/20/2011   Procedure: AORTIC VALVE REPLACEMENT (AVR);  Surgeon: Grace Isaac, MD;  Location: [place];  Service: Open Heart Surgery;  Laterality: N/A;  . CARDIAC CATHETERIZATION  12/14/11  .  CHOLECYSTECTOMY  2001  . CORONARY ANGIOPLASTY  1994   "roto rooter"  . CORONARY ARTERY BYPASS GRAFT  12/20/2011   Procedure: CORONARY ARTERY BYPASS GRAFTING (CABG);  Surgeon: Grace Isaac, MD;  Location: [place];  Service: Open Heart Surgery;  Laterality: N/A;  . CYSTOSCOPY  12/20/2011   Procedure: CYSTOSCOPY;  Surgeon: Fredricka Bonine, MD;  Location: [place];  Service: Urology;  Laterality: N/A;  . gallstone removed  ~ 2010   "was lodged below  pancreatis"  . LEFT HEART CATHETERIZATION WITH CORONARY ANGIOGRAM N/A 12/14/2011   Procedure: LEFT HEART CATHETERIZATION WITH CORONARY ANGIOGRAM;  Surgeon: [name], MD;  Location: [facility] CATH LAB;  Service: Cardiovascular;  Laterality: N/A;  . LITHOTRIPSY  2006  . MASTOIDECTOMY Left   . PROSTATE SURGERY  07/1997  . RIGHT HEART CATHETERIZATION  12/14/2011   Procedure: RIGHT HEART CATH;  Surgeon: [name], MD;  Location: [facility] CATH LAB;  Service: Cardiovascular;;  . TYMPANIC MEMBRANE REPAIR Right      Current Outpatient Medications  Medication Sig Dispense Refill  . aspirin EC 81 MG tablet Take 81 mg by mouth at bedtime.    . Cholecalciferol (VITAMIN D) 50 MCG (2000 UT) CAPS Take 1 capsule by mouth daily.    . Cyanocobalamin (VITAMIN B12 PO) Take 1 tablet by mouth once a week.    [name] lisinopril (ZESTRIL) 20 MG tablet TAKE 1 TABLET BY MOUTH DAILY 90 tablet 3  . warfarin (COUMADIN) 5 MG tablet TAKE 1/2 TO 1 TABLET BY MOUTH DAILY AS DIRECTED BY COUMADIN CLINIC 30 tablet 1  . metoprolol succinate (TOPROL-XL) 50 MG 24 hr tablet Take 1 tablet (50 mg total) by mouth daily. Take with or immediately following a meal. 90 tablet 3   No current facility-administered medications for this visit.     Allergies:   Atorvastatin, Sulfa antibiotics, and Sulfonamide derivatives    Social History:  The patient  reports that he has never smoked. He has never used smokeless tobacco. He reports that he does not drink alcohol or use drugs.   Family History:  The patient's family history includes Alcohol abuse in his brother; Cancer in his mother and sister; Hypertension in his brother; Stroke in his brother.    ROS: All other systems are reviewed and negative. Unless otherwise mentioned in H&P    PHYSICAL EXAM: VS:  BP (!) 162/82   Pulse 77   Temp (!) 97.3 F (36.3 C)   Ht 5\' 9"  (1.753 m)   Wt 190 lb 9.6 oz (86.5 kg)   SpO2 98%   BMI 28.15 kg/m  , BMI Body mass index is 28.15 kg/m. GEN: Well  nourished, well developed, in no acute distress HEENT: normal Neck: no JVD, carotid bruits, or masses Cardiac: RRR; no murmurs, rubs, or gallops,no edema  Respiratory:  Clear to auscultation bilaterally, normal work of breathing GI: soft, nontender, nondistended, + BS MS: no deformity or atrophy Skin: warm and dry, no rash, skin tear to the right forearm proximal to the wrist, some bleeding and clear drainage. Neuro:  Strength and sensation are intact, bilateral hand tremors. Psych: euthymic mood, full affect   EKG: Normal sinus rhythm, heart rate of 74 bpm  Recent Labs: 12/26/2018: Hemoglobin 15.2; Platelets 157 01/07/2019: ALT 19; BUN 20; Creatinine, Ser 1.20; Potassium 5.1; Sodium 139; TSH 2.140    Lipid Panel    Component Value Date/Time   CHOL 163 01/07/2019 0948   TRIG 102 01/07/2019 0948  HDL 47 01/07/2019 0948   CHOLHDL 3.5 01/07/2019 0948   CHOLHDL 4.9 04/29/2018 0642   VLDL 18 04/29/2018 0642   LDLCALC 96 01/07/2019 0948      Wt Readings from Last 3 Encounters:  06/02/19 190 lb 9.6 oz (86.5 kg)  05/28/19 189 lb (85.7 kg)  04/10/19 189 lb (85.7 kg)      Other studies Reviewed: Echocardiogram April 28, 2019 1. Left ventricular ejection fraction, by visual estimation, is 60 to 65%. The left ventricle has normal function. Normal left ventricular size. There is no left ventricular hypertrophy.  2. Left ventricular diastolic Doppler parameters are consistent with pseudonormalization pattern of LV diastolic filling.  3. Global right ventricle has normal systolic function.The right ventricular size is normal. No increase in right ventricular wall thickness.  4. Left atrial size was mildly dilated.  5. Right atrial size was normal.  6. The mitral valve is normal in structure. Trace mitral valve regurgitation. No evidence of mitral stenosis.  7. The tricuspid valve is normal in structure. Tricuspid valve regurgitation is mild.  8. Aortic valve mean gradient measures 14.8  mmHg.  9. Aortic valve peak gradient measures 26.7 mmHg. 10. The aortic valve is normal in structure. Aortic valve regurgitation is trivial by color flow Doppler. 11. Biosprosthetic aortic valve. Mildly increased aortic velocity when compared to prior. 12. The pulmonic valve was normal in structure. Pulmonic valve regurgitation is trivial by color flow Doppler. 13. Mildly elevated pulmonary artery systolic pressure. 14. The inferior vena cava is normal in size with greater than 50% respiratory variability, suggesting right atrial pressure of 3 mmHg. 15. The average left ventricular global longitudinal strain is -20.4 %.   ASSESSMENT AND PLAN:  1.  CAD: History of CABG with directional coronary arthrectomy in July 1994, he is complaining of generalized fatigue, dyspnea on exertion, and dizziness.  He also states that his heart is racing sometimes when he walks short distances.  He did feels a little bit of pressure in his chest.  I will schedule him for a Lexiscan Myoview to evaluate for ischemia, with known history of CABG and symptoms.  If abnormal would likely need to be scheduled for cardiac catheterization.  The patient will have his metoprolol changed to metoprolol succinate 50 mg at at bedtime to help with daytime fatigue.  Due to dizziness I will discontinue isosorbide as he states that has worsened since taking it.  2.  Aortic valve disease: Most recent echocardiogram was in September 2020 he will have a repeat echocardiogram in September 2021 .  3.  Skin tear: This does not appear to be infected.  Dressing does tear her skin when he changes it.  We have redressed it using a Telfa pad cleaning it with saline and adding A&E ointment.  The patient is given these leftover supplies to take home with him.  If he is find signs of infection, worsening bleeding, or pain he is to see his PCP.  4.  Essential tremor: He does have bilateral tremors noted in his hands and arms.  He may need to have  referral to neurology at the discretion of his PCP.  His daughter who is with him states that the tremors get worse when he becomes anxious.  He is easily anxious concerning for old events.  His daughter states that his wife has the TV on all day long which causes worsening anxiety for him.  He is advised to be outside as much as possible.   Current medicines are  reviewed at length with the patient today.  We have spent over 45 minutes assessing this patient, discussing medications, and redressing his wound to his right wrist skin tear.  Labs/ tests ordered today include: Lexiscan Myoview Phill Myron. [place] Pugh, ANP, AACC   06/02/2019 8:59 AM    [place] [address] Office [phone] Fax [phone]  Notice: This dictation was prepared with Dragon dictation along with smaller phrase technology. Any transcriptional errors that result from this process are unintentional and may not be corrected upon review.

## 2019-05-28 NOTE — Assessment & Plan Note (Signed)
Again declined referral to lipid clinic

## 2019-06-02 ENCOUNTER — Ambulatory Visit: Payer: Medicare Other | Admitting: Adult Health

## 2019-06-02 ENCOUNTER — Encounter: Payer: Self-pay | Admitting: Adult Health

## 2019-06-02 ENCOUNTER — Other Ambulatory Visit: Payer: Self-pay

## 2019-06-02 ENCOUNTER — Ambulatory Visit (INDEPENDENT_AMBULATORY_CARE_PROVIDER_SITE_OTHER): Payer: Medicare Other | Admitting: Pharmacist

## 2019-06-02 VITALS — BP 162/82 | HR 77 | Temp 97.3°F | Ht 69.0 in | Wt 190.6 lb

## 2019-06-02 DIAGNOSIS — I10 Essential (primary) hypertension: Secondary | ICD-10-CM

## 2019-06-02 DIAGNOSIS — I517 Cardiomegaly: Secondary | ICD-10-CM

## 2019-06-02 DIAGNOSIS — R5383 Other fatigue: Secondary | ICD-10-CM

## 2019-06-02 DIAGNOSIS — Z952 Presence of prosthetic heart valve: Secondary | ICD-10-CM

## 2019-06-02 DIAGNOSIS — I2510 Atherosclerotic heart disease of native coronary artery without angina pectoris: Secondary | ICD-10-CM

## 2019-06-02 DIAGNOSIS — R251 Tremor, unspecified: Secondary | ICD-10-CM

## 2019-06-02 DIAGNOSIS — S61511A Laceration without foreign body of right wrist, initial encounter: Secondary | ICD-10-CM

## 2019-06-02 LAB — POCT INR: INR: 2.9 (ref 2.0–3.0)

## 2019-06-02 MED ORDER — METOPROLOL SUCCINATE ER 50 MG PO TB24: 50.0000 mg | ORAL_TABLET | Freq: Every day | ORAL | 3 refills | Status: AC

## 2019-06-02 NOTE — Addendum Note (Signed)
Addended by: Vennie Homans on: 06/02/2019 09:16 AM   Modules accepted: Orders

## 2019-06-02 NOTE — Patient Instructions (Signed)
Medication Instructions:  STOP Metoprolol Tartrate STOP- Isosorbide START- Metoprolol Succinate 50 mg by mouth at bedtime  *If you need a refill on your cardiac medications before your next appointment, please call your pharmacy*  Lab Work: None Ordered  Testing/Procedures: Your physician has requested that you have a lexiscan myoview. For further information please visit [email or web address]. Please follow instruction sheet, as given.  Follow-Up: At [facility], you and your health needs are our priority.  As part of our continuing mission to provide you with exceptional heart care, we have created designated Provider Care Teams.  These Care Teams include your primary Cardiologist (physician) and Advanced Practice Providers (APPs -  Physician Assistants and Nurse Practitioners) who all work together to provide you with the care you need, when you need it.  Your next appointment:   2 weeks  The format for your next appointment:   In Person  Provider:   Jory Sims, DNP, ANP

## 2019-06-03 ENCOUNTER — Telehealth (HOSPITAL_COMMUNITY): Payer: Self-pay

## 2019-06-03 NOTE — Telephone Encounter (Signed)
Encounter complete. 

## 2019-06-04 ENCOUNTER — Other Ambulatory Visit: Payer: Self-pay

## 2019-06-04 ENCOUNTER — Ambulatory Visit (HOSPITAL_COMMUNITY)
Admission: RE | Admit: 2019-06-04 | Discharge: 2019-06-04 | Disposition: A | Discharge status: Home / Self Care | Payer: Medicare Other | Source: Ambulatory Visit | Attending: Cardiovascular Disease | Admitting: Cardiovascular Disease

## 2019-06-04 DIAGNOSIS — I517 Cardiomegaly: Secondary | ICD-10-CM | POA: Diagnosis present

## 2019-06-04 LAB — MYOCARDIAL PERFUSION IMAGING
LV dias vol: 90 mL (ref 62–150)
LV sys vol: 38 mL
Peak HR: 97 {beats}/min
Rest HR: 73 {beats}/min
SDS: 3
SRS: 1
SSS: 4
TID: 0.99

## 2019-06-04 MED ORDER — AMINOPHYLLINE 25 MG/ML IV SOLN
75.0000 mg | Freq: Once | INTRAVENOUS | Status: AC
  Administered 2019-06-04: 75 mg via INTRAVENOUS

## 2019-06-04 MED ORDER — TECHNETIUM TC 99M TETROFOSMIN IV KIT
10.1000 | PACK | Freq: Once | INTRAVENOUS | Status: AC | PRN
  Administered 2019-06-04: 10.1 via INTRAVENOUS
  Filled 2019-06-04: qty 11

## 2019-06-04 MED ORDER — TECHNETIUM TC 99M TETROFOSMIN IV KIT
32.2000 | PACK | Freq: Once | INTRAVENOUS | Status: AC | PRN
  Administered 2019-06-04: 32.2 via INTRAVENOUS
  Filled 2019-06-04: qty 33

## 2019-06-04 MED ORDER — REGADENOSON 0.4 MG/5ML IV SOLN
0.4000 mg | Freq: Once | INTRAVENOUS | Status: AC
  Administered 2019-06-04: 0.4 mg via INTRAVENOUS

## 2019-06-07 ENCOUNTER — Encounter (HOSPITAL_COMMUNITY): Payer: Self-pay

## 2019-06-07 ENCOUNTER — Other Ambulatory Visit: Payer: Self-pay

## 2019-06-07 ENCOUNTER — Ambulatory Visit (HOSPITAL_COMMUNITY)
Admission: EM | Admit: 2019-06-07 | Discharge: 2019-06-07 | Disposition: A | Discharge status: Home / Self Care | Payer: Medicare Other | Attending: Family Medicine | Admitting: Family Medicine

## 2019-06-07 DIAGNOSIS — H6981 Other specified disorders of Eustachian tube, right ear: Secondary | ICD-10-CM

## 2019-06-07 DIAGNOSIS — H6991 Unspecified Eustachian tube disorder, right ear: Secondary | ICD-10-CM

## 2019-06-07 MED ORDER — ACETAMINOPHEN 500 MG PO TABS: 500.0000 mg | ORAL_TABLET | Freq: Four times a day (QID) | ORAL | 0 refills | Status: AC | PRN

## 2019-06-07 MED ORDER — FLUTICASONE PROPIONATE 50 MCG/ACT NA SUSP: 1.0000 | Freq: Every day | NASAL | 2 refills | Status: AC

## 2019-06-07 MED ORDER — CETIRIZINE HCL 5 MG PO TABS: 5.0000 mg | ORAL_TABLET | Freq: Every day | ORAL | 1 refills | Status: AC

## 2019-06-07 NOTE — ED Triage Notes (Signed)
Pt present severe headache  With right ear pain. Symptoms started almost an year ago. Pt state at symptoms are off an on. The severity of the symptoms make him be dizziness and cannot see.

## 2019-06-07 NOTE — Discharge Instructions (Addendum)
We will try the Flonase nasal spray and Zyrtec daily Tylenol extra strength every 6 hours If symptoms continue or worsen he will need to follow-up with his doctor or go to the ER.

## 2019-06-09 NOTE — ED Provider Notes (Signed)
[facility]    CSN: [id number] Arrival date & time: 06/07/19  1551      History   Chief Complaint Chief Complaint  Patient presents with  . Migraine  . Otalgia    right ear    HPI Curtis Brennan is a 85 y.o. male.   Patient is an 85 year old male with past medical history of angina, aortic stenosis, CAD, chronic anticoagulation, hypertension, Legionnaires' disease, pneumonia, prostate cancer, PE, TIA, hyperlipidemia, PAF.  He presents today with frontal headache, right ear pain, mild dizziness and blurred vision.  Symptoms have been constant, waxing and waning over the past 2 weeks.  He has had similar episodes like this over the past year.  He has had multiple ear surgeries and follows with an ear nose and throat specialist.  He has had mild rhinorrhea without cough, chest congestion or fever.  Describes the sensation as pressure in his head.  He took Tylenol with some relief.  Is here with daughter today with concerns of possible ear infection.  No slurred speech, facial droop, extremity weakness, trouble with gait, nausea, double vision.  Daughter also mentions that patient had new hearing aids placed approximately 2 weeks ago.  He is supposed to follow-up with them due to fitting issues.  ROS per HPI      Past Medical History:  Diagnosis Date  . Angina   . AS (aortic stenosis) moderate to severe by cath 12/14/11 for AVR  12/15/2011  . CAD (coronary artery disease) with history of LAD & Diag athrectomy in 1994, now with severe 3 vesseal CAD by cath 12/14/11 for CABG 12/15/2011  . Carotid artery disease, bilateral 12/15/2011  . Chronic anticoagulation, on coumadin for pul emboli 12/15/2011  . Complication of anesthesia 07/1997   "just wouldn't wake wakeup"  . Coronary artery disease   . Exertional dyspnea   . Heart murmur   . Hypertension   . Kidney stones   . Legionnaire's disease ([place]) 1986  . Pneumonia    "once"  . PONV (postoperative nausea and vomiting)    . Prostate cancer ([place])   . Pulmonary embolism ([place]) ~ 2011   "both lungs"  . Unstable angina ([place]) 12/15/2011    Patient Active Problem List   Diagnosis Date Noted  . Other fatigue 12/30/2018  . Weakness 12/30/2018  . Hypertension 06/25/2018  . Long term (current) use of anticoagulants 05/09/2018  . Healthcare maintenance 05/07/2018  . S/P AVR (aortic valve replacement)   . Coronary artery disease involving coronary bypass graft of native heart without angina pectoris   . Hx of pulmonary embolus   . Hyperlipidemia   . TIA (transient ischemic attack) 04/28/2018  . Bilateral lower extremity edema 11/30/2017  . PAF s/p CABG/AVR 12/26/2011  . Unstable angina ([place]) 12/26/2011  . COPD by CXR 12/16/2011  . Hx of CABG 12/15/2011  . AS, s/p tissue AVR 12/20/11 12/15/2011  . Chronic anticoagulation, on coumadin for pul emboli 12/15/2011  . Carotid artery disease, bilateral, asymptomatic 12/15/2011  . Dyslipidemia 10/28/2008  . Benign essential HTN 10/28/2008  . History of prior pulm embolism 10/28/2008  . PULMONARY HYPERTENSION 10/28/2008    Past Surgical History:  Procedure Laterality Date  . AORTIC VALVE REPLACEMENT  12/20/2011   Procedure: AORTIC VALVE REPLACEMENT (AVR);  Surgeon: Grace Isaac, MD;  Location: [facility];  Service: Open Heart Surgery;  Laterality: N/A;  . CARDIAC CATHETERIZATION  12/14/11  . CHOLECYSTECTOMY  2001  . CORONARY ANGIOPLASTY  1994   "  roto rooter"  . CORONARY ARTERY BYPASS GRAFT  12/20/2011   Procedure: CORONARY ARTERY BYPASS GRAFTING (CABG);  Surgeon: Grace Isaac, MD;  Location: [facility];  Service: Open Heart Surgery;  Laterality: N/A;  . CYSTOSCOPY  12/20/2011   Procedure: CYSTOSCOPY;  Surgeon: Fredricka Bonine, MD;  Location: [facility];  Service: Urology;  Laterality: N/A;  . gallstone removed  ~ 2010   "was lodged below pancreatis"  . LEFT HEART CATHETERIZATION WITH CORONARY ANGIOGRAM N/A 12/14/2011   Procedure: LEFT HEART CATHETERIZATION WITH  CORONARY ANGIOGRAM;  Surgeon: [name], MD;  Location: [facility] CATH LAB;  Service: Cardiovascular;  Laterality: N/A;  . LITHOTRIPSY  2006  . MASTOIDECTOMY Left   . PROSTATE SURGERY  07/1997  . RIGHT HEART CATHETERIZATION  12/14/2011   Procedure: RIGHT HEART CATH;  Surgeon: [name], MD;  Location: [facility] CATH LAB;  Service: Cardiovascular;;  . TYMPANIC MEMBRANE REPAIR Right        Home Medications    Prior to Admission medications   Medication Sig Start Date End Date Taking? Authorizing Provider  acetaminophen (TYLENOL) 500 MG tablet Take 1 tablet (500 mg total) by mouth every 6 (six) hours as needed. 06/07/19   Loura Halt A, NP  aspirin EC 81 MG tablet Take 81 mg by mouth at bedtime.    [provider]  cetirizine (ZYRTEC) 5 MG tablet Take 1 tablet (5 mg total) by mouth daily. 06/07/19   Loura Halt A, NP  Cholecalciferol (VITAMIN D) 50 MCG (2000 UT) CAPS Take 1 capsule by mouth daily.    [provider]  Cyanocobalamin (VITAMIN B12 PO) Take 1 tablet by mouth once a week.    [provider]  fluticasone (FLONASE) 50 MCG/ACT nasal spray Place 1 spray into both nostrils daily. 06/07/19   Loura Halt A, NP  lisinopril (ZESTRIL) 20 MG tablet TAKE 1 TABLET BY MOUTH DAILY 04/21/19   [name], MD  metoprolol succinate (TOPROL-XL) 50 MG 24 hr tablet Take 1 tablet (50 mg total) by mouth daily. Take with or immediately following a meal. 06/02/19 08/31/19  Lendon Colonel, NP  warfarin (COUMADIN) 5 MG tablet TAKE 1/2 TO 1 TABLET BY MOUTH DAILY AS DIRECTED BY COUMADIN CLINIC 11/07/18   [name], MD    Family History Family History  Problem Relation Age of Onset  . Cancer Mother        brain  . Cancer Sister        breast and pancreatic  . Alcohol abuse Brother   . Hypertension Brother   . Stroke Brother     Social History Social History   Tobacco Use  . Smoking status: Never Smoker  . Smokeless tobacco: Never Used  Substance Use  Topics  . Alcohol use: No  . Drug use: No     Allergies   Atorvastatin, Sulfa antibiotics, and Sulfonamide derivatives   Review of Systems Review of Systems   Physical Exam Triage Vital Signs ED Triage Vitals  Enc Vitals Group     BP 06/07/19 1618 (!) 148/61     Pulse Rate 06/07/19 1618 76     Resp 06/07/19 1618 18     Temp 06/07/19 1618 98.5 F (36.9 C)     Temp Source 06/07/19 1618 Skin     SpO2 06/07/19 1618 98 %     Weight --      Height --      Head Circumference --  Peak Flow --      Pain Score 06/07/19 1619 6     Pain Loc --      Pain Edu? --      Excl. in [place]? --    No data found.  Updated Vital Signs BP (!) 148/61 (BP Location: Right Arm)   Pulse 76   Temp 98.5 F (36.9 C) (Skin)   Resp 18   SpO2 98%   Visual Acuity Right Eye Distance:   Left Eye Distance:   Bilateral Distance:    Right Eye Near:   Left Eye Near:    Bilateral Near:     Physical Exam Vitals signs and nursing note reviewed.  Constitutional:      General: He is not in acute distress.    Appearance: Normal appearance. He is not ill-appearing, toxic-appearing or diaphoretic.  HENT:     Head: Normocephalic and atraumatic.     Right Ear: Tympanic membrane, ear canal and external ear normal. There is no impacted cerumen.     Left Ear: Tympanic membrane, ear canal and external ear normal. There is no impacted cerumen.     Nose: Nose normal.     Mouth/Throat:     Mouth: Mucous membranes are moist.     Pharynx: Oropharynx is clear.  Eyes:     Conjunctiva/sclera: Conjunctivae normal.  Neck:     Musculoskeletal: Normal range of motion.  Cardiovascular:     Rate and Rhythm: Normal rate and regular rhythm.     Pulses: Normal pulses.     Heart sounds: Normal heart sounds.     Comments: Murmur  Pulmonary:     Effort: Pulmonary effort is normal.     Breath sounds: Normal breath sounds.  Musculoskeletal: Normal range of motion.  Skin:    General: Skin is warm and dry.   Neurological:     General: No focal deficit present.     Mental Status: He is alert.     Cranial Nerves: No cranial nerve deficit.     Sensory: No sensory deficit.     Motor: No weakness.     Coordination: Coordination normal.     Gait: Gait normal.     Deep Tendon Reflexes: Reflexes normal.  Psychiatric:        Mood and Affect: Mood normal.        Behavior: Behavior normal.      UC Treatments / Results  Labs (all labs ordered are listed, but only abnormal results are displayed) Labs Reviewed - No data to display  EKG   Radiology No results found.  Procedures Procedures (including critical care time)  Medications Ordered in UC Medications - No data to display  Initial Impression / Assessment and Plan / UC Course  I have reviewed the triage vital signs and the nursing notes.  Pertinent labs & imaging results that were available during my care of the patient were reviewed by me and considered in my medical decision making (see chart for details).     85 year old male with what appears to be eustachian tube dysfunction. No signs of outer or inner ear infection. Patient neurological exam normal. Pupils equal, round and reactive to light. Not concerned for CVA at this time. Doubt migraine  This could also be an issue with his hearing aids causing discomfort in the right ear.  Recommended Zyrtec, Flonase and Tylenol as needed. Also recommended follow-up with ear nose and throat specialist and hearing a specialist  Strict return and ER  precautions that if the symptoms continue or worsen he will need to go to the ER for further evaluation. Patient and daughter understanding and agreed to plan.  Final Clinical Impressions(s) / UC Diagnoses   Final diagnoses:  Eustachian tube dysfunction, right     Discharge Instructions     We will try the Flonase nasal spray and Zyrtec daily Tylenol extra strength every 6 hours If symptoms continue or worsen he will need to  follow-up with his doctor or go to the ER.    ED Prescriptions    Medication Sig Dispense Auth. Provider   fluticasone (FLONASE) 50 MCG/ACT nasal spray Place 1 spray into both nostrils daily. 16 g Marcio Hoque A, NP   cetirizine (ZYRTEC) 5 MG tablet Take 1 tablet (5 mg total) by mouth daily. 30 tablet Tallulah Hosman A, NP   acetaminophen (TYLENOL) 500 MG tablet Take 1 tablet (500 mg total) by mouth every 6 (six) hours as needed. 30 tablet Loura Halt A, NP     PDMP not reviewed this encounter.   Orvan July, NP 06/09/19 1139

## 2019-06-23 ENCOUNTER — Ambulatory Visit: Payer: Medicare Other | Admitting: Adult Health

## 2019-07-29 ENCOUNTER — Encounter (INDEPENDENT_AMBULATORY_CARE_PROVIDER_SITE_OTHER): Payer: Self-pay

## 2019-07-29 ENCOUNTER — Other Ambulatory Visit: Payer: Self-pay

## 2019-07-29 ENCOUNTER — Ambulatory Visit (INDEPENDENT_AMBULATORY_CARE_PROVIDER_SITE_OTHER): Payer: Medicare Other | Admitting: Pharmacist Clinician (PhC)/ Clinical Pharmacy Specialist

## 2019-07-29 DIAGNOSIS — Z7901 Long term (current) use of anticoagulants: Secondary | ICD-10-CM

## 2019-07-29 DIAGNOSIS — Z952 Presence of prosthetic heart valve: Secondary | ICD-10-CM

## 2019-07-29 DIAGNOSIS — Z86711 Personal history of pulmonary embolism: Secondary | ICD-10-CM

## 2019-07-29 LAB — POCT INR: INR: 3.8 — AB (ref 2.0–3.0)

## 2019-08-08 ENCOUNTER — Other Ambulatory Visit: Payer: Self-pay | Admitting: Cardiovascular Disease

## 2019-09-09 ENCOUNTER — Telehealth: Payer: Self-pay | Admitting: *Deleted

## 2019-09-09 NOTE — Telephone Encounter (Signed)
Curtis Brennan did not want his three months office visit with the PA, wanted to wait and see Dr.Berry in September. Recall removed.

## 2019-09-19 ENCOUNTER — Ambulatory Visit (INDEPENDENT_AMBULATORY_CARE_PROVIDER_SITE_OTHER): Payer: Medicare Other | Admitting: Family Medicine

## 2019-09-19 ENCOUNTER — Encounter: Payer: Self-pay | Admitting: Family Medicine

## 2019-09-19 ENCOUNTER — Other Ambulatory Visit: Payer: Self-pay

## 2019-09-19 VITALS — BP 148/88 | HR 83 | Ht 69.0 in | Wt 189.0 lb

## 2019-09-19 DIAGNOSIS — E559 Vitamin D deficiency, unspecified: Secondary | ICD-10-CM

## 2019-09-19 DIAGNOSIS — R7303 Prediabetes: Secondary | ICD-10-CM

## 2019-09-19 DIAGNOSIS — Z952 Presence of prosthetic heart valve: Secondary | ICD-10-CM

## 2019-09-19 DIAGNOSIS — E538 Deficiency of other specified B group vitamins: Secondary | ICD-10-CM

## 2019-09-19 DIAGNOSIS — I10 Essential (primary) hypertension: Secondary | ICD-10-CM | POA: Diagnosis not present

## 2019-09-19 DIAGNOSIS — I6523 Occlusion and stenosis of bilateral carotid arteries: Secondary | ICD-10-CM | POA: Insufficient documentation

## 2019-09-19 DIAGNOSIS — Z789 Other specified health status: Secondary | ICD-10-CM

## 2019-09-19 MED ORDER — VITAMIN D (ERGOCALCIFEROL) 1.25 MG (50000 UNIT) PO CAPS: ORAL_CAPSULE | ORAL | 3 refills | Status: AC

## 2019-09-19 NOTE — Patient Instructions (Signed)
Please check your blood pressures daily and write down the blood pressure as well as heart rate.  Please make sure you have this available for any doctor visit follow-ups that you may have.  If the levels are out of the range as determined by your cardiologist, please let them or [place] know.   Hypertension Hypertension, commonly called high blood pressure, is when the force of blood pumping through the arteries is too strong. The arteries are the blood vessels that carry blood from the heart throughout the body. Hypertension forces the heart to work harder to pump blood and may cause arteries to become narrow or stiff. Having untreated or uncontrolled hypertension can cause heart attacks, strokes, kidney disease, and other problems. A blood pressure reading consists of a higher number over a lower number. Ideally, your blood pressure should be below 120/80. The first ("top") number is called the systolic pressure. It is a measure of the pressure in your arteries as your heart beats. The second ("bottom") number is called the diastolic pressure. It is a measure of the pressure in your arteries as the heart relaxes. What are the causes? The cause of this condition is not known. What increases the risk? Some risk factors for high blood pressure are under your control. Others are not. Factors you can change  Smoking.  Having type 2 diabetes mellitus, high cholesterol, or both.  Not getting enough exercise or physical activity.  Being overweight.  Having too much fat, sugar, calories, or salt (sodium) in your diet.  Drinking too much alcohol. Factors that are difficult or impossible to change  Having chronic kidney disease.  Having a family history of high blood pressure.  Age. Risk increases with age.  Race. You may be at higher risk if you are African-American.  Gender. Men are at higher risk than women before age 47. After age 63, women are at higher risk than men.  Having obstructive  sleep apnea.  Stress. What are the signs or symptoms? Extremely high blood pressure (hypertensive crisis) may cause:  Headache.  Anxiety.  Shortness of breath.  Nosebleed.  Nausea and vomiting.  Severe chest pain.  Jerky movements you cannot control (seizures).  How is this diagnosed? This condition is diagnosed by measuring your blood pressure while you are seated, with your arm resting on a surface. The cuff of the blood pressure monitor will be placed directly against the skin of your upper arm at the level of your heart. It should be measured at least twice using the same arm. Certain conditions can cause a difference in blood pressure between your right and left arms. Certain factors can cause blood pressure readings to be lower or higher than normal (elevated) for a short period of time:  When your blood pressure is higher when you are in a health care provider's office than when you are at home, this is called white coat hypertension. Most people with this condition do not need medicines.  When your blood pressure is higher at home than when you are in a health care provider's office, this is called masked hypertension. Most people with this condition may need medicines to control blood pressure.  If you have a high blood pressure reading during one visit or you have normal blood pressure with other risk factors:  You may be asked to return on a different day to have your blood pressure checked again.  You may be asked to monitor your blood pressure at home for 1 week  or longer.  If you are diagnosed with hypertension, you may have other blood or imaging tests to help your health care provider understand your overall risk for other conditions. How is this treated? This condition is treated by making healthy lifestyle changes, such as eating healthy foods, exercising more, and reducing your alcohol intake. Your health care provider may prescribe medicine if lifestyle changes  are not enough to get your blood pressure under control, and if:  Your systolic blood pressure is above 130.  Your diastolic blood pressure is above 80.  Your personal target blood pressure may vary depending on your medical conditions, your age, and other factors. Follow these instructions at home: Eating and drinking  Eat a diet that is high in fiber and potassium, and low in sodium, added sugar, and fat. An example eating plan is called the DASH (Dietary Approaches to Stop Hypertension) diet. To eat this way: ? Eat plenty of fresh fruits and vegetables. Try to fill half of your plate at each meal with fruits and vegetables. ? Eat whole grains, such as whole wheat pasta, brown rice, or whole grain bread. Fill about one quarter of your plate with whole grains. ? Eat or drink low-fat dairy products, such as skim milk or low-fat yogurt. ? Avoid fatty cuts of meat, processed or cured meats, and poultry with skin. Fill about one quarter of your plate with lean proteins, such as fish, chicken without skin, beans, eggs, and tofu. ? Avoid premade and processed foods. These tend to be higher in sodium, added sugar, and fat.  Reduce your daily sodium intake. Most people with hypertension should eat less than 1,500 mg of sodium a day.  Limit alcohol intake to no more than 1 drink a day for nonpregnant women and 2 drinks a day for men. One drink equals 12 oz of beer, 5 oz of wine, or 1 oz of hard liquor. Lifestyle  Work with your health care provider to maintain a healthy body weight or to lose weight. Ask what an ideal weight is for you.  Get at least 30 minutes of exercise that causes your heart to beat faster (aerobic exercise) most days of the week. Activities may include walking, swimming, or biking.  Include exercise to strengthen your muscles (resistance exercise), such as pilates or lifting weights, as part of your weekly exercise routine. Try to do these types of exercises for 30 minutes at  least 3 days a week.  Do not use any products that contain nicotine or tobacco, such as cigarettes and e-cigarettes. If you need help quitting, ask your health care provider.  Monitor your blood pressure at home as told by your health care provider.  Keep all follow-up visits as told by your health care provider. This is important. Medicines  Take over-the-counter and prescription medicines only as told by your health care provider. Follow directions carefully. Blood pressure medicines must be taken as prescribed.  Do not skip doses of blood pressure medicine. Doing this puts you at risk for problems and can make the medicine less effective.  Ask your health care provider about side effects or reactions to medicines that you should watch for. Contact a health care provider if:  You think you are having a reaction to a medicine you are taking.  You have headaches that keep coming back (recurring).  You feel dizzy.  You have swelling in your ankles.  You have trouble with your vision. Get help right away if:  You develop  a severe headache or confusion.  You have unusual weakness or numbness.  You feel faint.  You have severe pain in your chest or abdomen.  You vomit repeatedly.  You have trouble breathing. Summary  Hypertension is when the force of blood pumping through your arteries is too strong. If this condition is not controlled, it may put you at risk for serious complications.  Your personal target blood pressure may vary depending on your medical conditions, your age, and other factors. For most people, a normal blood pressure is less than 120/80.  Hypertension is treated with lifestyle changes, medicines, or a combination of both. Lifestyle changes include weight loss, eating a healthy, low-sodium diet, exercising more, and limiting alcohol. This information is not intended to replace advice given to you by your health care provider. Make sure you discuss any  questions you have with your health care provider. Document Released: 07/10/2005 Document Revised: 06/07/2016 Document Reviewed: 06/07/2016 Elsevier Interactive Patient Education  2018 [employer].    How to Take Your Blood Pressure   Blood pressure is a measurement of how strongly your blood is pressing against the walls of your arteries. Arteries are blood vessels that carry blood from your heart throughout your body. Your health care provider takes your blood pressure at each office visit. You can also take your own blood pressure at home with a blood pressure machine. You may need to take your own blood pressure:  To confirm a diagnosis of high blood pressure (hypertension).  To monitor your blood pressure over time.  To make sure your blood pressure medicine is working.  Supplies needed: To take your blood pressure, you will need a blood pressure machine. You can buy a blood pressure machine, or blood pressure monitor, at most drugstores or online. There are several types of home blood pressure monitors. When choosing one, consider the following:  Choose a monitor that has an arm cuff.  Choose a monitor that wraps snugly around your upper arm. You should be able to fit only one finger between your arm and the cuff.  Do not choose a monitor that measures your blood pressure from your wrist or finger.  Your health care provider can suggest a reliable monitor that will meet your needs. How to prepare To get the most accurate reading, avoid the following for 30 minutes before you check your blood pressure:  Drinking caffeine.  Drinking alcohol.  Eating.  Smoking.  Exercising.  Five minutes before you check your blood pressure:  Empty your bladder.  Sit quietly without talking in a dining chair, rather than in a soft couch or armchair.  How to take your blood pressure To check your blood pressure, follow the instructions in the manual that came with your blood  pressure monitor. If you have a digital blood pressure monitor, the instructions may be as follows: 1. Sit up straight. 2. Place your feet on the floor. Do not cross your ankles or legs. 3. Rest your left arm at the level of your heart on a table or desk or on the arm of a chair. 4. Pull up your shirt sleeve. 5. Wrap the blood pressure cuff around the upper part of your left arm, 1 inch (2.5 cm) above your elbow. It is best to wrap the cuff around bare skin. 6. Fit the cuff snugly around your arm. You should be able to place only one finger between the cuff and your arm. 7. Position the cord inside the groove of  your elbow. 8. Press the power button. 9. Sit quietly while the cuff inflates and deflates. 10. Read the digital reading on the monitor screen and write it down (record it). 11. Wait 2-3 minutes, then repeat the steps, starting at step 1.  What does my blood pressure reading mean? A blood pressure reading consists of a higher number over a lower number. Ideally, your blood pressure should be below 120/80. The first ("top") number is called the systolic pressure. It is a measure of the pressure in your arteries as your heart beats. The second ("bottom") number is called the diastolic pressure. It is a measure of the pressure in your arteries as the heart relaxes. Blood pressure is classified into four stages. The following are the stages for adults who do not have a short-term serious illness or a chronic condition. Systolic pressure and diastolic pressure are measured in a unit called mm Hg. Normal  Systolic pressure: below 123456.  Diastolic pressure: below 80. Elevated  Systolic pressure: [id number].  Diastolic pressure: below 80. Hypertension stage 1  Systolic pressure: 0000000.  Diastolic pressure: [id number]. Hypertension stage 2  Systolic pressure: [id number] or above.  Diastolic pressure: 90 or above. You can have prehypertension or hypertension even if only the systolic or only the  diastolic number in your reading is higher than normal. Follow these instructions at home:  Check your blood pressure as often as recommended by your health care provider.  Take your monitor to the next appointment with your health care provider to make sure: ? That you are using it correctly. ? That it provides accurate readings.  Be sure you understand what your goal blood pressure numbers are.  Tell your health care provider if you are having any side effects from blood pressure medicine. Contact a health care provider if:  Your blood pressure is consistently high. Get help right away if:  Your systolic blood pressure is higher than 180.  Your diastolic blood pressure is higher than 110. This information is not intended to replace advice given to you by your health care provider. Make sure you discuss any questions you have with your health care provider. Document Released: 12/17/2015 Document Revised: 02/29/2016 Document Reviewed: 12/17/2015 Elsevier Interactive Patient Education  [employer].

## 2019-09-19 NOTE — Progress Notes (Signed)
Telehealth office visit note for Curtis Brennan, D.O- at Primary Care at [facility]   I connected with current patient today and verified that I am speaking with the correct person using two identifiers.   . Location of the patient: Home . Location of the provider: Office Only the patient (+/- their family members at pt's discretion) and myself were participating in the encounter - This visit type was conducted due to national recommendations for restrictions regarding the COVID-19 Pandemic (e.g. social distancing) in an effort to limit this patient's exposure and mitigate transmission in our community.  This format is felt to be most appropriate for this patient at this time.   - No physical exam could be performed with this format, beyond that communicated to [place] by the patient/ family members as noted.   - Additionally my office staff/ schedulers discussed with the patient that there may be a monetary charge related to this service, depending on their medical insurance.   The patient expressed understanding, and agreed to proceed.     _________________________________________________________________________________   History of Present Illness: Hypertension and Hyperlipidemia   I, Curtis Brennan, am serving as Education administrator for [employer].  Notes he's been okay since last OV.  - Shakiness in Morning Says when he gets up in the morning he feels "pretty normal, shaky."  Notes when he pours his coffee, if he's not careful, sometimes he will pour it all over himself.  - Prediabetes, Elevated Blood Glucose He consumes a vegan diet and notes "I live on sweets."  Says he's been "eating [sweets] like that all my life."  "I consider myself a vegetarian; I don't eat meats at all."  He eats eggs and cheese and does drink milk.  Denies excessive thirst or excessive urination.  - Vitamin D Deficiency He continues taking once-weekly.  HPI:  Hypertension:  Followed by Cardiology.  He  sees cardiology and the coumadin clinic, and last saw Jory Sims of Cardiology on 06/02/2019.  Thinks he returns to cardiology in 2 months.  -  His blood pressure at home has been running: "Usually runs a little bit elevated."  Notes he hasn't taken it lately.  He is unsure what his last measurement would've been.  - Patient reports good compliance with medication and/or lifestyle modification  - His denies acute concerns or problems related to treatment plan  - He denies new onset of: chest pain, exercise intolerance, shortness of breath, dizziness, visual changes, headache, lower extremity swelling or claudication.   Notes sometimes feels a little bit dizzy headed when he stands up too quickly.  Confirms that this is related to the valve of his heart.  This has not worsened lately.  Last 3 blood pressure readings in our office are as follows: BP Readings from Last 3 Encounters:  09/19/19 (!) 148/88  06/07/19 (!) 148/61  06/02/19 (!) 162/82   Filed Weights   09/19/19 1007  Weight: 189 lb (85.7 kg)        No flowsheet data found.  Depression screen [facility] 2/9 09/19/2019 01/14/2019 06/25/2018 05/07/2018  Decreased Interest 2 3 0 0  Down, Depressed, Hopeless 1 1 0 0  PHQ - 2 Score 3 4 0 0  Altered sleeping 0 0 0 0  Tired, decreased energy 3 3 2 2   Change in appetite 0 3 0 0  Feeling bad or failure about yourself  0 0 0 0  Trouble concentrating 0 2 0 0  Moving slowly or  fidgety/restless 0 0 0 0  Suicidal thoughts 0 0 0 0  PHQ-9 Score 6 12 2 2   Difficult doing work/chores Somewhat difficult Somewhat difficult Somewhat difficult Not difficult at all      Impression and Recommendations:    1. Prediabetes   2. Essential hypertension   3. S/P AVR (aortic valve replacement)   4. Vitamin D deficiency   5. B12 deficiency due to diet   6. Consumes a vegan diet      - Last OV 11/4.   Prediabetes - A1c 8 months ago was 6.3. - Re-check A1c next OV.  - Discussed  importance of monitoring patient's A1c closely to help prevent the patient from entering the diabetic range.  - Counseled patient on prevention of disease and discussed dietary and lifestyle modification as first line.    - Strongly encouraged patient to try to minimize his intake of carbohydrate-rich foods and sweets.  - Will continue to monitor.and re-check next OV as discussed.   Essential Hypertension - S/P AVR - Managed by cardiology.  - Blood pressure currently is elevated, but improved from prior. - Strongly advised patient to address his elevated BP readings with cardiology near future.   - Patient will continue current treatment regimen as established by cardiology. - Discussed critical importance of follow up and fine-tuning of plan with cardiology. - Continue treatment plan as established and follow up with all cardiology clinics as advised.  - Ambulatory blood pressure monitoring STRONGLY encouraged once per day.  Keep log and bring in every office visit, including to your visits with cardiology.  Reminded patient that if they ever feel poorly in any way, to check their blood pressure and pulse.  - With new intolerance to exercise, increasing dizziness, onset of chest pain, etc., advised patient to please be sure to call his cardiologist and update them ASAP regarding his symptoms.  - We will continue to monitor alongside specialist.   Consumes a Vegan Diet - B12 Deficiency due to Diet - Per patient, takes one B12 tablet weekly for supplementation. - Continue management as established.  See med list. - Will continue to monitor and re-check as discussed.   Vitamin D Deficiency - Per patient, takes 52 IU's Vitamin D daily. - Discussed beginning once weekly Vitamin D along with the once-weekly B12. - Discontinue daily Vitamin D and begin once-weekly supplementation. - Will continue to monitor and re-check as discussed.    Shakiness in Mornings - Discussed that  certain medications may increase patient's symptoms of occasional dizziness.  No prescription provided today. - Advised patient to keep a cane nearby and use this to stabilize himself if needed. - Patient knows to keep an eye on his symptoms and ambulate carefully. - Will continue to monitor.   Recommendations - Follow up in 4 months for in-office visit to check A1c.   - As part of my medical decision making, I reviewed the following data within the [place] History obtained from pt /family, CMA notes reviewed and incorporated if applicable, Labs reviewed, Radiograph/ tests reviewed if applicable and OV notes from prior OV's with me, as well as other specialists she/he has seen since seeing me last, were all reviewed and used in my medical decision making process today.    - Additionally, discussion had with patient regarding our treatment plan, and their biases/concerns about that plan were used in my medical decision making today.    - The patient agreed with the plan and demonstrated  an understanding of the instructions.   No barriers to understanding were identified.    - Red flag symptoms and signs discussed in detail.  Patient expressed understanding regarding what to do in case of emergency\ urgent symptoms.   - The patient was advised to call back or seek an in-person evaluation if the symptoms worsen or if the condition fails to improve as anticipated.   Return in 4 months (on 01/17/2020) for in-office A1c check, or obtain A1c the day prior to tele health OV if pt prefers.    Meds ordered this encounter  Medications  . Vitamin D, Ergocalciferol, (DRISDOL) 1.25 MG (50000 UNIT) CAPS capsule    Sig: Take one tablet wkly    Dispense:  12 capsule    Refill:  3    Medications Discontinued During This Encounter  Medication Reason  . Cholecalciferol (VITAMIN D) 50 MCG (2000 UT) CAPS      Time spent on visit including pre-visit chart review and post-visit care  was 18+ minutes.  Note:  This note was prepared with assistance of Dragon voice recognition software. Occasional wrong-word or sound-a-like substitutions may have occurred due to the inherent limitations of voice recognition software.   The [place] was signed into law in 2016 which includes the topic of electronic health records.  This provides immediate access to information in MyChart.  This includes consultation notes, operative notes, office notes, lab results and pathology reports.  If you have any questions about what you read please let us know at your next visit or call us at the office.  We are right here with you.  This document serves as a record of services personally performed by Curtis Dance, DO. It was created on her behalf by Curtis Brennan, a trained medical scribe. The creation of this record is based on the scribe's personal observations and the provider's statements to them.   This case required medical decision making of at least moderate complexity. The above documentation from Curtis Brennan, medical scribe, has been reviewed by Marjory Sneddon, D.O.    ______________________________________________________________________________     Patient Care Team    Relationship Specialty Notifications Start End  Esaw Grandchild, NP PCP - General Family Medicine  12/26/18   [name], MD PCP - Cardiology Cardiology Admissions 07/20/17   Grace Isaac, MD Consulting Physician Cardiothoracic Surgery  12/15/11   [name], MD Attending Physician Cardiology  12/15/11      -Vitals obtained; medications/ allergies reconciled;  personal medical, social, Sx etc.histories were updated by CMA, reviewed by me and are reflected in chart   Patient Active Problem List   Diagnosis Date Noted  . Statin intolerance 09/19/2019  . Bilateral carotid artery stenosis 09/19/2019  . Consumes a vegan diet 09/19/2019  . Prediabetes 09/19/2019  .  Vitamin D deficiency 09/19/2019  . B12 deficiency due to diet 09/19/2019  . Other fatigue 12/30/2018  . Weakness 12/30/2018  . Hypertension 06/25/2018  . Long term (current) use of anticoagulants 05/09/2018  . Healthcare maintenance 05/07/2018  . S/P AVR (aortic valve replacement)   . Coronary artery disease involving coronary bypass graft of native heart without angina pectoris   . Hx of pulmonary embolus   . Mixed hyperlipidemia   . TIA (transient ischemic attack) 04/28/2018  . Bilateral lower extremity edema 11/30/2017  . Chronic mastoiditis of right side 03/01/2016  . Dizziness 03/01/2016  . PAF s/p CABG/AVR 12/26/2011  . Unstable angina ([place]) 12/26/2011  .  COPD by CXR 12/16/2011  . Hx of CABG 12/15/2011  . AS, s/p tissue AVR 12/20/11 12/15/2011  . Chronic anticoagulation, on coumadin for pul emboli 12/15/2011  . Carotid artery disease, bilateral, asymptomatic 12/15/2011  . Dyslipidemia 10/28/2008  . Benign essential HTN 10/28/2008  . History of prior pulm embolism 10/28/2008  . PULMONARY HYPERTENSION 10/28/2008     Current Meds  Medication Sig  . acetaminophen (TYLENOL) 500 MG tablet Take 1 tablet (500 mg total) by mouth every 6 (six) hours as needed.  [name] aspirin EC 81 MG tablet Take 81 mg by mouth at bedtime.  . cetirizine (ZYRTEC) 5 MG tablet Take 1 tablet (5 mg total) by mouth daily.  . Cyanocobalamin (VITAMIN B12 PO) Take 1 tablet by mouth once a week.  . fluticasone (FLONASE) 50 MCG/ACT nasal spray Place 1 spray into both nostrils daily.  [name] lisinopril (ZESTRIL) 20 MG tablet TAKE 1 TABLET BY MOUTH DAILY  . warfarin (COUMADIN) 5 MG tablet TAKE 1/2 TO 1 TABLET BY MOUTH DAILY AS DIRECTED BY COUMADIN CLINIC  . [DISCONTINUED] Cholecalciferol (VITAMIN D) 50 MCG (2000 UT) CAPS Take 1 capsule by mouth daily.     Allergies:  Allergies  Allergen Reactions  . Atorvastatin Other (See Comments)    myalgias  . Sulfa Antibiotics Rash    Pt does not recall a rash from any  medication  . Sulfonamide Derivatives Rash    Pt does not recall a rash from any medication     ROS:  See above HPI for pertinent positives and negatives   Objective:   Blood pressure (!) 148/88, pulse 83, height 5\' 9"  (1.753 m), weight 189 lb (85.7 kg), SpO2 98 %.  (if some vitals are omitted, this means that patient was UNABLE to obtain them even though they were asked to get them prior to OV today.  They were asked to call us at their earliest convenience with these once obtained. )  General: A & O * 3; sounds in no acute distress; in usual state of health.  Skin: Pt confirms warm and dry extremities and pink fingertips HEENT: Pt confirms lips non-cyanotic Chest: Patient confirms normal chest excursion and movement Respiratory: speaking in full sentences, no conversational dyspnea; patient confirms no use of accessory muscles Psych: insight appears good, mood- appears full

## 2019-09-22 ENCOUNTER — Other Ambulatory Visit: Payer: Self-pay

## 2019-09-22 ENCOUNTER — Ambulatory Visit (INDEPENDENT_AMBULATORY_CARE_PROVIDER_SITE_OTHER): Payer: Medicare Other | Admitting: Pharmacist

## 2019-09-22 DIAGNOSIS — Z86711 Personal history of pulmonary embolism: Secondary | ICD-10-CM | POA: Diagnosis not present

## 2019-09-22 DIAGNOSIS — Z7901 Long term (current) use of anticoagulants: Secondary | ICD-10-CM

## 2019-09-22 DIAGNOSIS — Z952 Presence of prosthetic heart valve: Secondary | ICD-10-CM

## 2019-09-22 LAB — POCT INR: INR: 4.1 — AB (ref 2.0–3.0)

## 2019-10-06 ENCOUNTER — Telehealth: Payer: Self-pay | Admitting: Adult Health

## 2019-10-06 ENCOUNTER — Other Ambulatory Visit: Payer: Self-pay

## 2019-10-06 ENCOUNTER — Emergency Department (HOSPITAL_COMMUNITY)
Admission: EM | Admit: 2019-10-06 | Discharge: 2019-10-06 | Disposition: A | Discharge status: Home / Self Care | Payer: Medicare Other | Attending: Emergency Medicine | Admitting: Emergency Medicine

## 2019-10-06 ENCOUNTER — Encounter (HOSPITAL_COMMUNITY): Payer: Self-pay

## 2019-10-06 DIAGNOSIS — R040 Epistaxis: Secondary | ICD-10-CM

## 2019-10-06 DIAGNOSIS — Z954 Presence of other heart-valve replacement: Secondary | ICD-10-CM | POA: Insufficient documentation

## 2019-10-06 DIAGNOSIS — Z79899 Other long term (current) drug therapy: Secondary | ICD-10-CM | POA: Insufficient documentation

## 2019-10-06 DIAGNOSIS — Z7982 Long term (current) use of aspirin: Secondary | ICD-10-CM | POA: Diagnosis not present

## 2019-10-06 DIAGNOSIS — I2510 Atherosclerotic heart disease of native coronary artery without angina pectoris: Secondary | ICD-10-CM | POA: Insufficient documentation

## 2019-10-06 DIAGNOSIS — I10 Essential (primary) hypertension: Secondary | ICD-10-CM | POA: Insufficient documentation

## 2019-10-06 LAB — BASIC METABOLIC PANEL
Anion gap: 9 (ref 5–15)
BUN: 16 mg/dL (ref 8–23)
CO2: 27 mmol/L (ref 22–32)
Calcium: 9.1 mg/dL (ref 8.9–10.3)
Chloride: 102 mmol/L (ref 98–111)
Creatinine, Ser: 1.18 mg/dL (ref 0.61–1.24)
GFR calc Af Amer: >60 mL/min (ref >60)
GFR calc non Af Amer: 56 mL/min — ABNORMAL LOW (ref >60)
Glucose, Bld: 149 mg/dL — ABNORMAL HIGH (ref 70–99)
Potassium: 4.2 mmol/L (ref 3.5–5.1)
Sodium: 138 mmol/L (ref 135–145)

## 2019-10-06 LAB — CBC WITH DIFFERENTIAL/PLATELET
Abs Immature Granulocytes: 0.05 10*3/uL (ref 0.00–0.07)
Basophils Absolute: 0.1 10*3/uL (ref 0.0–0.1)
Basophils Relative: 1 %
Eosinophils Absolute: 0.2 10*3/uL (ref 0.0–0.5)
Eosinophils Relative: 3 %
HCT: 45 % (ref 39.0–52.0)
Hemoglobin: 15.1 g/dL (ref 13.0–17.0)
Immature Granulocytes: 1 %
Lymphocytes Relative: 25 %
Lymphs Abs: 1.5 10*3/uL (ref 0.7–4.0)
MCH: 33 pg (ref 26.0–34.0)
MCHC: 33.6 g/dL (ref 30.0–36.0)
MCV: 98.3 fL (ref 80.0–100.0)
Monocytes Absolute: 0.5 10*3/uL (ref 0.1–1.0)
Monocytes Relative: 8 %
Neutro Abs: 3.6 10*3/uL (ref 1.7–7.7)
Neutrophils Relative %: 62 %
Platelets: 183 10*3/uL (ref 150–400)
RBC: 4.58 MIL/uL (ref 4.22–5.81)
RDW: 12.8 % (ref 11.5–15.5)
WBC: 5.8 10*3/uL (ref 4.0–10.5)
nRBC: 0 % (ref 0.0–0.2)

## 2019-10-06 LAB — PROTIME-INR
INR: 2.1 — ABNORMAL HIGH (ref 0.8–1.2)
Prothrombin Time: 23.5 seconds — ABNORMAL HIGH (ref 11.4–15.2)

## 2019-10-06 MED ORDER — LIDOCAINE-EPINEPHRINE (PF) 2 %-1:200000 IJ SOLN
10.0000 mL | Freq: Once | INTRAMUSCULAR | Status: AC
  Administered 2019-10-06: 10 mL via INTRADERMAL
  Filled 2019-10-06: qty 20

## 2019-10-06 MED ORDER — OXYMETAZOLINE HCL 0.05 % NA SOLN
1.0000 | Freq: Once | NASAL | Status: AC
  Administered 2019-10-06: 12:00:00 1 via NASAL
  Filled 2019-10-06: qty 30

## 2019-10-06 MED ORDER — OXYMETAZOLINE HCL 0.05 % NA SOLN: 1.0000 | Freq: Once | NASAL | Status: DC

## 2019-10-06 NOTE — Telephone Encounter (Signed)
Patient's daughter Bethena Roys called (DPR on file). Patient has woke up everyday the past week with a bloody nose and is on blood thinners. She thinks that he needs this adjusted, I advised Bethena Roys that his blood thinner is managed by cards (Dr. Gwenlyn Found) and to reach out to their office about possibly adjusting meds. Bethena Roys wanted me to send a message to our clinic staff to see if we had any recommendations in the mean time, such a UC visit, etc.

## 2019-10-06 NOTE — Discharge Instructions (Signed)
Continue to use a humidifier in your bedroom.  Apply a area of bacitracin or Neosporin or Vaseline to the tip of your pinky and apply it just to the inside of your nose.  If you have recurrent bleeding please due to sprays of the nasal spray and hold pressure for 15 minutes without checking.  I have given you information to follow-up with your ear nose and throat doctor.

## 2019-10-06 NOTE — ED Provider Notes (Signed)
[facility] EMERGENCY DEPARTMENT Provider Note   CSN: [id number] Arrival date & time: 10/06/19  1129     History Chief Complaint  Patient presents with  . Epistaxis    Curtis Brennan is a 86 y.o. male.  86 yo M with a chief complaint of a nosebleed.  Patient states he has been waking up every morning for the past 6 days and had a nosebleed.  Normally was able to stop it with minimal pressure but today has not been able to stop it for about 3 to 4 hours.  Describes as dripping.  Right naris.  Denies trauma.  The history is provided by the patient.  Epistaxis Location:  R nare Severity:  Mild Duration:  6 days Timing:  Intermittent Progression:  Worsening Chronicity:  New Context: anticoagulants   Relieved by:  Nothing Worsened by:  Nothing Ineffective treatments:  Applying pressure and nasal tampon Associated symptoms: no congestion, no fever and no headaches        Past Medical History:  Diagnosis Date  . Angina   . AS (aortic stenosis) moderate to severe by cath 12/14/11 for AVR  12/15/2011  . CAD (coronary artery disease) with history of LAD & Diag athrectomy in 1994, now with severe 3 vesseal CAD by cath 12/14/11 for CABG 12/15/2011  . Carotid artery disease, bilateral 12/15/2011  . Chronic anticoagulation, on coumadin for pul emboli 12/15/2011  . Complication of anesthesia 07/1997   "just wouldn't wake wakeup"  . Coronary artery disease   . Exertional dyspnea   . Heart murmur   . Hypertension   . Kidney stones   . Legionnaire's disease ([place]) 1986  . Pneumonia    "once"  . PONV (postoperative nausea and vomiting)   . Prostate cancer ([place])   . Pulmonary embolism ([place]) ~ 2011   "both lungs"  . Unstable angina ([place]) 12/15/2011    Patient Active Problem List   Diagnosis Date Noted  . Statin intolerance 09/19/2019  . Bilateral carotid artery stenosis 09/19/2019  . Consumes a vegan diet 09/19/2019  . Prediabetes 09/19/2019  . Vitamin D  deficiency 09/19/2019  . B12 deficiency due to diet 09/19/2019  . Other fatigue 12/30/2018  . Weakness 12/30/2018  . Hypertension 06/25/2018  . Long term (current) use of anticoagulants 05/09/2018  . Healthcare maintenance 05/07/2018  . S/P AVR (aortic valve replacement)   . Coronary artery disease involving coronary bypass graft of native heart without angina pectoris   . Hx of pulmonary embolus   . Mixed hyperlipidemia   . TIA (transient ischemic attack) 04/28/2018  . Bilateral lower extremity edema 11/30/2017  . Chronic mastoiditis of right side 03/01/2016  . Dizziness 03/01/2016  . PAF s/p CABG/AVR 12/26/2011  . Unstable angina ([place]) 12/26/2011  . COPD by CXR 12/16/2011  . Hx of CABG 12/15/2011  . AS, s/p tissue AVR 12/20/11 12/15/2011  . Chronic anticoagulation, on coumadin for pul emboli 12/15/2011  . Carotid artery disease, bilateral, asymptomatic 12/15/2011  . Dyslipidemia 10/28/2008  . Benign essential HTN 10/28/2008  . History of prior pulm embolism 10/28/2008  . PULMONARY HYPERTENSION 10/28/2008    Past Surgical History:  Procedure Laterality Date  . AORTIC VALVE REPLACEMENT  12/20/2011   Procedure: AORTIC VALVE REPLACEMENT (AVR);  Surgeon: Grace Isaac, MD;  Location: [place];  Service: Open Heart Surgery;  Laterality: N/A;  . CARDIAC CATHETERIZATION  12/14/11  . CHOLECYSTECTOMY  2001  . CORONARY ANGIOPLASTY  1994   "roto rooter"  .  CORONARY ARTERY BYPASS GRAFT  12/20/2011   Procedure: CORONARY ARTERY BYPASS GRAFTING (CABG);  Surgeon: Grace Isaac, MD;  Location: [facility];  Service: Open Heart Surgery;  Laterality: N/A;  . CYSTOSCOPY  12/20/2011   Procedure: CYSTOSCOPY;  Surgeon: Fredricka Bonine, MD;  Location: [facility];  Service: Urology;  Laterality: N/A;  . gallstone removed  ~ 2010   "was lodged below pancreatis"  . LEFT HEART CATHETERIZATION WITH CORONARY ANGIOGRAM N/A 12/14/2011   Procedure: LEFT HEART CATHETERIZATION WITH CORONARY ANGIOGRAM;  Surgeon:  [name], MD;  Location: [facility] CATH LAB;  Service: Cardiovascular;  Laterality: N/A;  . LITHOTRIPSY  2006  . MASTOIDECTOMY Left   . PROSTATE SURGERY  07/1997  . RIGHT HEART CATHETERIZATION  12/14/2011   Procedure: RIGHT HEART CATH;  Surgeon: [name], MD;  Location: [facility] CATH LAB;  Service: Cardiovascular;;  . TYMPANIC MEMBRANE REPAIR Right        Family History  Problem Relation Age of Onset  . Cancer Mother        brain  . Cancer Sister        breast and pancreatic  . Alcohol abuse Brother   . Hypertension Brother   . Stroke Brother     Social History   Tobacco Use  . Smoking status: Never Smoker  . Smokeless tobacco: Never Used  Substance Use Topics  . Alcohol use: No  . Drug use: No    Home Medications Prior to Admission medications   Medication Sig Start Date End Date Taking? Authorizing Provider  acetaminophen (TYLENOL) 500 MG tablet Take 1 tablet (500 mg total) by mouth every 6 (six) hours as needed. 06/07/19   Loura Halt A, NP  aspirin EC 81 MG tablet Take 81 mg by mouth at bedtime.    [provider]  cetirizine (ZYRTEC) 5 MG tablet Take 1 tablet (5 mg total) by mouth daily. 06/07/19   Loura Halt A, NP  Cyanocobalamin (VITAMIN B12 PO) Take 1 tablet by mouth once a week.    [provider]  fluticasone (FLONASE) 50 MCG/ACT nasal spray Place 1 spray into both nostrils daily. 06/07/19   Loura Halt A, NP  isosorbide mononitrate (IMDUR) 30 MG 24 hr tablet  06/11/19   [provider]  lisinopril (ZESTRIL) 20 MG tablet TAKE 1 TABLET BY MOUTH DAILY 04/21/19   [name], MD  metoprolol succinate (TOPROL-XL) 50 MG 24 hr tablet Take 1 tablet (50 mg total) by mouth daily. Take with or immediately following a meal. 06/02/19 08/31/19  Lendon Colonel, NP  Vitamin D, Ergocalciferol, (DRISDOL) 1.25 MG (50000 UNIT) CAPS capsule Take one tablet wkly 09/19/19   Opalski, Deborah, DO  warfarin (COUMADIN) 5 MG tablet TAKE 1/2 TO 1 TABLET BY  MOUTH DAILY AS DIRECTED BY COUMADIN CLINIC 08/11/19   [name], MD    Allergies    Atorvastatin, Sulfa antibiotics, and Sulfonamide derivatives  Review of Systems   Review of Systems  Constitutional: Negative for chills and fever.  HENT: Positive for nosebleeds. Negative for congestion and facial swelling.   Eyes: Negative for discharge and visual disturbance.  Respiratory: Negative for shortness of breath.   Cardiovascular: Negative for chest pain and palpitations.  Gastrointestinal: Negative for abdominal pain, diarrhea and vomiting.  Musculoskeletal: Negative for arthralgias and myalgias.  Skin: Negative for color change and rash.  Neurological: Negative for tremors, syncope and headaches.  Psychiatric/Behavioral: Negative for confusion and dysphoric mood.    Physical  Exam Updated Vital Signs BP (!) 161/73   Pulse 60   Temp 97.7 F (36.5 C) (Oral)   Resp 18   Ht 5\' 9"  (1.753 m)   Wt 90.3 kg   SpO2 100%   BMI 29.39 kg/m   Physical Exam Vitals and nursing note reviewed.  Constitutional:      Appearance: He is well-developed.  HENT:     Head: Normocephalic and atraumatic.     Comments: Blood from the right naris Eyes:     Pupils: Pupils are equal, round, and reactive to light.  Neck:     Vascular: No JVD.  Cardiovascular:     Rate and Rhythm: Normal rate and regular rhythm.     Heart sounds: No murmur. No friction rub. No gallop.   Pulmonary:     Effort: No respiratory distress.     Breath sounds: No wheezing.  Abdominal:     General: There is no distension.     Tenderness: There is no abdominal tenderness. There is no guarding or rebound.  Musculoskeletal:        General: Normal range of motion.     Cervical back: Normal range of motion and neck supple.  Skin:    Coloration: Skin is not pale.     Findings: No rash.  Neurological:     Mental Status: He is alert and oriented to person, place, and time.  Psychiatric:        Behavior: Behavior  normal.     ED Results / Procedures / Treatments   Labs (all labs ordered are listed, but only abnormal results are displayed) Labs Reviewed  BASIC METABOLIC PANEL - Abnormal; Notable for the following components:      Result Value   Glucose, Bld 149 (*)    GFR calc non Af Amer 56 (*)    All other components within normal limits  PROTIME-INR - Abnormal; Notable for the following components:   Prothrombin Time 23.5 (*)    INR 2.1 (*)    All other components within normal limits  CBC WITH DIFFERENTIAL/PLATELET    EKG None  Radiology No results found.  Procedures .Epistaxis Management  Date/Time: 10/06/2019 1:30 PM Performed by: Deno Etienne, DO Authorized by: Deno Etienne, DO   Consent:    Consent obtained:  Verbal   Consent given by:  Patient   Risks discussed:  Bleeding, infection and nasal injury   Alternatives discussed:  No treatment, delayed treatment and alternative treatment Anesthesia (see MAR for exact dosages):    Anesthesia method:  Topical application   Topical anesthesia: Lidocaine and epinephrine. Procedure details:    Treatment site:  R anterior   Treatment method:  Silver nitrate   Treatment complexity:  Limited   Treatment episode: recurring   Post-procedure details:    Assessment:  Bleeding stopped   Patient tolerance of procedure:  Tolerated well, no immediate complications Comments:     Patient with a right anterior nosebleed.  Visualized on exam.  Stopped initially with cautery though had a recurrence while in the ED.  Repeat treatment without obvious continued bleeding.   (including critical care time)  Medications Ordered in ED Medications  oxymetazoline (AFRIN) 0.05 % nasal spray 1 spray (1 spray Each Nare Not Given 10/06/19 1154)  lidocaine-EPINEPHrine (XYLOCAINE W/EPI) 2 %-1:200000 (PF) injection 10 mL (10 mLs Intradermal Given by Other 10/06/19 1153)  oxymetazoline (AFRIN) 0.05 % nasal spray 1 spray (1 spray Each Nare Given by Other 10/06/19  1154)  ED Course  I have reviewed the triage vital signs and the nursing notes.  Pertinent labs & imaging results that were available during my care of the patient were reviewed by me and considered in my medical decision making (see chart for details).    MDM Rules/Calculators/A&P                      86 yo M with a chief complaint of a nosebleed.  Going on for the past 6 days.  On Coumadin for valve replacement.  Goal is between 1.5 and 2.5.  Will perform local treatment reassess.  Was able to stop the bleeding initially with silver nitrate.  On reassessment the patient had some mild bleeding through this.  Was recauterized.  Offered to continue to observe the patient in the ED but elected to go home at this time.  Reportedly the patient was bradycardic though he had not been bradycardic at any of the prior vital sign checks or my exam.  Asymptomatic.  We will have him follow-up with his family doctor.  1:32 PM:  I have discussed the diagnosis/risks/treatment options with the patient and believe the pt to be eligible for discharge home to follow-up with PCP. We also discussed returning to the ED immediately if new or worsening sx occur. We discussed the sx which are most concerning (e.g., sudden worsening pain, fever, inability to tolerate by mouth) that necessitate immediate return. Medications administered to the patient during their visit and any new prescriptions provided to the patient are listed below.  Medications given during this visit Medications  oxymetazoline (AFRIN) 0.05 % nasal spray 1 spray (1 spray Each Nare Not Given 10/06/19 1154)  lidocaine-EPINEPHrine (XYLOCAINE W/EPI) 2 %-1:200000 (PF) injection 10 mL (10 mLs Intradermal Given by Other 10/06/19 1153)  oxymetazoline (AFRIN) 0.05 % nasal spray 1 spray (1 spray Each Nare Given by Other 10/06/19 1154)     The patient appears reasonably screen and/or stabilized for discharge and I doubt any other medical condition or other  [facility] requiring further screening, evaluation, or treatment in the ED at this time prior to discharge.   Final Clinical Impression(s) / ED Diagnoses Final diagnoses:  Right-sided epistaxis    Rx / DC Orders ED Discharge Orders    None       Deno Etienne, DO 10/06/19 1332

## 2019-10-06 NOTE — ED Triage Notes (Signed)
Pt reports nose bleed x6 days. He has been able to control the bleeding until today. Pt has tissue in Right nostril. Pt states he's on blood thinners

## 2019-10-06 NOTE — Telephone Encounter (Signed)
Called Bethena Roys back and was unable to get in touch with her. Called patient house and spoke with Zigmund Daniel and she states patient and his daughter are on the way to ED for evaluation. AS, CMA

## 2019-10-10 ENCOUNTER — Other Ambulatory Visit: Payer: Self-pay

## 2019-10-10 ENCOUNTER — Emergency Department (HOSPITAL_COMMUNITY)
Admission: EM | Admit: 2019-10-10 | Discharge: 2019-10-10 | Disposition: A | Discharge status: Home / Self Care | Payer: Medicare Other | Attending: Emergency Medicine | Admitting: Emergency Medicine

## 2019-10-10 ENCOUNTER — Encounter (HOSPITAL_COMMUNITY): Payer: Self-pay | Admitting: Emergency Medicine

## 2019-10-10 DIAGNOSIS — I10 Essential (primary) hypertension: Secondary | ICD-10-CM | POA: Insufficient documentation

## 2019-10-10 DIAGNOSIS — I2510 Atherosclerotic heart disease of native coronary artery without angina pectoris: Secondary | ICD-10-CM | POA: Diagnosis not present

## 2019-10-10 DIAGNOSIS — Z951 Presence of aortocoronary bypass graft: Secondary | ICD-10-CM | POA: Diagnosis not present

## 2019-10-10 DIAGNOSIS — Z7901 Long term (current) use of anticoagulants: Secondary | ICD-10-CM | POA: Insufficient documentation

## 2019-10-10 DIAGNOSIS — Z79899 Other long term (current) drug therapy: Secondary | ICD-10-CM | POA: Diagnosis not present

## 2019-10-10 DIAGNOSIS — R040 Epistaxis: Secondary | ICD-10-CM | POA: Diagnosis not present

## 2019-10-10 DIAGNOSIS — Z9049 Acquired absence of other specified parts of digestive tract: Secondary | ICD-10-CM | POA: Diagnosis not present

## 2019-10-10 DIAGNOSIS — Z9861 Coronary angioplasty status: Secondary | ICD-10-CM | POA: Insufficient documentation

## 2019-10-10 DIAGNOSIS — Z952 Presence of prosthetic heart valve: Secondary | ICD-10-CM | POA: Diagnosis not present

## 2019-10-10 DIAGNOSIS — Z8546 Personal history of malignant neoplasm of prostate: Secondary | ICD-10-CM | POA: Diagnosis not present

## 2019-10-10 DIAGNOSIS — Z7982 Long term (current) use of aspirin: Secondary | ICD-10-CM | POA: Insufficient documentation

## 2019-10-10 LAB — CBC
HCT: 43 % (ref 39.0–52.0)
Hemoglobin: 14.5 g/dL (ref 13.0–17.0)
MCH: 33 pg (ref 26.0–34.0)
MCHC: 33.7 g/dL (ref 30.0–36.0)
MCV: 97.7 fL (ref 80.0–100.0)
Platelets: 183 10*3/uL (ref 150–400)
RBC: 4.4 MIL/uL (ref 4.22–5.81)
RDW: 12.9 % (ref 11.5–15.5)
WBC: 4.9 10*3/uL (ref 4.0–10.5)
nRBC: 0 % (ref 0.0–0.2)

## 2019-10-10 LAB — PROTIME-INR
INR: 2.3 — ABNORMAL HIGH (ref 0.8–1.2)
Prothrombin Time: 25.1 seconds — ABNORMAL HIGH (ref 11.4–15.2)

## 2019-10-10 MED ORDER — OXYMETAZOLINE HCL 0.05 % NA SOLN
2.0000 | Freq: Two times a day (BID) | NASAL | Status: DC
  Administered 2019-10-10 (×2): 2 via NASAL
  Filled 2019-10-10: qty 30

## 2019-10-10 NOTE — ED Provider Notes (Signed)
[facility] EMERGENCY DEPARTMENT Provider Note   CSN: [id number] Arrival date & time: 10/10/19  1120     History Chief Complaint  Patient presents with  . Epistaxis    Curtis Brennan is a 86 y.o. male.  Patient is an 85 year old male with history of coronary artery disease, CABG, aortic valve disease on Coumadin.  He presents today for evaluation of nosebleed.  He was seen 3 days ago with a similar complaint.  Silver nitrate cautery was performed and the bleeding was stopped.  Patient returns today with ongoing intermittent bleeding that seems to be present in the morning, then is fine throughout the day.  He has been using a humidifier at night with no improvement.  He denies any injury or trauma.  The history is provided by the patient.  Epistaxis Location:  R nare Severity:  Moderate Duration:  1 week Timing:  Intermittent Progression:  Waxing and waning Chronicity:  New Context: anticoagulants   Relieved by:  Nothing Worsened by:  Nothing Ineffective treatments:  None tried Associated symptoms: no fever        Past Medical History:  Diagnosis Date  . Angina   . AS (aortic stenosis) moderate to severe by cath 12/14/11 for AVR  12/15/2011  . CAD (coronary artery disease) with history of LAD & Diag athrectomy in 1994, now with severe 3 vesseal CAD by cath 12/14/11 for CABG 12/15/2011  . Carotid artery disease, bilateral 12/15/2011  . Chronic anticoagulation, on coumadin for pul emboli 12/15/2011  . Complication of anesthesia 07/1997   "just wouldn't wake wakeup"  . Coronary artery disease   . Exertional dyspnea   . Heart murmur   . Hypertension   . Kidney stones   . Legionnaire's disease ([place]) 1986  . Pneumonia    "once"  . PONV (postoperative nausea and vomiting)   . Prostate cancer ([place])   . Pulmonary embolism ([place]) ~ 2011   "both lungs"  . Unstable angina ([place]) 12/15/2011    Patient Active Problem List   Diagnosis Date Noted  . Statin  intolerance 09/19/2019  . Bilateral carotid artery stenosis 09/19/2019  . Consumes a vegan diet 09/19/2019  . Prediabetes 09/19/2019  . Vitamin D deficiency 09/19/2019  . B12 deficiency due to diet 09/19/2019  . Other fatigue 12/30/2018  . Weakness 12/30/2018  . Hypertension 06/25/2018  . Long term (current) use of anticoagulants 05/09/2018  . Healthcare maintenance 05/07/2018  . S/P AVR (aortic valve replacement)   . Coronary artery disease involving coronary bypass graft of native heart without angina pectoris   . Hx of pulmonary embolus   . Mixed hyperlipidemia   . TIA (transient ischemic attack) 04/28/2018  . Bilateral lower extremity edema 11/30/2017  . Chronic mastoiditis of right side 03/01/2016  . Dizziness 03/01/2016  . PAF s/p CABG/AVR 12/26/2011  . Unstable angina ([place]) 12/26/2011  . COPD by CXR 12/16/2011  . Hx of CABG 12/15/2011  . AS, s/p tissue AVR 12/20/11 12/15/2011  . Chronic anticoagulation, on coumadin for pul emboli 12/15/2011  . Carotid artery disease, bilateral, asymptomatic 12/15/2011  . Dyslipidemia 10/28/2008  . Benign essential HTN 10/28/2008  . History of prior pulm embolism 10/28/2008  . PULMONARY HYPERTENSION 10/28/2008    Past Surgical History:  Procedure Laterality Date  . AORTIC VALVE REPLACEMENT  12/20/2011   Procedure: AORTIC VALVE REPLACEMENT (AVR);  Surgeon: Grace Isaac, MD;  Location: [facility];  Service: Open Heart Surgery;  Laterality: N/A;  . CARDIAC CATHETERIZATION  12/14/11  . CHOLECYSTECTOMY  2001  . CORONARY ANGIOPLASTY  1994   "roto rooter"  . CORONARY ARTERY BYPASS GRAFT  12/20/2011   Procedure: CORONARY ARTERY BYPASS GRAFTING (CABG);  Surgeon: Grace Isaac, MD;  Location: [facility];  Service: Open Heart Surgery;  Laterality: N/A;  . CYSTOSCOPY  12/20/2011   Procedure: CYSTOSCOPY;  Surgeon: Fredricka Bonine, MD;  Location: [place];  Service: Urology;  Laterality: N/A;  . gallstone removed  ~ 2010   "was lodged below  pancreatis"  . LEFT HEART CATHETERIZATION WITH CORONARY ANGIOGRAM N/A 12/14/2011   Procedure: LEFT HEART CATHETERIZATION WITH CORONARY ANGIOGRAM;  Surgeon: [name], MD;  Location: [facility] CATH LAB;  Service: Cardiovascular;  Laterality: N/A;  . LITHOTRIPSY  2006  . MASTOIDECTOMY Left   . PROSTATE SURGERY  07/1997  . RIGHT HEART CATHETERIZATION  12/14/2011   Procedure: RIGHT HEART CATH;  Surgeon: [name], MD;  Location: [facility] CATH LAB;  Service: Cardiovascular;;  . TYMPANIC MEMBRANE REPAIR Right        Family History  Problem Relation Age of Onset  . Cancer Mother        brain  . Cancer Sister        breast and pancreatic  . Alcohol abuse Brother   . Hypertension Brother   . Stroke Brother     Social History   Tobacco Use  . Smoking status: Never Smoker  . Smokeless tobacco: Never Used  Substance Use Topics  . Alcohol use: No  . Drug use: No    Home Medications Prior to Admission medications   Medication Sig Start Date End Date Taking? Authorizing Provider  acetaminophen (TYLENOL) 500 MG tablet Take 1 tablet (500 mg total) by mouth every 6 (six) hours as needed. 06/07/19   Loura Halt A, NP  aspirin EC 81 MG tablet Take 81 mg by mouth at bedtime.    [provider]  cetirizine (ZYRTEC) 5 MG tablet Take 1 tablet (5 mg total) by mouth daily. 06/07/19   Loura Halt A, NP  Cyanocobalamin (VITAMIN B12 PO) Take 1 tablet by mouth once a week.    [provider]  fluticasone (FLONASE) 50 MCG/ACT nasal spray Place 1 spray into both nostrils daily. 06/07/19   Loura Halt A, NP  isosorbide mononitrate (IMDUR) 30 MG 24 hr tablet  06/11/19   [provider]  lisinopril (ZESTRIL) 20 MG tablet TAKE 1 TABLET BY MOUTH DAILY 04/21/19   [name], MD  metoprolol succinate (TOPROL-XL) 50 MG 24 hr tablet Take 1 tablet (50 mg total) by mouth daily. Take with or immediately following a meal. 06/02/19 08/31/19  Lendon Colonel, NP  Vitamin D,  Ergocalciferol, (DRISDOL) 1.25 MG (50000 UNIT) CAPS capsule Take one tablet wkly 09/19/19   Opalski, Deborah, DO  warfarin (COUMADIN) 5 MG tablet TAKE 1/2 TO 1 TABLET BY MOUTH DAILY AS DIRECTED BY COUMADIN CLINIC 08/11/19   [name], MD    Allergies    Atorvastatin, Sulfa antibiotics, and Sulfonamide derivatives  Review of Systems   Review of Systems  Constitutional: Negative for fever.  HENT: Positive for nosebleeds.   All other systems reviewed and are negative.   Physical Exam Updated Vital Signs BP (!) 145/110   Pulse 77   Temp 97.6 F (36.4 C) (Oral)   Resp 14   Ht 5\' 9"  (1.753 m)   Wt 90 kg   SpO2 99%   BMI 29.30 kg/m   Physical  Exam Vitals and nursing note reviewed.  Constitutional:      General: He is not in acute distress.    Appearance: He is well-developed. He is not diaphoretic.  HENT:     Head: Normocephalic and atraumatic.     Nose:     Comments: The right nares appears grossly normal.  There is some old blood present, however no active bleeding and no obvious site of bleeding is noted.  Septum is midline. Cardiovascular:     Rate and Rhythm: Normal rate and regular rhythm.     Heart sounds: No murmur. No friction rub.  Pulmonary:     Effort: Pulmonary effort is normal. No respiratory distress.     Breath sounds: Normal breath sounds. No wheezing or rales.  Abdominal:     General: Bowel sounds are normal. There is no distension.     Palpations: Abdomen is soft.     Tenderness: There is no abdominal tenderness.  Musculoskeletal:        General: Normal range of motion.     Cervical back: Normal range of motion and neck supple.  Skin:    General: Skin is warm and dry.  Neurological:     Mental Status: He is alert and oriented to person, place, and time.     Coordination: Coordination normal.     ED Results / Procedures / Treatments   Labs (all labs ordered are listed, but only abnormal results are displayed) Labs Reviewed  PROTIME-INR -  Abnormal; Notable for the following components:      Result Value   Prothrombin Time 25.1 (*)    INR 2.3 (*)    All other components within normal limits  CBC    EKG None  Radiology No results found.  Procedures Procedures (including critical care time)  Medications Ordered in ED Medications  oxymetazoline (AFRIN) 0.05 % nasal spray 2 spray (2 sprays Right Nare Given 10/10/19 1248)    ED Course  I have reviewed the triage vital signs and the nursing notes.  Pertinent labs & imaging results that were available during my care of the patient were reviewed by me and considered in my medical decision making (see chart for details).    MDM Rules/Calculators/A&P  Patient presenting here with intermittent nosebleeds for the past week while on Coumadin.  These seem to occur in the morning.  He called the ENT clinic this morning and told them he was bleeding, then was told to come here to the ER.  By the time he arrived here it has stopped.  I see no obvious source of his bleeding and everything to my exam appears unremarkable.  Patient will be given Afrin nasal spray and advised to follow-up with ENT at 4:00 this afternoon as scheduled.  His INR was repeated today and is 2.4.  Final Clinical Impression(s) / ED Diagnoses Final diagnoses:  None    Rx / DC Orders ED Discharge Orders    None       Veryl Speak, MD 10/10/19 1324

## 2019-10-10 NOTE — ED Triage Notes (Signed)
Pt reports nosebleeds every morning but it is controlled at this time. States he was given a number here and tried to call it but they sent him back here. Pt on blood thinners.

## 2019-10-10 NOTE — Discharge Instructions (Addendum)
Use Afrin nasal spray: 2 sprays twice daily in the right nostril.  Follow-up with ENT this afternoon as scheduled.  Continue to use humidifier at night.

## 2019-10-13 ENCOUNTER — Ambulatory Visit (INDEPENDENT_AMBULATORY_CARE_PROVIDER_SITE_OTHER): Payer: Medicare Other | Admitting: Pharmacist

## 2019-10-13 ENCOUNTER — Other Ambulatory Visit: Payer: Self-pay

## 2019-10-13 DIAGNOSIS — Z952 Presence of prosthetic heart valve: Secondary | ICD-10-CM | POA: Diagnosis not present

## 2019-10-13 DIAGNOSIS — Z86711 Personal history of pulmonary embolism: Secondary | ICD-10-CM | POA: Diagnosis not present

## 2019-10-13 DIAGNOSIS — Z7901 Long term (current) use of anticoagulants: Secondary | ICD-10-CM | POA: Diagnosis not present

## 2019-10-13 LAB — POCT INR: INR: 2.6 (ref 2.0–3.0)

## 2019-10-20 ENCOUNTER — Other Ambulatory Visit: Payer: Self-pay

## 2019-10-20 ENCOUNTER — Emergency Department (HOSPITAL_COMMUNITY)
Admission: EM | Admit: 2019-10-20 | Discharge: 2019-10-23 | Disposition: E | Payer: Medicare Other | Attending: Emergency Medicine | Admitting: Emergency Medicine

## 2019-10-20 ENCOUNTER — Emergency Department (HOSPITAL_COMMUNITY): Payer: Medicare Other

## 2019-10-20 DIAGNOSIS — X58XXXA Exposure to other specified factors, initial encounter: Secondary | ICD-10-CM | POA: Insufficient documentation

## 2019-10-20 DIAGNOSIS — Y9389 Activity, other specified: Secondary | ICD-10-CM | POA: Insufficient documentation

## 2019-10-20 DIAGNOSIS — R52 Pain, unspecified: Secondary | ICD-10-CM | POA: Diagnosis not present

## 2019-10-20 DIAGNOSIS — Z20822 Contact with and (suspected) exposure to covid-19: Secondary | ICD-10-CM | POA: Diagnosis not present

## 2019-10-20 DIAGNOSIS — Z7901 Long term (current) use of anticoagulants: Secondary | ICD-10-CM | POA: Insufficient documentation

## 2019-10-20 DIAGNOSIS — R4020 Unspecified coma: Secondary | ICD-10-CM | POA: Diagnosis not present

## 2019-10-20 DIAGNOSIS — I2510 Atherosclerotic heart disease of native coronary artery without angina pectoris: Secondary | ICD-10-CM | POA: Diagnosis not present

## 2019-10-20 DIAGNOSIS — Z743 Need for continuous supervision: Secondary | ICD-10-CM | POA: Diagnosis not present

## 2019-10-20 DIAGNOSIS — S12100A Unspecified displaced fracture of second cervical vertebra, initial encounter for closed fracture: Secondary | ICD-10-CM | POA: Diagnosis not present

## 2019-10-20 DIAGNOSIS — Z79899 Other long term (current) drug therapy: Secondary | ICD-10-CM | POA: Insufficient documentation

## 2019-10-20 DIAGNOSIS — Z952 Presence of prosthetic heart valve: Secondary | ICD-10-CM | POA: Insufficient documentation

## 2019-10-20 DIAGNOSIS — Z03818 Encounter for observation for suspected exposure to other biological agents ruled out: Secondary | ICD-10-CM | POA: Diagnosis not present

## 2019-10-20 DIAGNOSIS — S0990XA Unspecified injury of head, initial encounter: Secondary | ICD-10-CM | POA: Diagnosis not present

## 2019-10-20 DIAGNOSIS — Z951 Presence of aortocoronary bypass graft: Secondary | ICD-10-CM | POA: Diagnosis not present

## 2019-10-20 DIAGNOSIS — I499 Cardiac arrhythmia, unspecified: Secondary | ICD-10-CM | POA: Diagnosis not present

## 2019-10-20 DIAGNOSIS — I469 Cardiac arrest, cause unspecified: Secondary | ICD-10-CM | POA: Insufficient documentation

## 2019-10-20 DIAGNOSIS — J449 Chronic obstructive pulmonary disease, unspecified: Secondary | ICD-10-CM | POA: Insufficient documentation

## 2019-10-20 DIAGNOSIS — S12112A Nondisplaced Type II dens fracture, initial encounter for closed fracture: Secondary | ICD-10-CM | POA: Diagnosis not present

## 2019-10-20 DIAGNOSIS — Y9289 Other specified places as the place of occurrence of the external cause: Secondary | ICD-10-CM | POA: Insufficient documentation

## 2019-10-20 DIAGNOSIS — R404 Transient alteration of awareness: Secondary | ICD-10-CM | POA: Diagnosis not present

## 2019-10-20 DIAGNOSIS — I119 Hypertensive heart disease without heart failure: Secondary | ICD-10-CM | POA: Insufficient documentation

## 2019-10-20 DIAGNOSIS — Y999 Unspecified external cause status: Secondary | ICD-10-CM | POA: Diagnosis not present

## 2019-10-20 DIAGNOSIS — J984 Other disorders of lung: Secondary | ICD-10-CM | POA: Diagnosis not present

## 2019-10-20 DIAGNOSIS — R55 Syncope and collapse: Secondary | ICD-10-CM | POA: Diagnosis not present

## 2019-10-20 LAB — COMPREHENSIVE METABOLIC PANEL
ALT: 313 U/L — ABNORMAL HIGH (ref 0–44)
AST: 356 U/L — ABNORMAL HIGH (ref 15–41)
Albumin: 3 g/dL — ABNORMAL LOW (ref 3.5–5.0)
Alkaline Phosphatase: 58 U/L (ref 38–126)
Anion gap: 20 — ABNORMAL HIGH (ref 5–15)
BUN: 13 mg/dL (ref 8–23)
CO2: 13 mmol/L — ABNORMAL LOW (ref 22–32)
Calcium: 8.7 mg/dL — ABNORMAL LOW (ref 8.9–10.3)
Chloride: 104 mmol/L (ref 98–111)
Creatinine, Ser: 1.96 mg/dL — ABNORMAL HIGH (ref 0.61–1.24)
GFR calc Af Amer: 35 mL/min — ABNORMAL LOW (ref >60)
GFR calc non Af Amer: 30 mL/min — ABNORMAL LOW (ref >60)
Glucose, Bld: 262 mg/dL — ABNORMAL HIGH (ref 70–99)
Potassium: 3.4 mmol/L — ABNORMAL LOW (ref 3.5–5.1)
Sodium: 137 mmol/L (ref 135–145)
Total Bilirubin: 1.1 mg/dL (ref 0.3–1.2)
Total Protein: 5.5 g/dL — ABNORMAL LOW (ref 6.5–8.1)

## 2019-10-20 LAB — CBC WITH DIFFERENTIAL/PLATELET
Abs Immature Granulocytes: 0.75 10*3/uL — ABNORMAL HIGH (ref 0.00–0.07)
Basophils Absolute: 0.1 10*3/uL (ref 0.0–0.1)
Basophils Relative: 1 %
Eosinophils Absolute: 0.1 10*3/uL (ref 0.0–0.5)
Eosinophils Relative: 1 %
HCT: 45.6 % (ref 39.0–52.0)
Hemoglobin: 13.9 g/dL (ref 13.0–17.0)
Immature Granulocytes: 9 %
Lymphocytes Relative: 43 %
Lymphs Abs: 3.8 10*3/uL (ref 0.7–4.0)
MCH: 32.6 pg (ref 26.0–34.0)
MCHC: 30.5 g/dL (ref 30.0–36.0)
MCV: 106.8 fL — ABNORMAL HIGH (ref 80.0–100.0)
Monocytes Absolute: 0.3 10*3/uL (ref 0.1–1.0)
Monocytes Relative: 3 %
Neutro Abs: 3.8 10*3/uL (ref 1.7–7.7)
Neutrophils Relative %: 43 %
Platelets: 145 10*3/uL — ABNORMAL LOW (ref 150–400)
RBC: 4.27 MIL/uL (ref 4.22–5.81)
RDW: 12.7 % (ref 11.5–15.5)
WBC: 8.7 10*3/uL (ref 4.0–10.5)
nRBC: 0.8 % — ABNORMAL HIGH (ref 0.0–0.2)

## 2019-10-20 LAB — MAGNESIUM: Magnesium: 2.4 mg/dL (ref 1.7–2.4)

## 2019-10-20 LAB — TROPONIN I (HIGH SENSITIVITY): Troponin I (High Sensitivity): 197 ng/L (ref <18)

## 2019-10-20 LAB — RESPIRATORY PANEL BY RT PCR (FLU A&B, COVID)
Influenza A by PCR: NEGATIVE
Influenza B by PCR: NEGATIVE
SARS Coronavirus 2 by RT PCR: NEGATIVE

## 2019-10-20 LAB — PROTIME-INR
INR: 3.2 — ABNORMAL HIGH (ref 0.8–1.2)
Prothrombin Time: 33 seconds — ABNORMAL HIGH (ref 11.4–15.2)

## 2019-10-20 LAB — LACTIC ACID, PLASMA: Lactic Acid, Venous: >11 mmol/L (ref 0.5–1.9)

## 2019-10-20 LAB — CBG MONITORING, ED: Glucose-Capillary: 120 mg/dL — ABNORMAL HIGH (ref 70–99)

## 2019-10-20 MED ORDER — EPINEPHRINE 1 MG/10ML IJ SOSY
PREFILLED_SYRINGE | INTRAMUSCULAR | Status: AC | PRN
  Administered 2019-10-20 (×2): 1 mg via INTRAVENOUS

## 2019-10-20 MED ORDER — EPINEPHRINE HCL 5 MG/250ML IV SOLN IN NS
0.5000 ug/min | INTRAVENOUS | Status: DC
  Administered 2019-10-20: 0.5 ug/min via INTRAVENOUS
  Filled 2019-10-20: qty 250

## 2019-10-20 MED ORDER — EPINEPHRINE 1 MG/10ML IJ SOSY
PREFILLED_SYRINGE | INTRAMUSCULAR | Status: AC | PRN
  Administered 2019-10-20: 1 mg via INTRAVENOUS

## 2019-10-21 MED FILL — Medication: Qty: 1 | Status: AC

## 2019-10-23 NOTE — Progress Notes (Signed)
Chaplain provided grief support and prayer and assisted with visitation; facilitated communication with medical team.   NOK  Curtis Brennan (wife) [address] [place], [place] [address] [phone]  FH: [place] [facility] Barbarann Ehlers, [place] NOTE:  Family said patient's body will be donated to [place], [facility] has all the paperwork.   [name] [id number]    11-17-19 1900  Clinical Encounter Type  Visited With Patient and family together  Visit Type Initial;Critical Care;Death  Referral From Nurse  Consult/Referral To Chaplain  Spiritual Encounters  Spiritual Needs Prayer;Grief support  Stress Factors  Family Stress Factors Loss;Major life changes
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
## 2019-10-23 NOTE — ED Notes (Signed)
Patient time of death occurred at 4.
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
## 2019-10-23 DEATH — deceased

## 2020-08-04 IMAGING — CT CT HEAD W/O CM
4 series · 15 of 47 positions shown, 17 images · non-contrast
Comparison: Head CT scan 07/09/2009.

CLINICAL DATA: Acute onset confusion, memory loss and facial droop
at [DATE] p.m. today.

EXAM:
CT HEAD WITHOUT CONTRAST
TECHNIQUE: Contiguous axial images were obtained from the base of the skull
through the vertex without intravenous contrast.

[Series 3: head wo · axial · 0.59mm/px · z∈[-90,+30]mm · 7 of 33 slices shown, 9 images]
[im 5/33  brain]
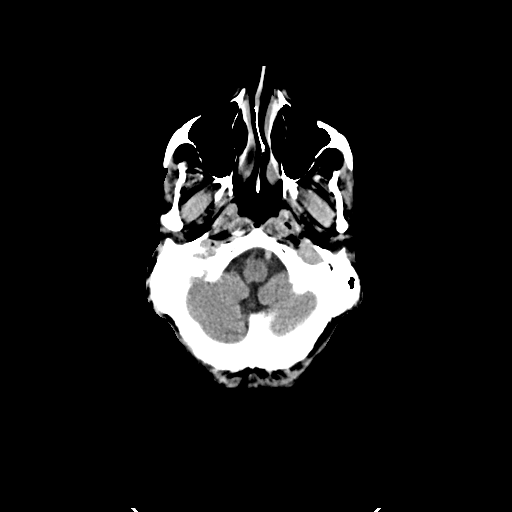
[im 5/33  bone]
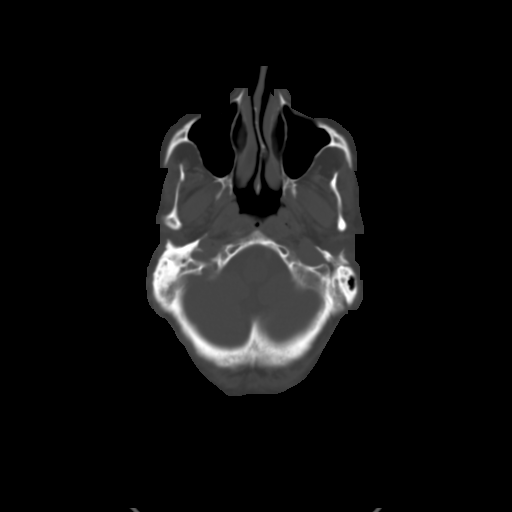
[im 9/33  brain]
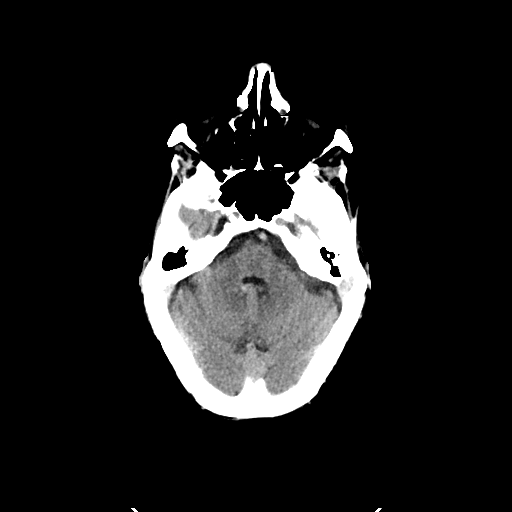
[im 13/33  brain]
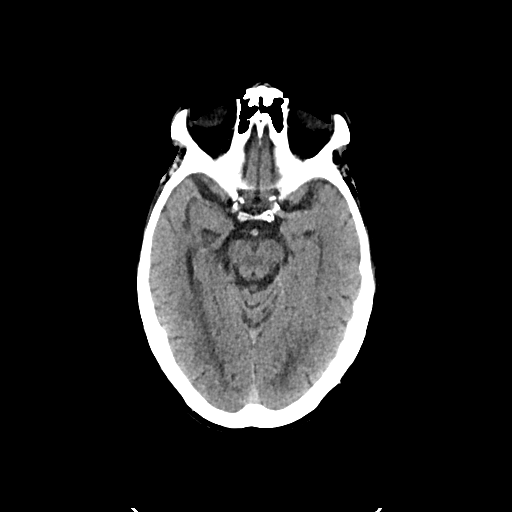
[im 17/33  brain]
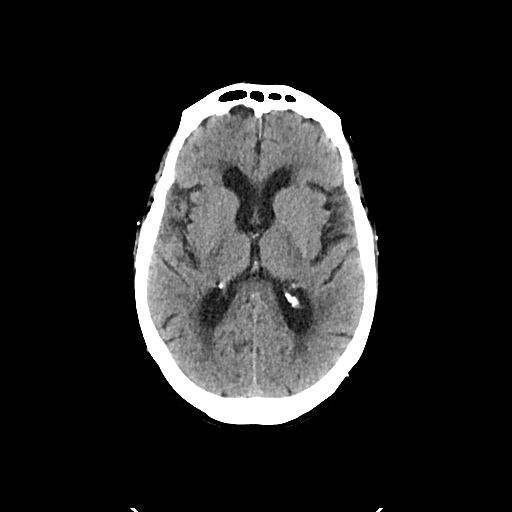
[im 21/33  brain]
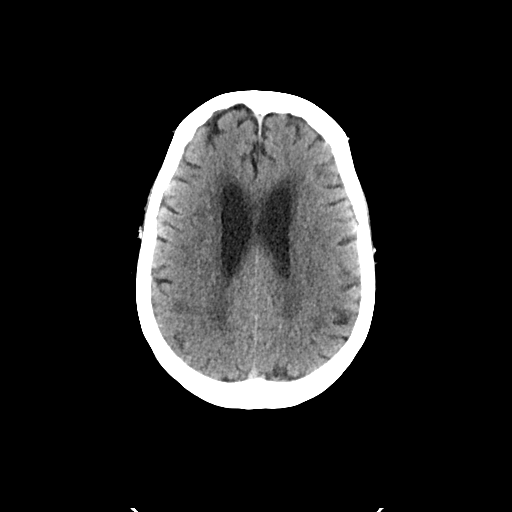
[im 21/33  bone]
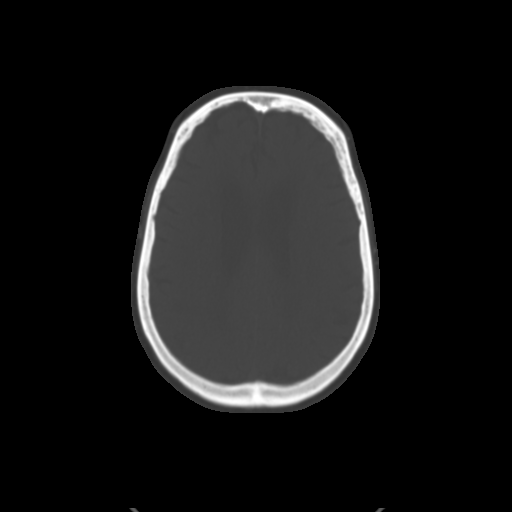
[im 25/33  brain]
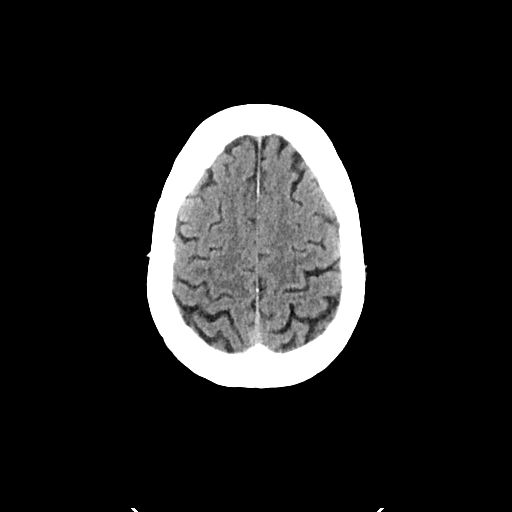
[im 29/33  brain]
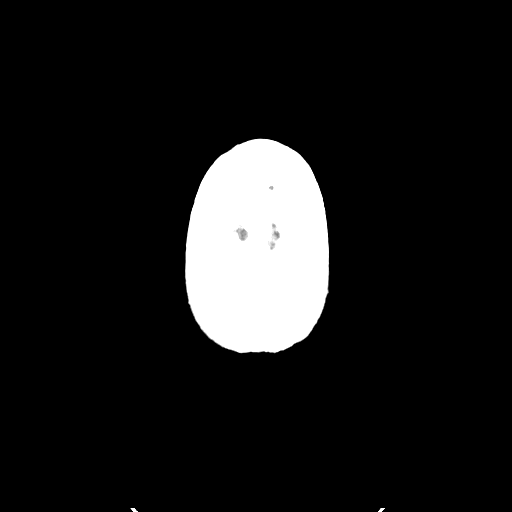

[Series 4: head bone · axial · 0.59mm/px · z∈[-94,-78]mm · 2 of 82 slices shown]
[im 9/82  bone]
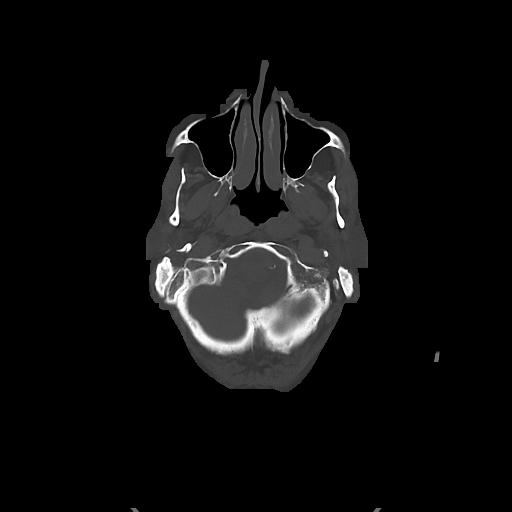
[im 17/82  bone]
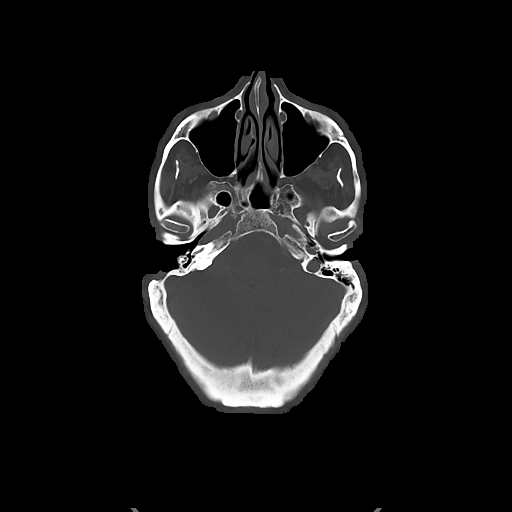

[Series 5: cor soft · coronal · 0.36mm/px · 3 of 77 slices shown]
[im 26/77  brain]
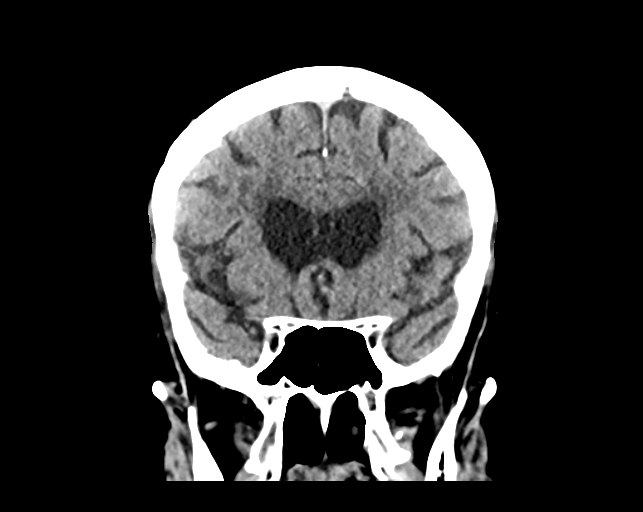
[im 34/77  brain]
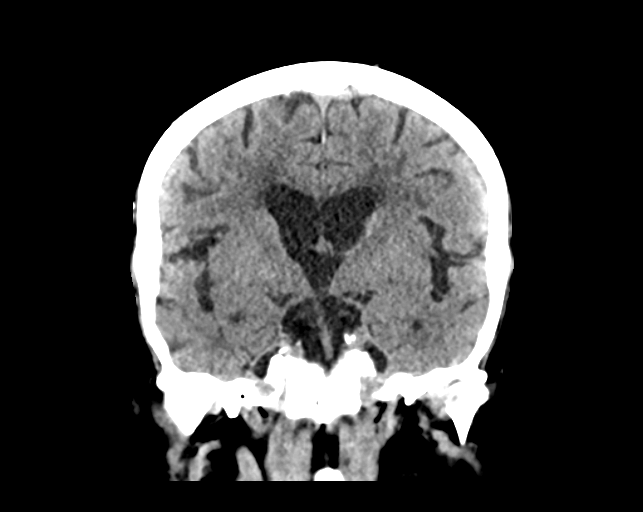
[im 43/77  brain]
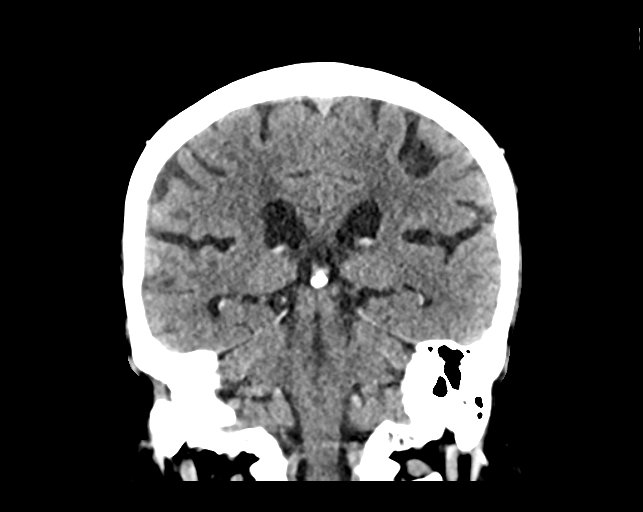

[Series 6: sag soft · sagittal · 0.32mm/px · 3 of 66 slices shown]
[im 22/66  brain]
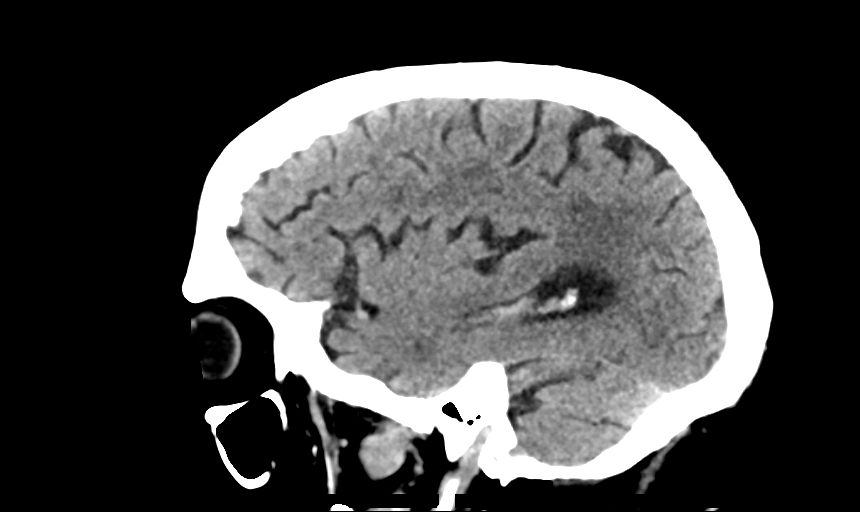
[im 33/66  brain]
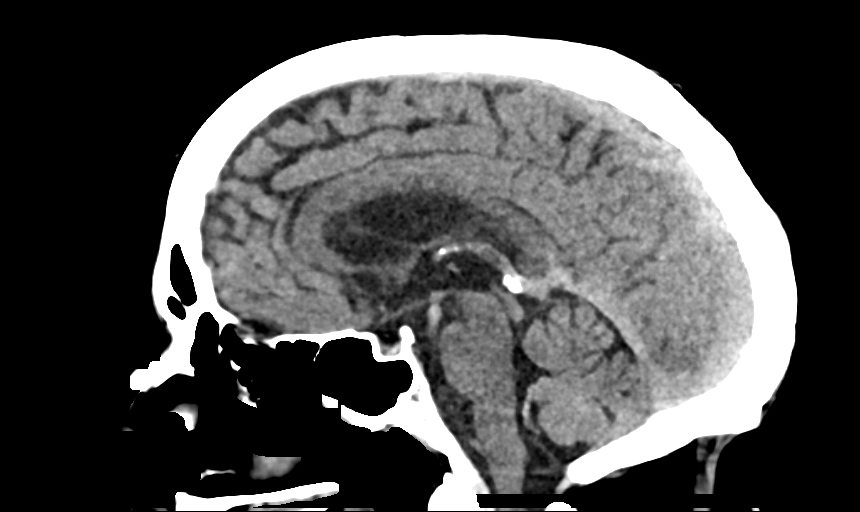
[im 44/66  brain]
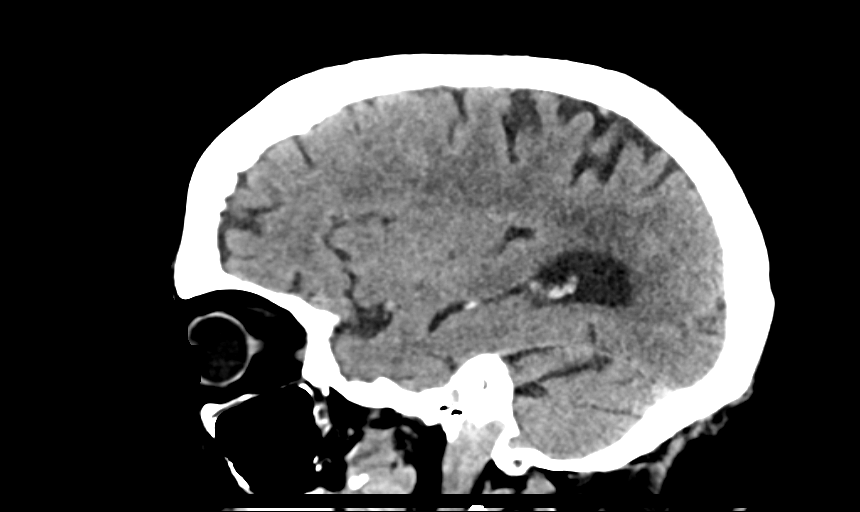

[15 of 47 positions shown; findings below may reference images not displayed]

FINDINGS: Brain: No evidence of acute infarction, hemorrhage, hydrocephalus,
extra-axial collection or mass lesion/mass effect. Hypoattenuation
in the periventricular and subcortical deep white matter compatible
with chronic microvascular ischemic change shows some progression
since the prior exam.

Vascular: Extensive atherosclerosis noted.

Skull: Intact.

Sinuses/Orbits: Negative.

Other: None.
IMPRESSION: No acute abnormality.

Chronic microvascular ischemic change.

Atherosclerosis.

## 2022-01-26 IMAGING — CT CT HEAD W/O CM
4 series · 16 of 47 positions shown, 18 images · non-contrast
Comparison: 04/28/2018

CLINICAL DATA: Found down, right scalp abrasion, anticoagulated

EXAM:
CT HEAD WITHOUT CONTRAST
TECHNIQUE: Contiguous axial images were obtained from the base of the skull
through the vertex without intravenous contrast.

[Series 3: head wo · axial · 0.46mm/px · z∈[-164,-44]mm · 7 of 33 slices shown, 9 images]
[im 5/33  brain]
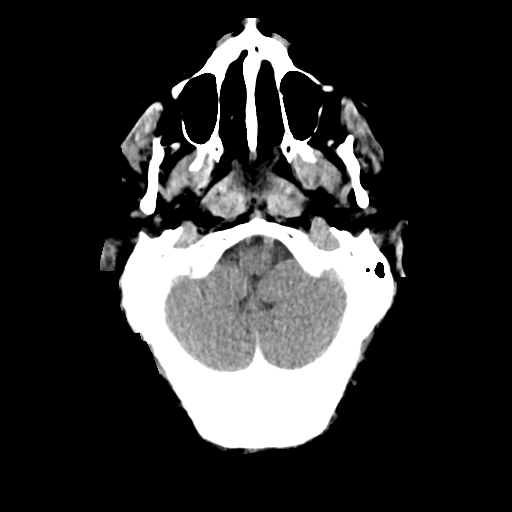
[im 5/33  bone]
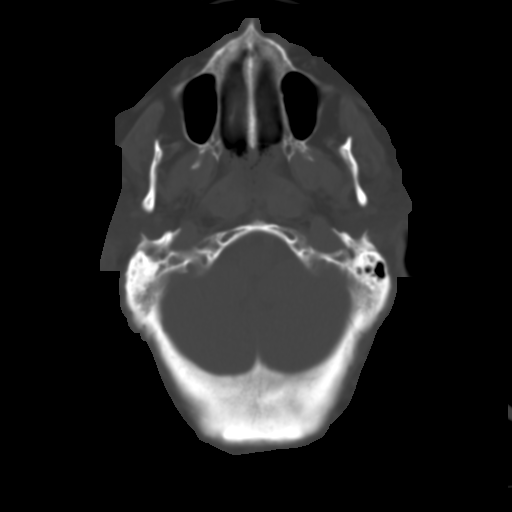
[im 9/33  brain]
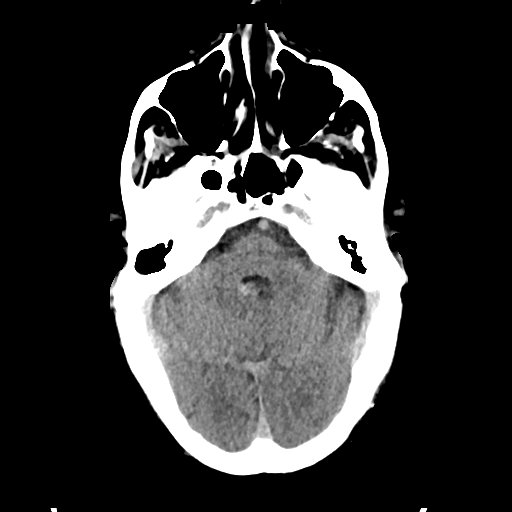
[im 13/33  brain]
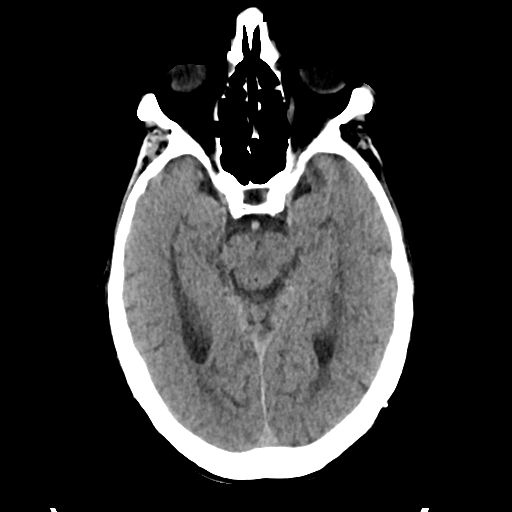
[im 17/33  brain]
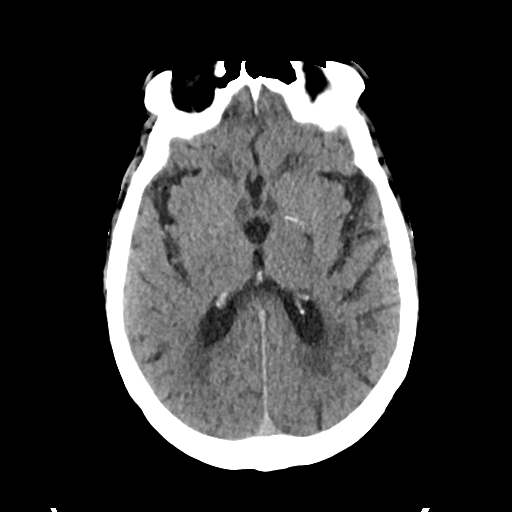
[im 21/33  brain]
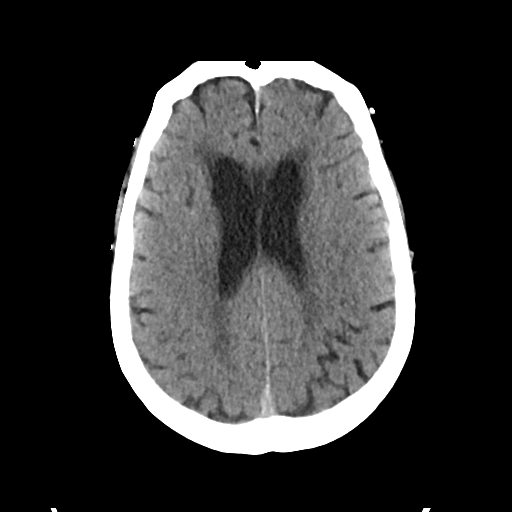
[im 21/33  bone]
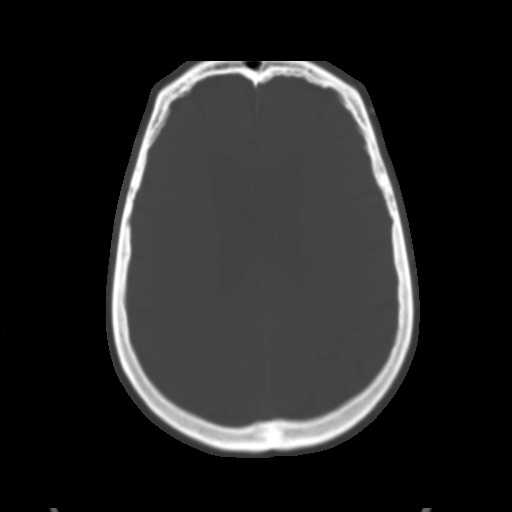
[im 25/33  brain]
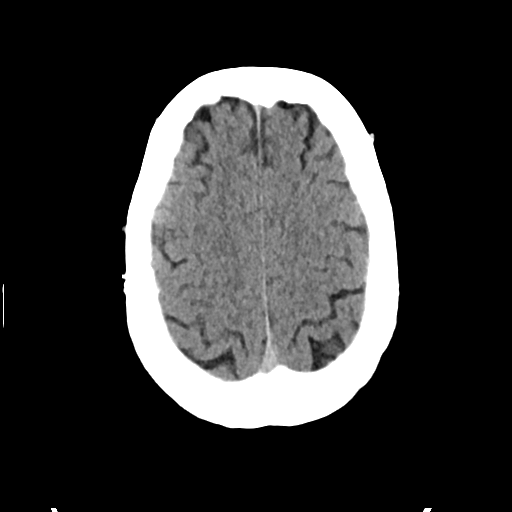
[im 29/33  brain]
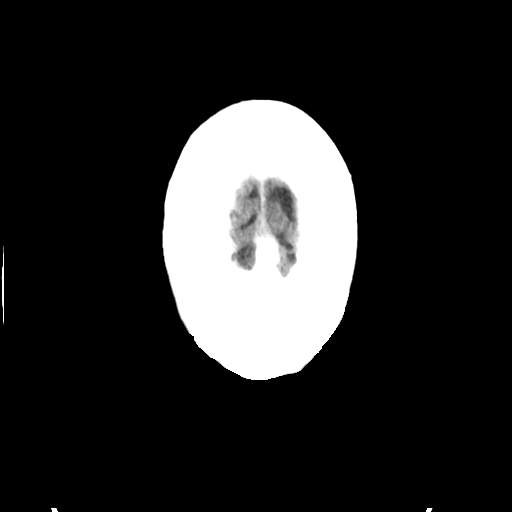

[Series 4: head bone · axial · 0.46mm/px · z∈[-168,-136]mm · 3 of 82 slices shown]
[im 9/82  bone]
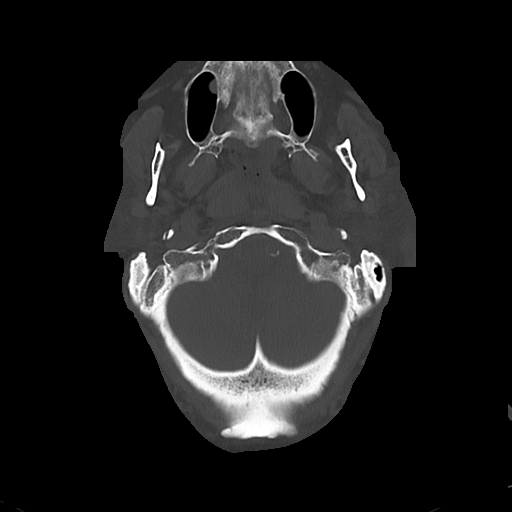
[im 17/82  bone]
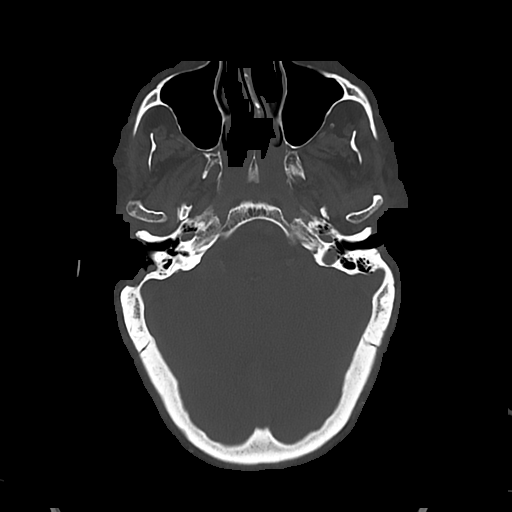
[im 25/82  bone]
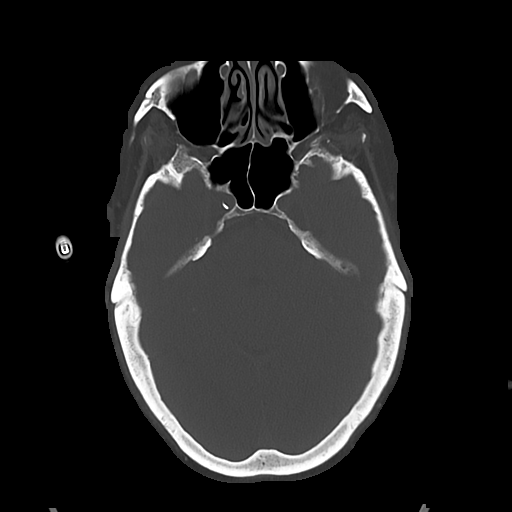

[Series 5: cor soft · coronal · 0.33mm/px · 3 of 79 slices shown]
[im 27/79  brain]
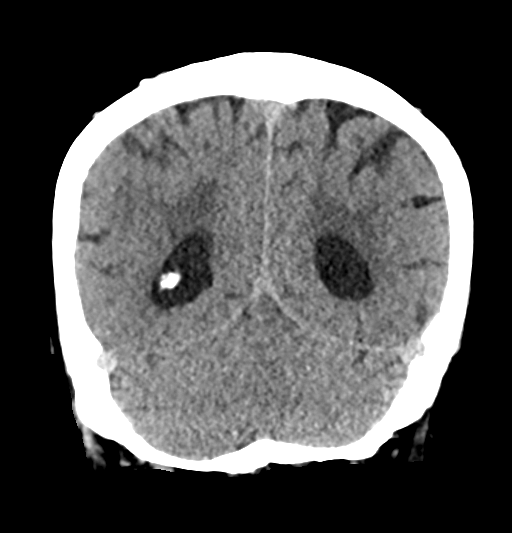
[im 35/79  brain]
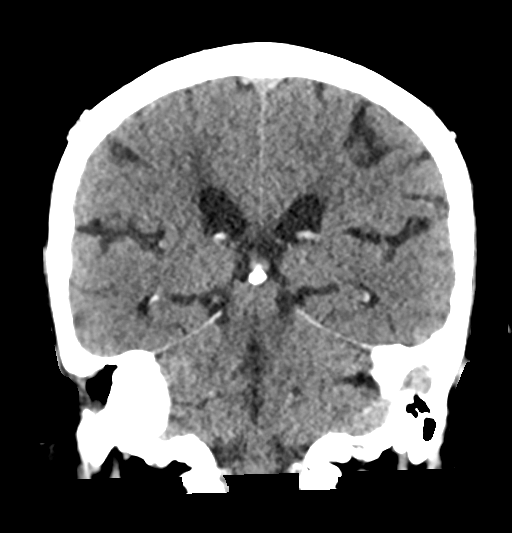
[im 44/79  brain]
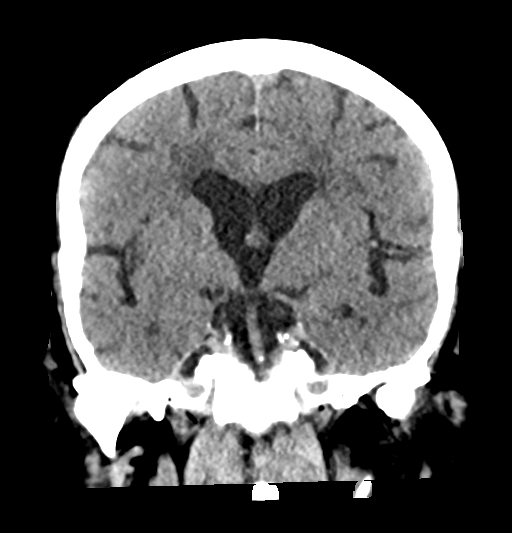

[Series 6: sag soft · sagittal · 0.35mm/px · 3 of 58 slices shown]
[im 20/58  brain]
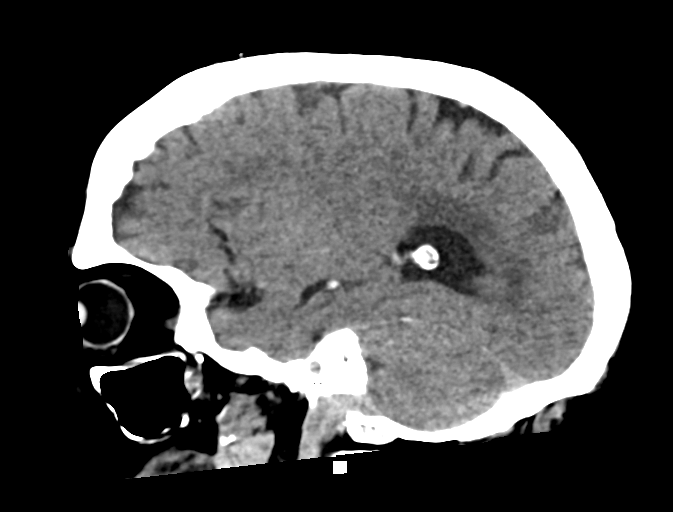
[im 29/58  brain]
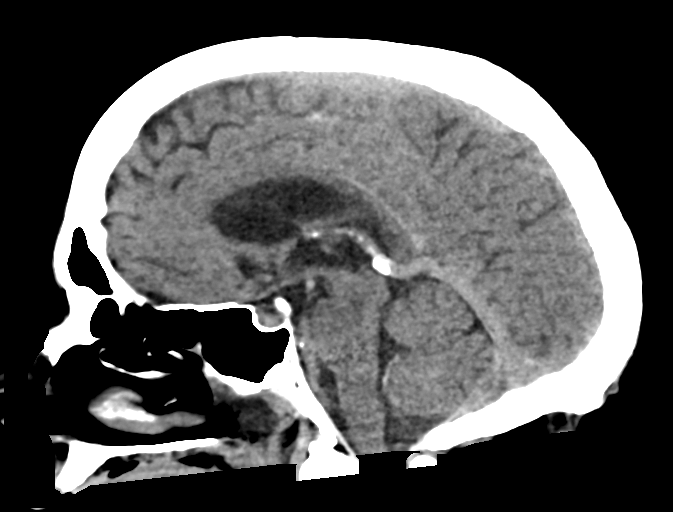
[im 39/58  brain]
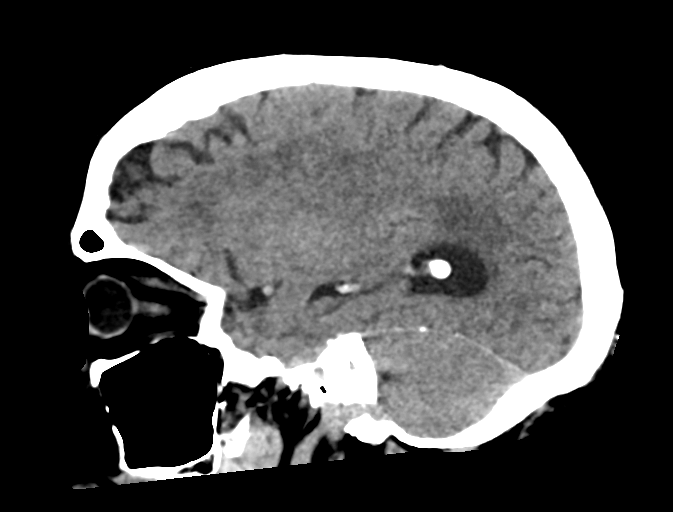

[16 of 47 positions shown; findings below may reference images not displayed]

FINDINGS: Brain: Hypodensities throughout the periventricular white matter are
stable, consistent with chronic small vessel ischemic changes. No
acute infarct or hemorrhage. The lateral ventricles and midline
structures are unremarkable. No acute extra-axial fluid collections.
No mass effect.

Vascular: No hyperdense vessel or unexpected calcification.

Skull: Normal. Negative for fracture or focal lesion.

Sinuses/Orbits: No acute finding.

Other: None
IMPRESSION: 1. Stable chronic small vessel ischemic changes, no acute
intracranial process.
